# Patient Record
Sex: Female | Born: 1965 | Race: White | Hispanic: No | Smoking: Never smoker
Health system: Southern US, Community
[De-identification: ages and names within clinical notes are randomized; demographics above are authoritative.]

## PROBLEM LIST (undated history)

## (undated) DIAGNOSIS — R7303 Prediabetes: Secondary | ICD-10-CM

## (undated) DIAGNOSIS — L80 Vitiligo: Secondary | ICD-10-CM

## (undated) DIAGNOSIS — M7989 Other specified soft tissue disorders: Secondary | ICD-10-CM

## (undated) DIAGNOSIS — Z87442 Personal history of urinary calculi: Secondary | ICD-10-CM

## (undated) DIAGNOSIS — R51 Headache: Secondary | ICD-10-CM

## (undated) DIAGNOSIS — F329 Major depressive disorder, single episode, unspecified: Secondary | ICD-10-CM

## (undated) DIAGNOSIS — J302 Other seasonal allergic rhinitis: Secondary | ICD-10-CM

## (undated) DIAGNOSIS — K829 Disease of gallbladder, unspecified: Secondary | ICD-10-CM

## (undated) DIAGNOSIS — N200 Calculus of kidney: Secondary | ICD-10-CM

## (undated) DIAGNOSIS — M2550 Pain in unspecified joint: Secondary | ICD-10-CM

## (undated) DIAGNOSIS — E785 Hyperlipidemia, unspecified: Secondary | ICD-10-CM

## (undated) DIAGNOSIS — R519 Headache, unspecified: Secondary | ICD-10-CM

## (undated) DIAGNOSIS — I10 Essential (primary) hypertension: Secondary | ICD-10-CM

## (undated) DIAGNOSIS — G4730 Sleep apnea, unspecified: Secondary | ICD-10-CM

## (undated) DIAGNOSIS — F32A Depression, unspecified: Secondary | ICD-10-CM

## (undated) DIAGNOSIS — E559 Vitamin D deficiency, unspecified: Secondary | ICD-10-CM

## (undated) DIAGNOSIS — F419 Anxiety disorder, unspecified: Secondary | ICD-10-CM

## (undated) DIAGNOSIS — I209 Angina pectoris, unspecified: Secondary | ICD-10-CM

## (undated) DIAGNOSIS — J45909 Unspecified asthma, uncomplicated: Secondary | ICD-10-CM

## (undated) DIAGNOSIS — F99 Mental disorder, not otherwise specified: Secondary | ICD-10-CM

## (undated) DIAGNOSIS — R0602 Shortness of breath: Secondary | ICD-10-CM

## (undated) DIAGNOSIS — M549 Dorsalgia, unspecified: Secondary | ICD-10-CM

## (undated) DIAGNOSIS — K219 Gastro-esophageal reflux disease without esophagitis: Secondary | ICD-10-CM

## (undated) HISTORY — DX: Calculus of kidney: N20.0

## (undated) HISTORY — DX: Vitamin D deficiency, unspecified: E55.9

## (undated) HISTORY — PX: APPENDECTOMY: SHX54

## (undated) HISTORY — DX: Vitiligo: L80

## (undated) HISTORY — DX: Mental disorder, not otherwise specified: F99

## (undated) HISTORY — DX: Shortness of breath: R06.02

## (undated) HISTORY — DX: Dorsalgia, unspecified: M54.9

## (undated) HISTORY — DX: Pain in unspecified joint: M25.50

## (undated) HISTORY — DX: Disease of gallbladder, unspecified: K82.9

## (undated) HISTORY — DX: Other seasonal allergic rhinitis: J30.2

## (undated) HISTORY — DX: Essential (primary) hypertension: I10

## (undated) HISTORY — DX: Anxiety disorder, unspecified: F41.9

## (undated) HISTORY — PX: DILATION AND CURETTAGE OF UTERUS: SHX78

## (undated) HISTORY — PX: TUBAL LIGATION: SHX77

## (undated) HISTORY — DX: Sleep apnea, unspecified: G47.30

## (undated) HISTORY — DX: Other specified soft tissue disorders: M79.89

## (undated) HISTORY — DX: Unspecified asthma, uncomplicated: J45.909

---

## 2000-10-04 ENCOUNTER — Other Ambulatory Visit: Admission: RE | Admit: 2000-10-04 | Discharge: 2000-10-04 | Payer: Self-pay | Admitting: Obstetrics and Gynecology

## 2000-11-08 ENCOUNTER — Ambulatory Visit (HOSPITAL_COMMUNITY): Admission: RE | Admit: 2000-11-08 | Discharge: 2000-11-08 | Payer: Self-pay | Admitting: Obstetrics and Gynecology

## 2000-11-08 ENCOUNTER — Encounter: Payer: Self-pay | Admitting: Obstetrics and Gynecology

## 2001-06-26 ENCOUNTER — Ambulatory Visit (HOSPITAL_COMMUNITY): Admission: RE | Admit: 2001-06-26 | Discharge: 2001-06-26 | Payer: Self-pay | Admitting: Obstetrics and Gynecology

## 2002-07-07 ENCOUNTER — Inpatient Hospital Stay (HOSPITAL_COMMUNITY): Admission: EM | Admit: 2002-07-07 | Discharge: 2002-07-10 | Payer: Self-pay | Admitting: Psychiatry

## 2002-11-14 ENCOUNTER — Observation Stay (HOSPITAL_COMMUNITY): Admission: RE | Admit: 2002-11-14 | Discharge: 2002-11-14 | Payer: Self-pay | Admitting: Obstetrics and Gynecology

## 2002-11-14 ENCOUNTER — Encounter: Payer: Self-pay | Admitting: Obstetrics and Gynecology

## 2003-06-12 ENCOUNTER — Ambulatory Visit (HOSPITAL_COMMUNITY): Admission: AD | Admit: 2003-06-12 | Discharge: 2003-06-12 | Payer: Self-pay | Admitting: Obstetrics and Gynecology

## 2003-09-10 ENCOUNTER — Inpatient Hospital Stay (HOSPITAL_COMMUNITY): Admission: RE | Admit: 2003-09-10 | Discharge: 2003-09-12 | Payer: Self-pay | Admitting: Obstetrics and Gynecology

## 2005-10-18 ENCOUNTER — Inpatient Hospital Stay (HOSPITAL_COMMUNITY): Admission: RE | Admit: 2005-10-18 | Discharge: 2005-10-23 | Payer: Self-pay | Admitting: *Deleted

## 2005-10-19 ENCOUNTER — Ambulatory Visit: Payer: Self-pay | Admitting: *Deleted

## 2005-10-26 ENCOUNTER — Ambulatory Visit (HOSPITAL_COMMUNITY): Payer: Self-pay | Admitting: Psychiatry

## 2005-11-02 ENCOUNTER — Ambulatory Visit (HOSPITAL_COMMUNITY): Payer: Self-pay | Admitting: Psychiatry

## 2005-11-08 ENCOUNTER — Ambulatory Visit (HOSPITAL_COMMUNITY): Payer: Self-pay | Admitting: Psychiatry

## 2005-11-16 ENCOUNTER — Ambulatory Visit (HOSPITAL_COMMUNITY): Payer: Self-pay | Admitting: Psychiatry

## 2005-11-20 ENCOUNTER — Ambulatory Visit (HOSPITAL_COMMUNITY): Payer: Self-pay | Admitting: Psychiatry

## 2005-11-27 ENCOUNTER — Ambulatory Visit (HOSPITAL_COMMUNITY): Payer: Self-pay | Admitting: Psychiatry

## 2005-12-01 ENCOUNTER — Ambulatory Visit (HOSPITAL_COMMUNITY): Admission: RE | Admit: 2005-12-01 | Discharge: 2005-12-01 | Payer: Self-pay | Admitting: Obstetrics and Gynecology

## 2005-12-07 ENCOUNTER — Ambulatory Visit (HOSPITAL_COMMUNITY): Payer: Self-pay | Admitting: Psychiatry

## 2005-12-28 ENCOUNTER — Ambulatory Visit (HOSPITAL_COMMUNITY): Payer: Self-pay | Admitting: Psychiatry

## 2006-01-03 ENCOUNTER — Ambulatory Visit (HOSPITAL_COMMUNITY): Payer: Self-pay | Admitting: Psychiatry

## 2006-01-24 ENCOUNTER — Ambulatory Visit (HOSPITAL_COMMUNITY): Payer: Self-pay | Admitting: Psychiatry

## 2006-02-15 ENCOUNTER — Ambulatory Visit (HOSPITAL_COMMUNITY): Payer: Self-pay | Admitting: Psychiatry

## 2006-03-08 ENCOUNTER — Ambulatory Visit (HOSPITAL_COMMUNITY): Payer: Self-pay | Admitting: Psychiatry

## 2006-03-29 ENCOUNTER — Ambulatory Visit (HOSPITAL_COMMUNITY): Payer: Self-pay | Admitting: Psychiatry

## 2006-12-28 ENCOUNTER — Ambulatory Visit (HOSPITAL_COMMUNITY): Admission: RE | Admit: 2006-12-28 | Discharge: 2006-12-28 | Payer: Self-pay | Admitting: Obstetrics and Gynecology

## 2008-01-22 ENCOUNTER — Other Ambulatory Visit: Admission: RE | Admit: 2008-01-22 | Discharge: 2008-01-22 | Payer: Self-pay | Admitting: Obstetrics and Gynecology

## 2008-01-22 ENCOUNTER — Ambulatory Visit (HOSPITAL_COMMUNITY): Admission: RE | Admit: 2008-01-22 | Discharge: 2008-01-22 | Payer: Self-pay | Admitting: Obstetrics and Gynecology

## 2009-03-04 ENCOUNTER — Other Ambulatory Visit: Admission: RE | Admit: 2009-03-04 | Discharge: 2009-03-04 | Payer: Self-pay | Admitting: Obstetrics and Gynecology

## 2009-08-20 ENCOUNTER — Ambulatory Visit (HOSPITAL_COMMUNITY): Admission: RE | Admit: 2009-08-20 | Discharge: 2009-08-20 | Payer: Self-pay | Admitting: Obstetrics and Gynecology

## 2010-06-05 ENCOUNTER — Encounter: Payer: Self-pay | Admitting: Obstetrics and Gynecology

## 2010-09-30 NOTE — Discharge Summary (Signed)
Heidi Payne, Heidi Payne NO.:  [id number]   MEDICAL RECORD NO.:  [id number]          PATIENT TYPE:  IPS   LOCATION:  0502                          [facility]ACILITY[facility]  [facility]   PHYSICIAN:  Jasmine Pang, M.D. DATE OF BIRTH:  November 01, 1965   DATE OF ADMISSION:  10/18/2005  DATE OF DISCHARGE:  10/23/2005                                 DISCHARGE SUMMARY   IDENTIFICATION:  The patient is a 45 year old married Caucasian female who  was admitted on an involuntary basis on October 18, 2005.   HISTORY OF PRESENT ILLNESS:  The patient had a history of an intentional  overdose.  She then wanted to jump off a bridge.  She overdosed on 11 Xanax  (0.25 mg size).  The chart indicates that she climbed over the railing and  was hanging from the bridge.  She states she lost her nerve and came back  over.  She then went down below the bridge and laid down.  She had left a  suicide note from her family.  The police were called and found the patient  out walking on the side of the road.  The trigger was patient lost job prior  to admission due to their discovery of her embezzlement of some money.  Her  husband got very upset and was agitated with her.  He told her he would  leave her, according to patient.  At this point, she felt suicidal.   PAST PSYCHIATRIC HISTORY:  This is the second admission here.  She was here  approximately three years ago.  She has had no previous history of suicide  attempt.   ALCOHOL/DRUG HISTORY:  The patient is a nonsmoker.  She does not use alcohol  or drugs.   MEDICAL HISTORY:  The patient had a history of dyslipidemia.   MEDICATIONS:  She is on Xanax 0.25 mg p.o. p.r.n. and Celexa 20 mg daily.   ALLERGIES:  She has no known drug allergies.   ADMISSION DIAGNOSIS:  Depression not otherwise specified.   PHYSICAL EXAMINATION:  These were done at [facility].  Please see their report.  There were no acute abnormal findings and she was  physically  stable.   LABORATORY DATA:  These were done at [facility].  Please see  their report.  There were no acute abnormal findings and she was physically  stable.   HOSPITAL COURSE:  Upon admission, the patient was continued on her Celexa 20  mg q.d. and Vytorin 10/20 mg daily.  Upon admission, the patient was started  on Ambien 10 mg p.o. p.r.n.  On October 19, 2005, she was started on Librium 25  mg now, q.6h. p.r.n. and Celexa 20 mg daily as indicated above.  On October 20, 2005, she was started on Motrin 400 mg p.o. q.6h. p.r.n. pain.  On October 21, 2005, Celexa was increased to 40 mg daily to start the following day.   Upon my first meeting with patient, on October 19, 2005, she stated she was here  for suicidal ideation and depression.  She was suspended from her job after  having embezzled some money.  She had hoped she could go back to work but  was recently told she had lost her job.  She stated her husband was very  upset and they argued.  She became increasingly suicidal and, at 4 a.m.,  walked to the bridge and almost jumped off.  She had taken 10 Xanax pills  also.  On October 20, 2005, the patient continued to be depressed and ashamed  about her situation.  She states she was going to tell her supervisor about  the embezzlement but he found out first.  She feels she has let her  supervisor down because she was always treated so well by him.  She also  feels ashamed at her husband knowing what she did.  She was heartened by the  fact that her supervisor put her back on the payroll and did not let her go  so she could apply for the disability benefits.  This improved her mood  somewhat.  She stated her mother was in the hospital now for severe asthma  and other medical problems.  On October 21, 2005, the patient's mood continued  to improve.  She was previously on Lexapro but had to change for financial  reasons to Celexa.  She reported no more suicidal thoughts but continued to  have  anxiety and depressive symptoms.  On October 22, 2005, the patient stated  she was feeling much better.  There was no suicidal thoughts in the past 24  hours.  She and her husband were ready to cope with their financial  situation.  The patient feels positive about the future and a relationship  with him.   On October 22, 2005, the patient and patient's husband and the patient's  daughter met with our social worker for a family session.  The main concern  addressed was stress reduction and continuing care.  The patient requested  to attend an IOP and spoke of one in [place] close to her home.  She  spoke about the need for counseling, to continue to look at her childhood  issues and help her reduce stress and increase her self-esteem.  She  requested that, if placement in an IOP in [place] cannot be made, she be  referred to a therapist that she can see 2-3 times a week.  The patient does  not currently have a doctor for medication management and will need a  referral for one in her area.  She stated that she felt good on her current  medications and felt the medications were helping.  The patient's husband  and daughter discussed a plan for the patient to go to a neighbor's house  for most of the day the first few weeks so she would not be alone.  The  patient spoke highly of this neighbor and stated she could talk to her if  she was feeling overwhelmed.  The patient and patient's husband also spoke  about a plan to work on their communication skills which was one of the  triggers leading up to the patient's admittance.  They denied referral for  family and marriage counseling but stated a detailed plan to increase  communication that involved taking time-outs, paraphrasing each other and  not letting issues remain unsolved.  The family appeared to be very close  and supportive and patient spoke about feeling like she could go home the following day.   On October 23, 2005, the patient's  mental status had improved  markedly.  She  had no psychomotor retardation.  Eye contact was good.  She was friendly and  cooperative with speech normal rate and flow.  The patient was less  depressed and anxious.  There was no suicidal ideation.  No self-injurious  behavior.  No auditory or visual hallucinations.  No paranoia or delusions.  Thoughts were logical and goal directed.  Thought content with no  predominant theme.  Cognitive exam was grossly within normal limits.  She  felt heartened by her family session and felt her husband was very  supportive.  He was planning to pick her up today.  She will go home to live  with her family.   DISCHARGE DIAGNOSES:  AXIS I:  Major depression, recurrent, severe without  psychosis.  AXIS II:  None.  AXIS III:  Hyperlipidemia.  AXIS IV:  Severe (economic problem, occupational problem, problems related  to the social environment, problems related to legal system if they press  charges for the embezzlement).  AXIS V:  GAF upon discharge 40; GAF upon admission 28; GAF highest past year  65.   ACTIVITY/DIET:  There were no specific activity level or dietary  restrictions.   DISCHARGE MEDICATIONS:  1.  Celexa (citalopram) 40 mg daily.  2.  Vytorin 10/20 mg daily.  3.  Ambien 10 mg p.o. q.h.s.   POST-HOSPITAL CARE PLANS:  The patient will be followed up by Dr. Lolly Mustache at  the [facility] in [place].  She has  an appointment on Thursday, November 16, 2005 at 10:45 a.m.  She will also be  followed by Florencia Reasons on Thursday, October 26, 2005 at 9 a.m.      Jasmine Pang, M.D.  Electronically Signed     BHS/MEDQ  D:  11/07/2005  T:  11/07/2005  Job:  [id number]

## 2010-09-30 NOTE — Discharge Summary (Signed)
NAME:  Heidi Payne, Heidi Payne                     ACCOUNT NO.:  [id number]   MEDICAL RECORD NO.:  [id number]                   PATIENT TYPE:  INP   LOCATION:  A409                                 FACILITY:  [facility]   PHYSICIAN:  [name], M.D.                DATE OF BIRTH:  1965/08/15   DATE OF ADMISSION:  DATE OF DISCHARGE:  09/11/2001                                 DISCHARGE SUMMARY   DISCHARGE DIAGNOSES:  1. Status post primary cesarean section.  2. Desires sterilization.  3. Unremarkable postoperative course.   PROCEDURES:  Primary cesarean section with tubal ligation.   Please refer to the transcribed history and physical and the operative  report for details of admission to the hospital.   HOSPITAL COURSE:  The patient had an unremarkable postoperative course.  She  tolerated clear liquids and a regular diet.  She voided without symptoms and  was ambulatory.  Her incision was clean, dry and intact.  On postoperative  day #1, hemoglobin was 12, hematocrit was 35.5.  White count was 7200.  She  tolerated clear liquids, regular diet, voiding without symptoms, and was  ambulatory.  She is discharged to home on the morning of postoperative day  #2 in stable condition.   FOLLOWUP:  In the office on Wednesday, May 5, for staple removal.   DISCHARGE INSTRUCTIONS:  She was given Tylox and Motrin for pain and  instructions and precautions for return prior to that time.     ___________________________________________                                         [name], M.D.   Loraine Maple  D:  09/12/2003  T:  09/12/2003  Job:  [id number]

## 2010-09-30 NOTE — Op Note (Signed)
[facility]  Patient:    Heidi Payne, Heidi Payne Visit Number: [id number] MRN: [id number]          Service Type: DSU Location: DAY Attending Physician:  [name] Dictated by:   Christin Bach, M.D. Proc. Date: 06/26/01 Admit Date:  06/26/2001 Discharge Date: 06/26/2001                             Operative Report  ADDENDUM TO JOB #[id number]  Patients blood type is AB-.  Husband is Rh negative as well.  Review of the chart indicates that this was confirmed at the time of the surgical procedure. RhoGAM was therefore not indicated and not given. Dictated by:   Christin Bach, M.D. Attending Physician:  [name] DD:  07/14/01 TD:  07/15/01 Job: [id number]

## 2010-09-30 NOTE — Discharge Summary (Signed)
NAME:  Heidi Payne, Heidi Payne                     ACCOUNT NO.:  [id number]   MEDICAL RECORD NO.:  [id number]                   PATIENT TYPE:  IPS   LOCATION:  0505                                 [facility]ILITY[facility]   PHYSICIAN:  Jeanice Lim, M.D.              DATE OF BIRTH:  Dec 06, 1965   DATE OF ADMISSION:  07/07/2002  DATE OF DISCHARGE:  07/10/2002                                 DISCHARGE SUMMARY   IDENTIFYING DATA:  This is a 45 year old Caucasian female, married,  voluntarily admitted, receptionist.  Referred by her physician for suicidal  thoughts with plan to overdose.  The patient has a 48 year old daughter.  The patient wanted a second child but husband would not agree.  The patient  is grieving over the loss of a pregnancy and was quite upset that her  husband was not willing to have another child and could not get over this.   MEDICATIONS:  Lexapro, oral contraceptives.   ALLERGIES:  No known drug allergies.   PHYSICAL EXAMINATION:  Essentially within normal limits.  Neurologically  nonfocal.   LABORATORY DATA:  Routine admission labs essentially within normal limits.   MENTAL STATUS EXAM:  Healthy, fully alert female with blunted affect,  psychomotor slowing.  Speech within normal limits.  Mood depressed.  Affect  restricted.  Thought processes goal directed.  Thought content negative for  dangerous ideation or psychotic symptoms.   ADMISSION DIAGNOSES:   AXIS I:  Major depressive disorder, recurrent, severe.   AXIS II:  None.   AXIS III:  Urinary tract infection.   AXIS IV:  Moderate (problems with primary support group).   AXIS V:  30/70.   HOSPITAL COURSE:  The patient was admitted and ordered routine p.r.n.  medications and underwent further monitoring.  Was encouraged to participate  in individual, group and milieu therapy.  Possible UTI was treated with  Septra and patient was started on trazodone to restore sleep and Xanax  p.r.n. anxiety.  The  patient was quite relieved when husband agreed that  they could work on having another child.  The patient reported improvement  in mood and no longer was suicidal.  The patient's ability to cope with  stress and disappointment was discussed and she was motivated to work on  healthier coping skills for future stressors and disappointments.   CONDITION ON DISCHARGE:  Markedly improved.  Mood was more euthymic.  Affect  brighter.  Thought processes goal directed.  Thought content negative for  dangerous ideation or psychotic symptoms.   DISCHARGE MEDICATIONS:  1. Lexapro 20 mg q.a.m.  2. Bactrim DS b.i.d. for two days.  3. Trazodone 50 mg q.h.s.  4. Ambien 10 mg q.h.s. p.r.n. insomnia.  5. Xanax 0.25 mg q.6h. p.r.n. anxiety.   FOLLOW UP:  The patient was to follow up with Leeanne Mannan, therapist, on  July 14, 2002 at 11 a.m. and Dr. Lourdes Sledge on July 17, 2002 at  2:15 p.m.   DISCHARGE DIAGNOSES:   AXIS I:  Major depressive disorder, recurrent, severe.   AXIS II:  None.   AXIS III:  Urinary tract infection.   AXIS IV:  Moderate (problems with primary support group).   AXIS V:  Global Assessment of Functioning on discharge 55.                                                Jeanice Lim, M.D.    JEM/MEDQ  D:  07/21/2002  T:  07/22/2002  Job:  [id number]

## 2010-09-30 NOTE — Op Note (Signed)
[facility]  Patient:    Heidi Payne, Heidi Payne Visit Number: [id number] MRN: [id number]          Service Type: DSU Location: DAY Attending Physician:  [name] Dictated by:   Christin Bach, M.D. Proc. Date: 06/26/01 Admit Date:  06/26/2001 Discharge Date: 06/26/2001                             Operative Report  PREOPERATIVE DIAGNOSES:  Missed abortion.  POSTOPERATIVE DIAGNOSES:  Missed abortion.  PROCEDURE:  Suction dilatation and curettage.  SURGEON:  Dr. Emelda Fear  ASSISTANT:  None.  ANESTHESIA:  General LMA.  COMPLICATIONS:  None.  FINDINGS:  Uterus sounded to 10 cm.  DETAILS OF PROCEDURE:  Patient was taken to the operating room, anesthetized, prepped and draped for vaginal procedure.  The speculum was inserted.  Cervix prepped.  Single tooth tenaculum used to grasp the cervix which was sounded to a depth of 10 cm, dilated to 29 [place] allowing introduction of a 9 mm curved suction curette which resulted in circumferential suction curettage removing appropriate blood, tissue, and fluid consistent with missed AB.  Patients blood type was confirmed as AB- and RhoGAM authorized.  The patient went to recovery room in good condition. Dictated by:   Christin Bach, M.D. Attending Physician:  [name] DD:  07/14/01 TD:  07/15/01 Job: [id number]

## 2010-09-30 NOTE — H&P (Signed)
[name], [name] NO.:  [id number]   MEDICAL RECORD NO.:  [id number]          PATIENT TYPE:  IPS   LOCATION:  0502                          [facility]ILITY[facility]   PHYSICIAN:  Heidi Payne, M.D. DATE OF BIRTH:  May 18, 1965   DATE OF ADMISSION:  10/18/2005  DATE OF DISCHARGE:                         PSYCHIATRIC ADMISSION ASSESSMENT   IDENTIFYING INFORMATION:  The patient is a 45 year old married white female  involuntarily committed on October 18, 2005.   HISTORY OF PRESENT ILLNESS:  The patient presents here on petition with a  history of intentional overdose and, after overdosing, wanted to jump off a  bridge.  The patient states she overdosed on 11 Xanax 0.25 mg.  Petition  papers state that the patient crossed over the railing of the bridge, was  hanging on, lost her nerve and came back over.  The patient states that she  did overdose in the middle of the night.  She left a note for her family.  She states that, after she overdosed, she had laid by the bridge.  They were  unable to find her for several hours.  Police were called and found the  patient walking back alongside the road.  The patient states that her  trigger was that she lost her job due to embezzlement.  The patient was  working in a [employer], trade [employer].  She stated her husband got very upset at her  about what had happened.  She did not confide in him and stating he would  leave her, again, which was the trigger for her overdose.   PAST PSYCHIATRIC HISTORY:  Second visit to [facility].  Was  here about three years ago.  No history of any prior suicide attempts.   SOCIAL HISTORY:  The patient is a 45 year old married, white female.  Has  two children, age 54 and 2.  Finished 14 years of school.  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  No current legal charges due  to the embezzlement.  States that she believes all she will have to do is  pay back the money.   FAMILY  HISTORY:  Denies.   ALCOHOL/DRUG HISTORY:  Nonsmoker.  Denies any alcohol or drug use.   PRIMARY CARE PHYSICIAN:  Dr. Dimas Aguas.   MEDICAL PROBLEMS:  Hyperlipidemia.   MEDICATIONS:  Xanax 0.25 mg p.r.n., Celexa 20 mg and Vytorin 10/20 mg daily.   PHYSICAL EXAMINATION:  The patient was fully assessed at [facility].  She is a middle-aged female.  Appears in no acute distress.  Well-nourished.  Temperature 97.9, heart rate 90, respirations 14, blood pressure 119/77,  height 5 feet 5-1/2 inches tall, weight 155 pounds.   LABORATORY DATA:  Urine drug screen was positive for benzodiazepines.  Alcohol level less than 5.  CBC within normal limits.  Alcohol level less  than 5.  Salicylate level less than 4.  Acetaminophen level less than 10.  Pregnancy test is negative.  Potassium is 3.4.   MENTAL STATUS EXAM:  Middle-aged female, casually dressed, neat in  appearance.  Cooperative.  Fair eye contact.  Speech is  clear, normal rate  and tone.  The patient feels guilty.  The patient is sad, very pleasant.  Thought processes are coherent.  No evidence of psychosis.  Cognitive  function intact.  Memory is good.  Judgment is fair.  Insight is fair.  She  appears sincere.   DIAGNOSES:  AXIS I:  Depressive disorder not otherwise specified.  AXIS II:  Deferred.  AXIS III:  Hyperlipidemia, status post Xanax overdose.  AXIS IV:  Problems with primary support group, occupation and economic  issues.  AXIS V:  Current 30.   PLAN:  To stabilize mood and thinking.  Will have Librium available p.r.n.  for withdrawal symptoms.  Will have a family session with husband.  Will  continue with her Celexa.   TENTATIVE LENGTH OF STAY:  Four to six days.      Heidi Payne, N.P.      Heidi Payne, M.D.  Electronically Signed    JO/MEDQ  D:  10/20/2005  T:  10/20/2005  Job:  [id number]

## 2010-09-30 NOTE — H&P (Signed)
Heidi Payne, Heidi Payne                     ACCOUNT NO.:  [id number]   MEDICAL RECORD NO.:  [id number]                   PATIENT TYPE:  IPS   LOCATION:  0505                                 FACILITY:  [facility]   PHYSICIAN:  Jeanice Lim, M.D.              DATE OF BIRTH:  November 25, 1965   DATE OF ADMISSION:  07/07/2002  DATE OF DISCHARGE:  07/10/2002                         PSYCHIATRIC ADMISSION ASSESSMENT   DATE OF ASSESSMENT:  July 08, 2002   PATIENT IDENTIFICATION:  This is a 45 year old white female who is married,  voluntary admission.   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  She has a 24 year old daughter and wants a  second child but her husband will not agree.  She had a miscarriage  approximately one year ago and is grieving the loss of that pregnancy.  Her  husband had promised her that even though she had miscarried that previous  pregnancy, he would help her get pregnant again and then after she recovered  from the miscarriage, he denied her wishes.  The patient reports that she  has become gradually depressed with symptoms worse in the past two weeks.  She sleeps constantly when she is not working and tends to spend all day or  all weekend in the bed just lying around.  At the same time, she feels that  her concentration is poor.  She has episodes of anxiety with mild agitation  and anger when she is around her husband.  She denies any overt panic  attacks or auditory or visual hallucinations, denies any feelings of  paranoia.  Over the last one to two weeks, she has entertained thoughts of  possibly killing herself by overdosing on medications.  She denies any  homicidal ideation.   PAST PSYCHIATRIC HISTORY:  The patient was seen once with a counselor but  had not psychiatric treatment in the past.  This is her first  inpatient  admission.  She was treated once for depression in 1998 with Paxil, which,  she reports, gave her good results but caused considerable weight gain.  She  has no history of mania, mood fluctuation, or prior suicide attempts.   SUBSTANCE ABUSE HISTORY:  The patient denies any history of substance abuse,  has no evidence of substance abuse.   PAST MEDICAL HISTORY:  The patient is followed by [name], M.D.,  who is her OB/GYN in [place].  Medical problems: She denies.  She reports  that she is healthy.   MEDICATIONS:  1. Lexapro 20 mg daily.  2. The patient takes oral contraceptive tablets.   DRUG ALLERGIES:  None.  The patient is allergic to cats, which causes  anaphylaxis.   REVIEW OF SYSTEMS:  Review of systems is remarkable for a dull headache,  which she scores as a 3-4 on  scale of 10.  She has had some episodic left  substernal chest pain since she has been admitted here to the hospital,  although she does not have it currently.  She has had it in the past with a  negative cardiac workup.   PHYSICAL EXAMINATION:  GENERAL:  This is a healthy appearing female who  appears to be her stated age of 55.  VITAL SIGNS:  On admission to the unit, temperature 97.3, pulse 92,  respirations 20, blood pressure 136/89 sitting, 143/84 standing.  She is 5  feet 7 inches tall approximately and weighs about 164 pounds.  SKIN:  Her skin is pale in tone with no remarkable features, no tattoos or  remarkable scars.  HEENT:  Head is normocephalic and atraumatic.  Grooming is satisfactory.  EENT: PERRLA.  Sclerae are nonicteric.  Lenses appear clear.  No funduscopic  done.  Hearing intact to normal voice.  No rhinorrhea.  Oropharynx: In good  condition.  NECK:  Supple, no thyromegaly.  CARDIOVASCULAR:  Heart sounds are grossly normal.  No extra sounds  appreciated.  Apical pulse is synchronous with radial pulse.  Regular rate.  EXTREMITIES:  Extremities are pink and warm with  good capillary refill, no  evidence of edema.  LUNGS:  Clear to auscultation.  ABDOMEN:  Slightly rounded, soft, nontender, no masses appreciated.  GENITALIA:  Deferred.  BREAST:  Exam is deferred.  MUSCULOSKELETAL:  No swelling or erythema of any joints.  NEUROLOGIC:  Cranial nerves II-XII are intact.  Facial symmetry is present.  Grip strength is equal bilaterally.  EOM are intact without nystagmus.  Gait  is smooth.  Motor movements are smooth without tremor.  Sensory is grossly  intact.  Romberg: Without findings.  No focal findings.   LABORATORY DATA:  Diagnostic studies reveal the patient with cloudy urine  with 3-6 wbc's per high power field along with trace blood and ketones.  Her  CBC is within normal limits with a hemoglobin 14.1, hematocrit 41.3, WBC  4800, MCV 85.9, platelets 246,000.  Routine chemistry reveals normal  electrolytes, BUN 12, creatinine 0.9.  Liver enzymes are within normal  limits.   SOCIAL HISTORY:  The patient has been married for the past eighteen years  and has a 15 year old daughter.  She lives in [place] with her husband and her  daughter.  There is some financial stress but nothing that she feels is  significant.  She endorses chronic marital conflict around the issues of  having second child.  She denies any legal problems.   FAMILY HISTORY:  Family history is unremarkable.   MENTAL STATUS EXAM:  This is a healthy appearing female who is fully with a  blunted and tearful affect with some very mild psychomotor slowing but  otherwise a normal motor exam.  She is cooperative, pleasant, and polite.  Speech is normal but somewhat soft in tone but within normal limits.  She is  articulate, spontaneous.  Mood is depressed and somewhat hopeless about the  conflict with her husband.  Thought process is logical, goal directed, and  is positive for suicidal ideation with a possible plan to overdose on medications but no clear intent at this point; no evidence  of homicidal  ideation.  Although she is quite frustrated with her husband, she is clear  that she loves him and just wants to resolve their conflict.  Cognitive:  Intact and oriented x 3.  Intelligence is above average.  Judgment and  impulse control:  Within normal limits.  Insight: Satisfactory.   ADMISSION DIAGNOSES:   AXIS I:  Major depressive disorder, recurrent, severe.   AXIS II:  No diagnosis.   AXIS III:  Urinary tract infection.   AXIS IV:  Severe marital conflict.   AXIS V:  Current 34, past year 64.   INITIAL PLAN OF CARE:  Plan is to voluntarily admit the patient to alleviate  her depression with the goal of alleviating her suicidal ideation and  improving her sleep-wake cycle and her energy and alleviating her anhedonia.  We are going to get a thyroid panel and will add a pregnancy test to that.  Along with that, because she has had some episodes of chest pain, we will  get an EKG and do cardiac enzymes to rule out any problems since she does  have some persistent, intermittent chest pain.  We will place her on Septra  DS p.o. b.i.d. for five days for her UTI and start her on Xanax 0.25 mg  q.6.h. p.r.n. for her depression and anxiety.  We will continue her on  Lexapro 20 mg p.o. q.a.m. and will add trazodone 50 mg p.o. q.h.s.  We have  discussed the risks and benefits of the plan.  She has asked some pertinent  questions and is in agreement.  She has been participating satisfactorily in  intensive group and individual psychotherapy.  We will also plan on  scheduling a marital session with her husband as soon as that becomes  practical.     Young Berry. Stephannie Peters                   Jeanice Lim, M.D.    MAS/MEDQ  D:  07/22/2002  T:  07/22/2002  Job:  [id number]

## 2010-09-30 NOTE — Op Note (Signed)
NAME:  Heidi Payne, Heidi Payne                       ACCOUNT NO.:  [id number]   MEDICAL RECORD NO.:  [id number]                  PATIENT TYPE:   LOCATION:                                       FACILITY:   PHYSICIAN:  [name], M.D.              DATE OF BIRTH:   DATE OF PROCEDURE:  DATE OF DISCHARGE:                                 OPERATIVE REPORT   PREOPERATIVE DIAGNOSES:  1. Pregnancy [redacted] weeks gestation, early labor.  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   POSTOPERATIVE DIAGNOSES:  1. Pregnancy [redacted] weeks gestation, early labor.  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   PROCEDURE:  1. Primary low transverse cervical cesarean section.  2. Bilateral partial salpingectomy.   SURGEON:  [name], M.D.   ASSISTANT:  [name], M.D.   ANESTHESIA:  Spinal x2; [name], CRNA   COMPLICATIONS:  None.   FINDINGS:  A healthy, 7 pound 11 ounce female infant, [name], Apgars  9 and 9.   INDICATIONS:  A 45 year old female presenting with membrane rupture at 6  a.m. on the 28th; presenting to labor and delivery with active labor.  She  has a history of endolymphatic fistula developed after coughing paroxysms in  pregnancy which recovered, but due to ears, nose, and throat reconnections  she is scheduled for cesarean delivery instead of being allowed to push  through second stage labor.   DETAILS OF PROCEDURE:  The patient was taken to the operating room prepped  and draped for lower abdominal surgery.  It required 2 spinal attempts to  achieve adequate analgesia.  A Pfannenstiel-type incision was performed with  excision of approximately 2-cm wide strip of skin and underlying fatty  tissue. A Pfannenstiel-type of incision was repeated without difficulty.  Midline anterior to the peritoneal cavity performed and bladder flap  developed on the lower uterine segment which was well-developed.  A  transverse uterine incision was made, extended laterally using index finger  traction and fetal vertex rotated into the incision and delivered with  fundal pressure application by [name].  Bulb suctioning of the pharynx was  followed by cord clamping.  Cord blood gases were obtained.  The infant was  cared for by [name] whose notes are dictated elsewhere.  The  infant [name] went to nursery, upstairs.   We proceeded with delivery of the placenta after completion of cord blood  gas samplings.  We irrigated the uterus with antibiotic containing solution,  then did a single layer of running locking closure of the uterine incision  followed by 2-0 chromic closure of the bladder flap. The abdomen was  irrigated.  The anterior peritoneum closed with 2-0 chromic, the fascia  closed with 2-0 Vicryl, the subcu fatty tissue reapproximated with 2-0 plain  and staple closure of the skin completed the procedure with sponge and  needle  counts correct. Maternal blood type is Rh negative and RhoGAM need  will be assessed in the recovery room.      ___________________________________________                                            [name], M.D.   JVF/MEDQ  D:  09/10/2003  T:  09/10/2003  Job:  [id number]   cc:   [name], M.D.  [address] [place]  [place] [address]  Fax: [phone]   [name], M.D.  [address]  [place]  [place] [address]  Fax: [phone]

## 2010-12-19 ENCOUNTER — Other Ambulatory Visit (HOSPITAL_COMMUNITY): Payer: Self-pay | Admitting: Family Medicine

## 2010-12-19 DIAGNOSIS — Z139 Encounter for screening, unspecified: Secondary | ICD-10-CM

## 2011-01-05 ENCOUNTER — Ambulatory Visit (HOSPITAL_COMMUNITY)
Admission: RE | Admit: 2011-01-05 | Discharge: 2011-01-05 | Disposition: A | Discharge status: Home / Self Care | Payer: Managed Care, Other (non HMO) | Source: Ambulatory Visit | Attending: Family Medicine | Admitting: Family Medicine

## 2011-01-05 DIAGNOSIS — Z1231 Encounter for screening mammogram for malignant neoplasm of breast: Secondary | ICD-10-CM | POA: Insufficient documentation

## 2011-01-05 DIAGNOSIS — Z139 Encounter for screening, unspecified: Secondary | ICD-10-CM

## 2011-03-06 ENCOUNTER — Other Ambulatory Visit (HOSPITAL_COMMUNITY)
Admission: RE | Admit: 2011-03-06 | Discharge: 2011-03-06 | Disposition: A | Discharge status: Home / Self Care | Payer: Managed Care, Other (non HMO) | Source: Ambulatory Visit | Attending: Obstetrics and Gynecology | Admitting: Obstetrics and Gynecology

## 2011-03-06 ENCOUNTER — Other Ambulatory Visit: Payer: Self-pay | Admitting: Adult Health

## 2011-03-06 DIAGNOSIS — Z01419 Encounter for gynecological examination (general) (routine) without abnormal findings: Secondary | ICD-10-CM | POA: Insufficient documentation

## 2011-11-23 ENCOUNTER — Encounter (HOSPITAL_COMMUNITY): Payer: Self-pay

## 2011-11-23 ENCOUNTER — Encounter (HOSPITAL_COMMUNITY)
Admission: RE | Admit: 2011-11-23 | Discharge: 2011-11-23 | Disposition: A | Discharge status: Home / Self Care | Payer: Managed Care, Other (non HMO) | Source: Ambulatory Visit | Attending: General Surgery | Admitting: General Surgery

## 2011-11-23 HISTORY — DX: Hyperlipidemia, unspecified: E78.5

## 2011-11-23 HISTORY — DX: Gastro-esophageal reflux disease without esophagitis: K21.9

## 2011-11-23 LAB — BASIC METABOLIC PANEL
Chloride: 100 mEq/L (ref 96–112)
Creatinine, Ser: 0.82 mg/dL (ref 0.50–1.10)
GFR calc Af Amer: >90 mL/min (ref >90)
Potassium: 3.7 mEq/L (ref 3.5–5.1)
Sodium: 139 mEq/L (ref 135–145)

## 2011-11-23 LAB — HEPATIC FUNCTION PANEL
ALT: 26 U/L (ref 0–35)
Bilirubin, Direct: <0.1 mg/dL (ref 0.0–0.3)
Total Protein: 6.8 g/dL (ref 6.0–8.3)

## 2011-11-23 LAB — CBC
HCT: 40.9 % (ref 36.0–46.0)
RBC: 4.92 MIL/uL (ref 3.87–5.11)
RDW: 12.8 % (ref 11.5–15.5)
WBC: 7.3 10*3/uL (ref 4.0–10.5)

## 2011-11-23 LAB — DIFFERENTIAL
Basophils Absolute: 0.1 10*3/uL (ref 0.0–0.1)
Lymphocytes Relative: 23 % (ref 12–46)
Lymphs Abs: 1.7 10*3/uL (ref 0.7–4.0)
Monocytes Absolute: 0.4 10*3/uL (ref 0.1–1.0)
Neutro Abs: 4.9 10*3/uL (ref 1.7–7.7)

## 2011-11-23 LAB — SURGICAL PCR SCREEN: MRSA, PCR: NEGATIVE

## 2011-11-23 MED ORDER — CHLORHEXIDINE GLUCONATE 4 % EX LIQD
1.0000 "application " | Freq: Once | CUTANEOUS | Status: DC
  Filled 2011-11-23: qty 15

## 2011-11-23 NOTE — Patient Instructions (Addendum)
Your procedure is scheduled on: 11/27/2011                Report to [facility] at  8:30   Call this number if you have problems the morning of surgery: [phone]   Remember:   Do not drink or eat food:After Midnight.    Clear liquids include soda, tea, black coffee, apple or grape juice, broth.  Take these medicines the morning of surgery with A SIP OF WATER:    Do not wear jewelry, make-up or nail polish.  Do not wear lotions, powders, or perfumes. You may wear deodorant.  Do not shave 48 hours prior to surgery.  Do not bring valuables to the hospital.  Contacts, dentures or bridgework may not be worn into surgery.  Leave suitcase in the car. After surgery it may be brought to your room.  For patients admitted to the hospital, checkout time is 11:00 AM the day of discharge.   Patients discharged the day of surgery will not be allowed to drive home.  Name and phone number of your driver:   Special Instructions: CHG Shower use special wash: 1/2 bottle night before surgery and 1/2 bottle morning of surgery.   Please read over the following fact sheets that you were given: Pain Booklet, MRSA  Surgical Site Infection Prevention, Anesthesia Post-op Instructions and Care and Recovery After Surgery    Instructions Following General Anesthetic, Adult A nurse specialized in giving anesthesia (anesthetist) or a doctor specialized in giving anesthesia (anesthesiologist) gave you a medicine that made you sleep while a procedure was performed. For as long as 24 hours following this procedure, you may feel:  Dizzy.   Weak.   Drowsy.  AFTER THE PROCEDURE After surgery, you will be taken to the recovery area where a nurse will monitor your progress. You will be allowed to go home when you are awake, stable, taking fluids well, and without complications. For the first 24 hours following an anesthetic:  Have a responsible person with you.   Do not drive a car. If you are alone, do not take  public transportation.   Do not drink alcohol.   Do not take medicine that has not been prescribed by your caregiver.   Do not sign important papers or make important decisions.   You may resume normal diet and activities as directed.   Change bandages (dressings) as directed.   Only take over-the-counter or prescription medicines for pain, discomfort, or fever as directed by your caregiver.  If you have questions or problems that seem related to the anesthetic, call the hospital and ask for the anesthetist or anesthesiologist on call. SEEK IMMEDIATE MEDICAL CARE IF:   You develop a rash.   You have difficulty breathing.   You have chest pain.   You develop any allergic problems.  Document Released: 08/07/2000 Document Revised: 04/20/2011 Document Reviewed: 03/18/2007 [facility] Patient Information 2012 [place], [place].  Laparoscopic Cholecystectomy Care After Refer to this sheet in the next few weeks. These instructions provide you with information on caring for yourself after your procedure. Your caregiver may also give you more specific instructions. Your treatment has been planned according to current medical practices, but problems sometimes occur. Call your caregiver if you have any problems or questions after your procedure. HOME CARE INSTRUCTIONS   Change bandages (dressings) as directed by your caregiver.   Keep the wound dry and clean. The wound may be washed gently with soap and water. Gently blot or dab  the area dry.   Do not take baths or use swimming pools or hot tubs for 10 days, or as instructed by your caregiver.   Only take over-the-counter or prescription medicines for pain, discomfort, or fever as directed by your caregiver.   Continue your normal diet as directed by your caregiver.   Do not lift anything heavier than 25 pounds (11.5 kg), or as directed by your caregiver.   Do not play contact sports for 1 week, or as directed by your caregiver.  SEEK  MEDICAL CARE IF:   There is redness, swelling, or increasing pain in the wound.   You notice yellowish-white fluid (pus) coming from the wound.   There is drainage from the wound that lasts longer than 1 day.   There is a bad smell coming from the wound or dressing.   The surgical cut (incision) breaks open.  SEEK IMMEDIATE MEDICAL CARE IF:   You develop a rash.   You have difficulty breathing.   You develop chest pain.   You develop any reaction or side effects to medicines given.   You have a fever.   You have increasing pain in the shoulders (shoulder strap areas).   You have dizzy episodes or faint while standing.   You develop severe abdominal pain.   You feel sick to your stomach (nauseous) or throw up (vomit) and this lasts for more than 1 day.  MAKE SURE YOU:   Understand these instructions.   Will watch your condition.   Will get help right away if you are not doing well or get worse.  Document Released: 05/01/2005 Document Revised: 04/20/2011 Document Reviewed: 10/14/2010 [facility] Patient Information 2012 [place], [place].

## 2011-11-23 NOTE — Patient Instructions (Signed)
Your procedure is scheduled on:  11/27/2011  Report to [facility] at  8:30     AM.  Call this number if you have problems the morning of surgery: [phone]   Remember:   Do not drink or eat food:After Midnight.    Clear liquids include soda, tea, black coffee, apple or grape juice, broth.  Take these medicines the morning of surgery with A SIP OF WATER:    Do not wear jewelry, make-up or nail polish.  Do not wear lotions, powders, or perfumes. You may wear deodorant.  Do not shave 48 hours prior to surgery.  Do not bring valuables to the hospital.  Contacts, dentures or bridgework may not be worn into surgery.  Leave suitcase in the car. After surgery it may be brought to your room.  For patients admitted to the hospital, checkout time is 11:00 AM the day of discharge.   Patients discharged the day of surgery will not be allowed to drive home.  Name and phone number of your driver:   Special Instructions: CHG Shower use special wash: 1/2 bottle night before surgery and 1/2 bottle morning of surgery.   Please read over the following fact sheets that you were given: Pain Booklet, MRSA  Surgical Site Infection Prevention, Anesthesia Post-op Instructions and Care and Recovery After Surgery

## 2011-11-23 NOTE — H&P (Signed)
  NTS SOAP Note  Vital Signs:  Vitals as of: 11/23/2011: Systolic 130: Diastolic 84: Heart Rate 76: Temp 97.51F: Height 17ft 6in: Weight 195Lbs 0 Ounces: Pain Level 6: BMI 31  BMI : 31.47 kg/m2  Subjective: This 46 Years 10 Months old Female presents for of    ABDOMINAL ISSUES: ,Has been having right upper quadrant abdominal pain with radiation to the right flank, nausea, and indigestion for several months now.  Made worse with fatty foods initially, but now is occurring spontaneously.  U/S of gallbladder negative, HIDA scan shows low gallbladder ejection fraction with reproducible symptoms with CCK injection.  Denies fever, chills, jaundice.  Review of Symptoms:  Constitutional:unremarkable   Head:unremarkable    Eyes:unremarkable   Nose/Mouth/Throat:unremarkable Cardiovascular:  unremarkable   Respiratory:unremarkable   Gastrointestin    abdominal pain,nausea,vomiting Genitourinary:unremarkable     Musculoskeletal:unremarkable   Skin:unremarkable Hematolgic/Lymphatic:unremarkable     Allergic/Immunologic:unremarkable     Past Medical History:    Reviewed   Past Medical History  Surgical History: appy, BTL, c-section Medical Problems:  High cholesterol Allergies: nkda Medications: citalopram, simvastatin, omeprazole, loratdine, fish oil   Social History:Reviewed  Social History  Preferred Language: English ([place]) Race:  White Ethnicity: Not Hispanic / Latino Age: 46 Years 3 Months Marital Status:  M Alcohol: socially Recreational drug(s):  No   Smoking Status: Never smoker reviewed on 11/23/2011  Family History:  Reviewed   Family History              Father:  Company secretary, DM, HTN             Mother:  Cancer-cervical    Objective Information: General:  Well appearing, well nourished in no distress.   No scleral icterus Heart:  RRR, no murmur Lungs:    CTA bilaterally, no wheezes, rhonchi,  rales.  Breathing unlabored. Abdomen:Soft, tender in right upper quadrant to deep palpation, ND, normal bowel sounds, no HSM, no masses.  No peritoneal signs.  Assessment:Chronic cholecystitis  Diagnosis &amp; Procedure: DiagnosisCode: 575.11, ProcedureCode: 14782,    Plan:Scheduled for laparoscopic cholecystectomy on 11/27/11.   Patient Education:Alternative treatments to surgery were discussed with patient (and family).  Risks and benefits  of procedure were fully explained to the patient (and family) who gave informed consent. Patient/family questions were addressed.  Follow-up:Pending Surgery

## 2011-11-24 ENCOUNTER — Encounter (HOSPITAL_COMMUNITY): Payer: Self-pay | Admitting: Pharmacy Technician

## 2011-11-27 ENCOUNTER — Encounter (HOSPITAL_COMMUNITY): Payer: Self-pay | Admitting: *Deleted

## 2011-11-27 ENCOUNTER — Ambulatory Visit (HOSPITAL_COMMUNITY)
Admission: RE | Admit: 2011-11-27 | Discharge: 2011-11-27 | Disposition: A | Discharge status: Home / Self Care | Payer: Managed Care, Other (non HMO) | Source: Ambulatory Visit | Attending: General Surgery | Admitting: General Surgery

## 2011-11-27 ENCOUNTER — Encounter (HOSPITAL_COMMUNITY): Payer: Self-pay | Admitting: Anesthesiology

## 2011-11-27 ENCOUNTER — Encounter (HOSPITAL_COMMUNITY)
Admission: RE | Disposition: A | Discharge status: Home / Self Care | Payer: Self-pay | Source: Ambulatory Visit | Attending: General Surgery

## 2011-11-27 ENCOUNTER — Ambulatory Visit (HOSPITAL_COMMUNITY): Payer: Managed Care, Other (non HMO) | Admitting: Anesthesiology

## 2011-11-27 DIAGNOSIS — K811 Chronic cholecystitis: Secondary | ICD-10-CM | POA: Insufficient documentation

## 2011-11-27 DIAGNOSIS — E7800 Pure hypercholesterolemia, unspecified: Secondary | ICD-10-CM | POA: Insufficient documentation

## 2011-11-27 DIAGNOSIS — Z01812 Encounter for preprocedural laboratory examination: Secondary | ICD-10-CM | POA: Insufficient documentation

## 2011-11-27 HISTORY — PX: CHOLECYSTECTOMY: SHX55

## 2011-11-27 SURGERY — LAPAROSCOPIC CHOLECYSTECTOMY
Anesthesia: General | Site: Abdomen | Wound class: Contaminated

## 2011-11-27 MED ORDER — ROCURONIUM BROMIDE 100 MG/10ML IV SOLN
INTRAVENOUS | Status: DC | PRN
  Administered 2011-11-27: 30 mg via INTRAVENOUS

## 2011-11-27 MED ORDER — DEXAMETHASONE SODIUM PHOSPHATE 4 MG/ML IJ SOLN
INTRAMUSCULAR | Status: AC
  Administered 2011-11-27: 4 mg via INTRAVENOUS
  Filled 2011-11-27: qty 1

## 2011-11-27 MED ORDER — BUPIVACAINE HCL 0.5 % IJ SOLN
INTRAMUSCULAR | Status: DC | PRN
  Administered 2011-11-27: 9 mL

## 2011-11-27 MED ORDER — LIDOCAINE HCL (CARDIAC) 10 MG/ML IV SOLN
INTRAVENOUS | Status: DC | PRN
  Administered 2011-11-27: 50 mg via INTRAVENOUS

## 2011-11-27 MED ORDER — GLYCOPYRROLATE 0.2 MG/ML IJ SOLN
INTRAMUSCULAR | Status: AC
  Filled 2011-11-27: qty 3

## 2011-11-27 MED ORDER — GLYCOPYRROLATE 0.2 MG/ML IJ SOLN
INTRAMUSCULAR | Status: AC
  Administered 2011-11-27: 0.2 mg via INTRAVENOUS
  Filled 2011-11-27: qty 1

## 2011-11-27 MED ORDER — NEOSTIGMINE METHYLSULFATE 1 MG/ML IJ SOLN
INTRAMUSCULAR | Status: DC | PRN
  Administered 2011-11-27: 4 mg via INTRAVENOUS

## 2011-11-27 MED ORDER — CEFAZOLIN SODIUM 1-5 GM-% IV SOLN
INTRAVENOUS | Status: DC | PRN
  Administered 2011-11-27: 2 g via INTRAVENOUS

## 2011-11-27 MED ORDER — ONDANSETRON HCL 4 MG/2ML IJ SOLN: 4.0000 mg | Freq: Once | INTRAMUSCULAR | Status: DC | PRN

## 2011-11-27 MED ORDER — CEFAZOLIN SODIUM-DEXTROSE 2-3 GM-% IV SOLR: 2.0000 g | INTRAVENOUS | Status: DC

## 2011-11-27 MED ORDER — BUPIVACAINE HCL (PF) 0.5 % IJ SOLN
INTRAMUSCULAR | Status: AC
  Filled 2011-11-27: qty 30

## 2011-11-27 MED ORDER — ROCURONIUM BROMIDE 50 MG/5ML IV SOLN
INTRAVENOUS | Status: AC
  Filled 2011-11-27: qty 1

## 2011-11-27 MED ORDER — LIDOCAINE HCL (PF) 1 % IJ SOLN
INTRAMUSCULAR | Status: AC
  Filled 2011-11-27: qty 5

## 2011-11-27 MED ORDER — FENTANYL CITRATE 0.05 MG/ML IJ SOLN
INTRAMUSCULAR | Status: AC
  Administered 2011-11-27: 50 ug via INTRAVENOUS
  Filled 2011-11-27: qty 2

## 2011-11-27 MED ORDER — ENOXAPARIN SODIUM 40 MG/0.4ML ~~LOC~~ SOLN
40.0000 mg | Freq: Once | SUBCUTANEOUS | Status: AC
  Administered 2011-11-27: 40 mg via SUBCUTANEOUS

## 2011-11-27 MED ORDER — SODIUM CHLORIDE 0.9 % IR SOLN
Status: DC | PRN
  Administered 2011-11-27: 1000 mL

## 2011-11-27 MED ORDER — MIDAZOLAM HCL 2 MG/2ML IJ SOLN
INTRAMUSCULAR | Status: AC
  Administered 2011-11-27: 2 mg via INTRAVENOUS
  Filled 2011-11-27: qty 2

## 2011-11-27 MED ORDER — NEOSTIGMINE METHYLSULFATE 1 MG/ML IJ SOLN
INTRAMUSCULAR | Status: AC
  Filled 2011-11-27: qty 10

## 2011-11-27 MED ORDER — HEMOSTATIC AGENTS (NO CHARGE) OPTIME
TOPICAL | Status: DC | PRN
  Administered 2011-11-27: 1 via TOPICAL

## 2011-11-27 MED ORDER — ENOXAPARIN SODIUM 40 MG/0.4ML ~~LOC~~ SOLN
SUBCUTANEOUS | Status: AC
  Administered 2011-11-27: 40 mg via SUBCUTANEOUS
  Filled 2011-11-27: qty 0.4

## 2011-11-27 MED ORDER — PROPOFOL 10 MG/ML IV EMUL
INTRAVENOUS | Status: AC
  Filled 2011-11-27: qty 20

## 2011-11-27 MED ORDER — PROPOFOL 10 MG/ML IV EMUL
INTRAVENOUS | Status: DC | PRN
  Administered 2011-11-27: 180 mg via INTRAVENOUS

## 2011-11-27 MED ORDER — MIDAZOLAM HCL 2 MG/2ML IJ SOLN
1.0000 mg | INTRAMUSCULAR | Status: DC | PRN
  Administered 2011-11-27: 2 mg via INTRAVENOUS

## 2011-11-27 MED ORDER — ONDANSETRON HCL 4 MG/2ML IJ SOLN
4.0000 mg | Freq: Once | INTRAMUSCULAR | Status: AC
  Administered 2011-11-27: 4 mg via INTRAVENOUS

## 2011-11-27 MED ORDER — OXYCODONE-ACETAMINOPHEN 7.5-325 MG PO TABS: 1.0000 | ORAL_TABLET | ORAL | Status: DC | PRN

## 2011-11-27 MED ORDER — CEFAZOLIN SODIUM-DEXTROSE 2-3 GM-% IV SOLR
INTRAVENOUS | Status: AC
  Filled 2011-11-27: qty 50

## 2011-11-27 MED ORDER — GLYCOPYRROLATE 0.2 MG/ML IJ SOLN
INTRAMUSCULAR | Status: DC | PRN
  Administered 2011-11-27: 0.6 mg via INTRAVENOUS

## 2011-11-27 MED ORDER — ONDANSETRON HCL 4 MG/2ML IJ SOLN
INTRAMUSCULAR | Status: AC
  Administered 2011-11-27: 4 mg via INTRAVENOUS
  Filled 2011-11-27: qty 2

## 2011-11-27 MED ORDER — KETOROLAC TROMETHAMINE 30 MG/ML IJ SOLN: 30.0000 mg | Freq: Once | INTRAMUSCULAR | Status: DC

## 2011-11-27 MED ORDER — GLYCOPYRROLATE 0.2 MG/ML IJ SOLN
0.2000 mg | Freq: Once | INTRAMUSCULAR | Status: AC
  Administered 2011-11-27: 0.2 mg via INTRAVENOUS

## 2011-11-27 MED ORDER — FENTANYL CITRATE 0.05 MG/ML IJ SOLN
25.0000 ug | INTRAMUSCULAR | Status: DC | PRN
  Administered 2011-11-27 (×4): 50 ug via INTRAVENOUS

## 2011-11-27 MED ORDER — DEXAMETHASONE SODIUM PHOSPHATE 4 MG/ML IJ SOLN
4.0000 mg | Freq: Once | INTRAMUSCULAR | Status: AC
  Administered 2011-11-27: 4 mg via INTRAVENOUS

## 2011-11-27 MED ORDER — FENTANYL CITRATE 0.05 MG/ML IJ SOLN
INTRAMUSCULAR | Status: DC | PRN
  Administered 2011-11-27 (×2): 100 ug via INTRAVENOUS

## 2011-11-27 MED ORDER — LACTATED RINGERS IV SOLN
INTRAVENOUS | Status: DC
  Administered 2011-11-27: 1000 mL via INTRAVENOUS

## 2011-11-27 MED ORDER — LACTATED RINGERS IV SOLN
INTRAVENOUS | Status: DC | PRN
  Administered 2011-11-27: 09:00:00 via INTRAVENOUS

## 2011-11-27 SURGICAL SUPPLY — 34 items
APPLIER CLIP ROT 10 11.4 M/L (STAPLE) ×2
APR CLP MED LRG 11.4X10 (STAPLE) ×1
BAG HAMPER (MISCELLANEOUS) ×2 IMPLANT
BAG SPEC RTRVL LRG 6X4 10 (ENDOMECHANICALS) ×1
CLIP APPLIE ROT 10 11.4 M/L (STAPLE) ×1 IMPLANT
CLOTH BEACON ORANGE TIMEOUT ST (SAFETY) ×2 IMPLANT
COVER LIGHT HANDLE STERIS (MISCELLANEOUS) ×4 IMPLANT
DECANTER SPIKE VIAL GLASS SM (MISCELLANEOUS) ×2 IMPLANT
DURAPREP 26ML APPLICATOR (WOUND CARE) ×2 IMPLANT
ELECT REM PT RETURN 9FT ADLT (ELECTROSURGICAL) ×2
ELECTRODE REM PT RTRN 9FT ADLT (ELECTROSURGICAL) ×1 IMPLANT
FILTER SMOKE EVAC LAPAROSHD (FILTER) ×2 IMPLANT
FORMALIN 10 PREFIL 120ML (MISCELLANEOUS) ×2 IMPLANT
GLOVE BIO SURGEON STRL SZ7.5 (GLOVE) ×2 IMPLANT
GLOVE ECLIPSE 6.5 STRL STRAW (GLOVE) ×1 IMPLANT
GLOVE ECLIPSE 7.0 STRL STRAW (GLOVE) ×1 IMPLANT
GLOVE INDICATOR 7.0 STRL GRN (GLOVE) ×1 IMPLANT
GLOVE INDICATOR 7.5 STRL GRN (GLOVE) ×1 IMPLANT
GOWN STRL REIN XL XLG (GOWN DISPOSABLE) ×6 IMPLANT
INST SET LAPROSCOPIC AP (KITS) ×2 IMPLANT
KIT ROOM TURNOVER APOR (KITS) ×2 IMPLANT
KIT TROCAR LAP CHOLE (TROCAR) ×2 IMPLANT
MANIFOLD NEPTUNE II (INSTRUMENTS) ×2 IMPLANT
NS IRRIG 1000ML POUR BTL (IV SOLUTION) ×2 IMPLANT
PACK LAP CHOLE LZT030E (CUSTOM PROCEDURE TRAY) ×2 IMPLANT
PAD ARMBOARD 7.5X6 YLW CONV (MISCELLANEOUS) ×2 IMPLANT
POUCH SPECIMEN RETRIEVAL 10MM (ENDOMECHANICALS) ×2 IMPLANT
SET BASIN LINEN APH (SET/KITS/TRAYS/PACK) ×2 IMPLANT
SPONGE GAUZE 2X2 8PLY STRL LF (GAUZE/BANDAGES/DRESSINGS) ×8 IMPLANT
STAPLER VISISTAT (STAPLE) ×2 IMPLANT
SUT VICRYL 0 UR6 27IN ABS (SUTURE) ×2 IMPLANT
TAPE CLOTH SURG 4X10 WHT LF (GAUZE/BANDAGES/DRESSINGS) ×1 IMPLANT
WARMER LAPAROSCOPE (MISCELLANEOUS) ×2 IMPLANT
YANKAUER SUCT 12FT TUBE ARGYLE (SUCTIONS) ×2 IMPLANT

## 2011-11-27 NOTE — Anesthesia Postprocedure Evaluation (Signed)
  Anesthesia Post-op Note  Patient: Heidi Payne  Procedure(s) Performed: Procedure(s) (LRB): LAPAROSCOPIC CHOLECYSTECTOMY (N/A)  Patient Location: PACU  Anesthesia Type: General  Level of Consciousness: awake, alert , oriented and patient cooperative  Airway and Oxygen Therapy: Patient Spontanous Breathing and Patient connected to face mask oxygen  Post-op Pain: mild  Post-op Assessment: Post-op Vital signs reviewed, Patient's Cardiovascular Status Stable, Respiratory Function Stable, Patent Airway and No signs of Nausea or vomiting  Post-op Vital Signs: Reviewed and stable  Complications: No apparent anesthesia complications

## 2011-11-27 NOTE — Preoperative (Signed)
Beta Blockers   Reason not to administer Beta Blockers:Not Applicable 

## 2011-11-27 NOTE — Interval H&P Note (Signed)
History and Physical Interval Note:  11/27/2011 9:56 AM  Heidi Payne  has presented today for surgery, with the diagnosis of Cholelithiasis  The various methods of treatment have been discussed with the patient and family. After consideration of risks, benefits and other options for treatment, the patient has consented to  Procedure(s) (LRB): LAPAROSCOPIC CHOLECYSTECTOMY (N/A) as a surgical intervention .  The patient's history has been reviewed, patient examined, no change in status, stable for surgery.  I have reviewed the patients' chart and labs.  Questions were answered to the patient's satisfaction.     Franky Macho A

## 2011-11-27 NOTE — Anesthesia Preprocedure Evaluation (Signed)
Anesthesia Evaluation  Patient identified by MRN, date of birth, ID band Patient awake    Reviewed: Allergy & Precautions, H&P , NPO status , Patient's Chart, lab work & pertinent test results  History of Anesthesia Complications Negative for: history of anesthetic complications  Airway       Dental  (+) Teeth Intact   Pulmonary neg pulmonary ROS,  breath sounds clear to auscultation        Cardiovascular negative cardio ROS  Rhythm:Regular     Neuro/Psych    GI/Hepatic GERD-  Medicated and Controlled,  Endo/Other    Renal/GU      Musculoskeletal   Abdominal   Peds  Hematology   Anesthesia Other Findings   Reproductive/Obstetrics                           Anesthesia Physical Anesthesia Plan  ASA: II  Anesthesia Plan: General   Post-op Pain Management:    Induction: Intravenous, Rapid sequence and Cricoid pressure planned  Airway Management Planned: Oral ETT  Additional Equipment:   Intra-op Plan:   Post-operative Plan: Extubation in OR  Informed Consent: I have reviewed the patients History and Physical, chart, labs and discussed the procedure including the risks, benefits and alternatives for the proposed anesthesia with the patient or authorized representative who has indicated his/her understanding and acceptance.     Plan Discussed with:   Anesthesia Plan Comments:         Anesthesia Quick Evaluation

## 2011-11-27 NOTE — Op Note (Signed)
Patient:  Heidi Payne  DOB:  Nov 12, 1965  MRN:  [id number]   Preop Diagnosis:  Chronic cholecystitis  Postop Diagnosis:  Same  Procedure:  Laparoscopic cholecystectomy  Surgeon:  Franky Macho, M.D.  Anes:  General endotracheal  Indications:  Patient is a 46 year old white female presents with biliary colic secondary to chronic cholecystitis. The risks and benefits of the procedure including bleeding, infection, hepatobiliary injury, the possibly of an open procedure were fully explained to the patient, gave informed consent.  Procedure note:  Patient's placed the supine position. After induction of general endotracheal anesthesia, the abdomen was prepped and draped using usual sterile technique with DuraPrep. Surgical site confirmation was performed.  A supraumbilical incision was made down to the fascia. A Veress needle was introduced into the abdominal cavity and confirmation of placement was done using the saline drop test. The abdomen was then insufflated to 16 mm mercury pressure. An 11 mm trocar was introduced into the abdominal cavity under direct visualization without difficulty. The patient was placed in reverse Trendelenburg position and additional 11 mm trocar was placed the epigastric region and 5 mm trochars were placed were upper quadrant and right flank regions. The liver was inspected and noted to be within normal limits. The gallbladder was retracted superiorly and laterally. Dissection was begun around the infundibulum of the gallbladder. The cystic duct was first identified. Its juncture to the infundibulum was fully identified. Endoclips were placed proximally and distally on the cystic duct and the cystic duct was divided. This is likewise done to the cystic artery. The gallbladder was then freed away from the gallbladder fossa using Bovie electrocautery. The gallbladder was delivered through the epigastric trocar site using an Endo Catch bag. The gallbladder fossa was  inspected and no abnormal bleeding or bile leakage was noted. Surgicel was placed in the gallbladder fossa. All fluid and air were then evacuated from the abdominal cavity prior to removal of the trochars.  All wounds were irrigated with normal saline. All wounds were injected with 0.5% Sensorcaine. The supraumbilical fascia was reapproximated using an 0 Vicryl interrupted suture. All skin incisions were closed using staples. Betadine ointment and dry sterile dressings were applied.  All tape and needle counts were correct at the end of the procedure. Patient was extubated in the operating room and went back to recovery room awake in stable condition.  Complications:  None  EBL:  Minimal  Specimen:  Gallbladder

## 2011-11-27 NOTE — Transfer of Care (Signed)
Immediate Anesthesia Transfer of Care Note  Patient: Heidi Payne  Procedure(s) Performed: Procedure(s) (LRB): LAPAROSCOPIC CHOLECYSTECTOMY (N/A)  Patient Location: PACU  Anesthesia Type: General  Level of Consciousness: awake, alert , oriented and patient cooperative  Airway & Oxygen Therapy: Patient Spontanous Breathing and Patient connected to face mask oxygen  Post-op Assessment: Report given to PACU RN and Post -op Vital signs reviewed and stable  Post vital signs: Reviewed and stable  Complications: No apparent anesthesia complications

## 2011-11-30 ENCOUNTER — Encounter (HOSPITAL_COMMUNITY): Payer: Self-pay | Admitting: General Surgery

## 2012-02-01 ENCOUNTER — Other Ambulatory Visit: Payer: Self-pay | Admitting: Adult Health

## 2012-02-01 DIAGNOSIS — Z139 Encounter for screening, unspecified: Secondary | ICD-10-CM

## 2012-03-08 ENCOUNTER — Other Ambulatory Visit (HOSPITAL_COMMUNITY)
Admission: RE | Admit: 2012-03-08 | Discharge: 2012-03-08 | Disposition: A | Discharge status: Home / Self Care | Payer: Managed Care, Other (non HMO) | Source: Ambulatory Visit | Attending: Obstetrics and Gynecology | Admitting: Obstetrics and Gynecology

## 2012-03-08 ENCOUNTER — Other Ambulatory Visit: Payer: Self-pay | Admitting: Adult Health

## 2012-03-08 ENCOUNTER — Ambulatory Visit (HOSPITAL_COMMUNITY)
Admission: RE | Admit: 2012-03-08 | Discharge: 2012-03-08 | Disposition: A | Discharge status: Home / Self Care | Payer: Managed Care, Other (non HMO) | Source: Ambulatory Visit | Attending: Adult Health | Admitting: Adult Health

## 2012-03-08 DIAGNOSIS — Z1231 Encounter for screening mammogram for malignant neoplasm of breast: Secondary | ICD-10-CM | POA: Insufficient documentation

## 2012-03-08 DIAGNOSIS — Z1151 Encounter for screening for human papillomavirus (HPV): Secondary | ICD-10-CM | POA: Insufficient documentation

## 2012-03-08 DIAGNOSIS — Z01419 Encounter for gynecological examination (general) (routine) without abnormal findings: Secondary | ICD-10-CM | POA: Insufficient documentation

## 2012-03-08 DIAGNOSIS — Z139 Encounter for screening, unspecified: Secondary | ICD-10-CM

## 2012-03-14 ENCOUNTER — Encounter (HOSPITAL_COMMUNITY): Payer: Self-pay | Admitting: Pharmacy Technician

## 2012-03-14 NOTE — H&P (Signed)
  NTS SOAP Note  Vital Signs:  Vitals as of: 03/14/2012: Systolic 143: Diastolic 94: Heart Rate 73: Temp 98.73F: Height 67ft 6in: Weight 200Lbs 0 Ounces: BMI 32  BMI : 32.28 kg/m2  Subjective: This 46 Years 46 Months old Female presents for screening TCS.  Father diagnosed in past with colon cancer.  Denies any GI complaints.  Never has had a colonoscopy.   Review of Symptoms:  Constitutional:unremarkable   Head:unremarkable    Eyes:unremarkable   Nose/Mouth/Throat:unremarkable Cardiovascular:  unremarkable   Respiratory:unremarkable   Gastrointestinal:  unremarkable   Genitourinary:unremarkable     Musculoskeletal:unremarkable   Hematolgic/Lymphatic:unremarkable     Allergic/Immunologic:unremarkable     Past Medical History:    Reviewed   Past Medical History  Surgical History: appy, BTL, c-section Medical Problems:  High cholesterol Allergies: nkda Medications: citalopram, simvastatin, omeprazole, loratdine, fish oil   Social History:Reviewed  Social History  Preferred Language: English ([place]) Race:  White Ethnicity: Not Hispanic / Latino Age: 46 Years 3 Months Marital Status:  M Alcohol: socially Recreational drug(s):  No   Smoking Status: Never smoker reviewed on 11/23/2011    Family History:  Reviewed   Family History              Father:  Company secretary, DM, HTN             Mother:  Cancer-cervical    Objective Information: General:  Well appearing, well nourished in no distress. Neck:  Supple without lymphadenopathy.  Heart:  RRR, no murmur Lungs:    CTA bilaterally, no wheezes, rhonchi, rales.  Breathing unlabored. Abdomen:Soft, NT/ND, no HSM, no masses.   deferred to procedure  Assessment:Family h/o colon cancer  Diagnosis &amp; Procedure: DiagnosisCode: V16.0, ProcedureCode: 11914,    Plan:Scheduled for TCS on 03/19/12.   Patient Education:Alternative treatments  to surgery were discussed with patient (and family).  Risks and benefits  of procedure were fully explained to the patient (and family) who gave informed consent. Patient/family questions were addressed.  Follow-up:Pending Surgery

## 2012-03-19 ENCOUNTER — Encounter (HOSPITAL_COMMUNITY): Payer: Self-pay | Admitting: *Deleted

## 2012-03-19 ENCOUNTER — Encounter (HOSPITAL_COMMUNITY)
Admission: RE | Disposition: A | Discharge status: Home / Self Care | Payer: Self-pay | Source: Ambulatory Visit | Attending: General Surgery

## 2012-03-19 ENCOUNTER — Ambulatory Visit (HOSPITAL_COMMUNITY)
Admission: RE | Admit: 2012-03-19 | Discharge: 2012-03-19 | Disposition: A | Discharge status: Home / Self Care | Payer: Managed Care, Other (non HMO) | Source: Ambulatory Visit | Attending: General Surgery | Admitting: General Surgery

## 2012-03-19 DIAGNOSIS — Z1211 Encounter for screening for malignant neoplasm of colon: Secondary | ICD-10-CM | POA: Insufficient documentation

## 2012-03-19 DIAGNOSIS — Z800 Family history of malignant neoplasm of digestive organs: Secondary | ICD-10-CM | POA: Insufficient documentation

## 2012-03-19 HISTORY — PX: COLONOSCOPY: SHX5424

## 2012-03-19 SURGERY — COLONOSCOPY
Anesthesia: Moderate Sedation

## 2012-03-19 MED ORDER — MIDAZOLAM HCL 5 MG/5ML IJ SOLN
INTRAMUSCULAR | Status: AC
  Filled 2012-03-19: qty 5

## 2012-03-19 MED ORDER — MIDAZOLAM HCL 5 MG/5ML IJ SOLN
INTRAMUSCULAR | Status: DC | PRN
  Administered 2012-03-19: 1 mg via INTRAVENOUS
  Administered 2012-03-19: 4 mg via INTRAVENOUS

## 2012-03-19 MED ORDER — SODIUM CHLORIDE 0.45 % IV SOLN
INTRAVENOUS | Status: DC
  Administered 2012-03-19: 08:00:00 via INTRAVENOUS

## 2012-03-19 MED ORDER — STERILE WATER FOR IRRIGATION IR SOLN
Status: DC | PRN
  Administered 2012-03-19: 09:00:00

## 2012-03-19 MED ORDER — MEPERIDINE HCL 50 MG/ML IJ SOLN
INTRAMUSCULAR | Status: AC
  Filled 2012-03-19: qty 1

## 2012-03-19 MED ORDER — MEPERIDINE HCL 25 MG/ML IJ SOLN
INTRAMUSCULAR | Status: DC | PRN
  Administered 2012-03-19: 50 mg via INTRAVENOUS

## 2012-03-19 NOTE — Interval H&P Note (Signed)
History and Physical Interval Note:  03/19/2012 8:59 AM  Heidi Payne  has presented today for surgery, with the diagnosis of Family hx of colorectal cancer   The various methods of treatment have been discussed with the patient and family. After consideration of risks, benefits and other options for treatment, the patient has consented to  Procedure(s) (LRB) with comments: COLONOSCOPY (N/A) as a surgical intervention .  The patient's history has been reviewed, patient examined, no change in status, stable for surgery.  I have reviewed the patient's chart and labs.  Questions were answered to the patient's satisfaction.     Franky Macho A

## 2012-03-19 NOTE — Op Note (Signed)
[facility] [address] [place], [address]   COLONOSCOPY PROCEDURE REPORT  PATIENT: Heidi, Payne  MR#: [id number] BIRTHDATE: 08/12/1965 , 46  yrs. old GENDER: Female ENDOSCOPIST: Franky Macho, MD REFERRED XB:JYNWGN, Terry PROCEDURE DATE:  03/19/2012 PROCEDURE:   Colonoscopy, screening ASA CLASS:   Class I INDICATIONS:patient's immediate family history of colon cancer. MEDICATIONS: Versed 5 mg IV and Demerol 50 mg IV  DESCRIPTION OF PROCEDURE:   After the risks benefits and alternatives of the procedure were thoroughly explained, informed consent was obtained.  A digital rectal exam revealed no abnormalities of the rectum.   The Pentax Colonoscope [id number] endoscope was introduced through the anus and advanced to the cecum, which was identified by both the appendix and ileocecal valve. No adverse events experienced.   The quality of the prep was adequate, using MoviPrep  The instrument was then slowly withdrawn as the colon was fully examined.      COLON FINDINGS: A normal appearing cecum, ileocecal valve, and appendiceal orifice were identified.  The ascending, hepatic flexure, transverse, splenic flexure, descending, sigmoid colon and rectum appeared unremarkable.  No polyps or cancers were seen. Retroflexed views revealed no abnormalities. The time to cecum=4 minutes 0 seconds.  Withdrawal time=4 minutes 0 seconds.  The scope was withdrawn and the procedure completed. COMPLICATIONS: There were no complications.  ENDOSCOPIC IMPRESSION: Normal colon  RECOMMENDATIONS: Repeat Colonoscopy in 5 years.   eSigned:  Franky Macho, MD 03/19/2012 9:20 AM   cc:

## 2012-03-22 ENCOUNTER — Encounter (HOSPITAL_COMMUNITY): Payer: Self-pay | Admitting: General Surgery

## 2012-06-29 ENCOUNTER — Other Ambulatory Visit: Payer: Self-pay

## 2013-01-30 ENCOUNTER — Other Ambulatory Visit: Payer: Self-pay | Admitting: Adult Health

## 2013-01-30 DIAGNOSIS — Z139 Encounter for screening, unspecified: Secondary | ICD-10-CM

## 2013-03-20 ENCOUNTER — Other Ambulatory Visit: Payer: Self-pay

## 2013-03-24 ENCOUNTER — Other Ambulatory Visit: Payer: Self-pay | Admitting: Adult Health

## 2013-03-24 ENCOUNTER — Ambulatory Visit (HOSPITAL_COMMUNITY)
Admission: RE | Admit: 2013-03-24 | Discharge: 2013-03-24 | Disposition: A | Discharge status: Home / Self Care | Payer: Managed Care, Other (non HMO) | Source: Ambulatory Visit | Attending: Adult Health | Admitting: Adult Health

## 2013-03-24 DIAGNOSIS — Z1231 Encounter for screening mammogram for malignant neoplasm of breast: Secondary | ICD-10-CM | POA: Insufficient documentation

## 2013-03-24 DIAGNOSIS — Z139 Encounter for screening, unspecified: Secondary | ICD-10-CM

## 2013-05-05 ENCOUNTER — Encounter: Payer: Self-pay | Admitting: Adult Health

## 2013-05-05 ENCOUNTER — Ambulatory Visit (INDEPENDENT_AMBULATORY_CARE_PROVIDER_SITE_OTHER): Payer: Managed Care, Other (non HMO) | Admitting: Adult Health

## 2013-05-05 VITALS — BP 120/82 | HR 74 | Ht 65.5 in | Wt 187.0 lb

## 2013-05-05 DIAGNOSIS — Z01419 Encounter for gynecological examination (general) (routine) without abnormal findings: Secondary | ICD-10-CM

## 2013-05-05 DIAGNOSIS — Z1212 Encounter for screening for malignant neoplasm of rectum: Secondary | ICD-10-CM

## 2013-05-05 DIAGNOSIS — Z139 Encounter for screening, unspecified: Secondary | ICD-10-CM

## 2013-05-05 LAB — HEMOCCULT GUIAC POC 1CARD (OFFICE): Fecal Occult Blood, POC: NEGATIVE

## 2013-05-05 NOTE — Patient Instructions (Signed)
Physical in 1 year Mammogram yearly  Labs in near future Colonoscopy 2018

## 2013-05-05 NOTE — Progress Notes (Signed)
Patient ID: FENDI MEINHARDT, female   DOB: 14-Nov-1965, 47 y.o.   MRN: [id number] History of Present Illness: Alexia Freestone is a 47 year old white female,separated, in for a physical.She had a normal pap with negative HPV 03/08/12.She is in a relationship.   Current Medications, Allergies, Past Medical History, Past Surgical History, Family History and Social History were reviewed in [employer] record.   Past Medical History  Diagnosis Date  . GERD (gastroesophageal reflux disease)   . Hyperlipidemia   . Mental disorder     depression.  . Vitiligo    Past Surgical History  Procedure Laterality Date  . Appendectomy      1975  . Cesarean section w/btl  2005  . Dilation and curettage of uterus    . Cholecystectomy  11/27/2011    Procedure: LAPAROSCOPIC CHOLECYSTECTOMY;  Surgeon: Dalia Heading, MD;  Location: [facility] ORS;  Service: General;  Laterality: N/A;  . Colonoscopy  03/19/2012    Procedure: COLONOSCOPY;  Surgeon: Dalia Heading, MD;  Location: [facility] END[facility] SUITE;  Service: Gastroenterology;  Laterality: N/A;  Current outpatient prescriptions:ibuprofen (ADVIL,MOTRIN) 800 MG tablet, Take 800 mg by mouth every 8 (eight) hours as needed. For pain, Disp: , Rfl: ;  loratadine (CLARITIN) 10 MG tablet, Take 10 mg by mouth daily as needed. For allergies, Disp: , Rfl: ;  omeprazole (PRILOSEC) 20 MG capsule, Take 20 mg by mouth every morning. , Disp: , Rfl: ;  simvastatin (ZOCOR) 20 MG tablet, Take 20 mg by mouth every evening., Disp: , Rfl:  Cholecalciferol (VITAMIN D3) 5000 UNITS TABS, Take 1 tablet by mouth every morning., Disp: , Rfl: ;  citalopram (CELEXA) 40 MG tablet, Take 40 mg by mouth every morning. , Disp: , Rfl:   Review of Systems: Patient denies any headaches, blurred vision, shortness of breath, chest pain, abdominal pain, problems with bowel movements, urination, or intercourse. No joint pain or mood swings,she has stopped taking celexa and says she feels better than she has  in long time.    Physical Exam:BP 120/82  Pulse 74  Ht 5' 5.5" (1.664 m)  Wt 187 lb (84.823 kg)  BMI 30.63 kg/m2  LMP 04/24/2013 General:  Well developed, well nourished, no acute distress Skin:  Warm and dry,has vitiligo  Neck:  Midline trachea, normal thyroid Lungs; Clear to auscultation bilaterally Breast:  No dominant palpable mass, retraction, or nipple discharge Cardiovascular: Regular rate and rhythm Abdomen:  Soft, non tender, no hepatosplenomegaly Pelvic:  External genitalia is normal in appearance.  The vagina is normal in appearance.The cervix is bulbous.GC/CHL performed.  Uterus is felt to be normal size, shape, and contour.  No                adnexal masses or tenderness noted. Rectal: Good sphincter tone, no polyps, or hemorrhoids felt.  Hemoccult negative. Extremities:  No swelling or varicosities noted Psych:  No mood changes,alert and  cooperative,seems happy   Impression: Yearly gyn exam no pap  Plan: Physical in 1 year Mammogram yearly  Labs in near future fasting Colonoscopy 2018

## 2013-05-06 ENCOUNTER — Telehealth: Payer: Self-pay | Admitting: Adult Health

## 2013-05-06 LAB — GC/CHLAMYDIA PROBE AMP: GC Probe RNA: NEGATIVE

## 2013-05-06 NOTE — Telephone Encounter (Signed)
Pt aware of labs  

## 2013-05-17 ENCOUNTER — Other Ambulatory Visit: Payer: Self-pay | Admitting: Obstetrics and Gynecology

## 2013-06-13 ENCOUNTER — Other Ambulatory Visit: Payer: Managed Care, Other (non HMO)

## 2013-06-13 DIAGNOSIS — Z0000 Encounter for general adult medical examination without abnormal findings: Secondary | ICD-10-CM

## 2013-06-13 DIAGNOSIS — Z13220 Encounter for screening for lipoid disorders: Secondary | ICD-10-CM

## 2013-06-13 DIAGNOSIS — Z1329 Encounter for screening for other suspected endocrine disorder: Secondary | ICD-10-CM

## 2013-06-13 LAB — COMPREHENSIVE METABOLIC PANEL
ALK PHOS: 50 U/L (ref 39–117)
ALT: 22 U/L (ref 0–35)
AST: 17 U/L (ref 0–37)
Albumin: 4.4 g/dL (ref 3.5–5.2)
BILIRUBIN TOTAL: 0.4 mg/dL (ref 0.2–1.2)
BUN: 13 mg/dL (ref 6–23)
CALCIUM: 9.2 mg/dL (ref 8.4–10.5)
CO2: 28 mEq/L (ref 19–32)
CREATININE: 0.74 mg/dL (ref 0.50–1.10)
Chloride: 104 mEq/L (ref 96–112)
Glucose, Bld: 91 mg/dL (ref 70–99)
Potassium: 4.4 mEq/L (ref 3.5–5.3)
Sodium: 139 mEq/L (ref 135–145)
Total Protein: 6.6 g/dL (ref 6.0–8.3)

## 2013-06-13 LAB — LIPID PANEL
CHOL/HDL RATIO: 3.7 ratio
Cholesterol: 187 mg/dL (ref 0–200)
HDL: 50 mg/dL (ref >39)
LDL CALC: 111 mg/dL — AB (ref 0–99)
Triglycerides: 128 mg/dL (ref <150)
VLDL: 26 mg/dL (ref 0–40)

## 2013-06-13 LAB — CBC
HCT: 41.7 % (ref 36.0–46.0)
Hemoglobin: 14.4 g/dL (ref 12.0–15.0)
MCH: 28.6 pg (ref 26.0–34.0)
MCHC: 34.5 g/dL (ref 30.0–36.0)
MCV: 82.7 fL (ref 78.0–100.0)
PLATELETS: 289 10*3/uL (ref 150–400)
RBC: 5.04 MIL/uL (ref 3.87–5.11)
RDW: 13.7 % (ref 11.5–15.5)
WBC: 6.3 10*3/uL (ref 4.0–10.5)

## 2013-06-14 LAB — TSH: TSH: 1.604 u[IU]/mL (ref 0.350–4.500)

## 2013-06-16 ENCOUNTER — Telehealth: Payer: Self-pay | Admitting: Adult Health

## 2013-06-16 NOTE — Telephone Encounter (Signed)
Pt aware labs good 

## 2014-02-27 ENCOUNTER — Other Ambulatory Visit: Payer: Self-pay

## 2014-03-03 ENCOUNTER — Other Ambulatory Visit: Payer: Self-pay | Admitting: Adult Health

## 2014-03-03 DIAGNOSIS — Z1231 Encounter for screening mammogram for malignant neoplasm of breast: Secondary | ICD-10-CM

## 2014-03-16 ENCOUNTER — Encounter: Payer: Self-pay | Admitting: Adult Health

## 2014-04-13 ENCOUNTER — Ambulatory Visit (HOSPITAL_COMMUNITY): Payer: Managed Care, Other (non HMO)

## 2014-05-27 ENCOUNTER — Ambulatory Visit (HOSPITAL_COMMUNITY)
Admission: RE | Admit: 2014-05-27 | Discharge: 2014-05-27 | Disposition: A | Discharge status: Home / Self Care | Payer: Managed Care, Other (non HMO) | Source: Ambulatory Visit | Attending: Adult Health | Admitting: Adult Health

## 2014-05-27 DIAGNOSIS — Z1231 Encounter for screening mammogram for malignant neoplasm of breast: Secondary | ICD-10-CM | POA: Diagnosis present

## 2014-12-31 ENCOUNTER — Ambulatory Visit (INDEPENDENT_AMBULATORY_CARE_PROVIDER_SITE_OTHER): Payer: Managed Care, Other (non HMO) | Admitting: Adult Health

## 2014-12-31 ENCOUNTER — Encounter: Payer: Self-pay | Admitting: Adult Health

## 2014-12-31 ENCOUNTER — Other Ambulatory Visit (HOSPITAL_COMMUNITY)
Admission: RE | Admit: 2014-12-31 | Discharge: 2014-12-31 | Disposition: A | Discharge status: Home / Self Care | Payer: Managed Care, Other (non HMO) | Source: Ambulatory Visit | Attending: Adult Health | Admitting: Adult Health

## 2014-12-31 VITALS — BP 120/80 | HR 88 | Ht 66.0 in | Wt 197.0 lb

## 2014-12-31 DIAGNOSIS — Z1212 Encounter for screening for malignant neoplasm of rectum: Secondary | ICD-10-CM

## 2014-12-31 DIAGNOSIS — Z01411 Encounter for gynecological examination (general) (routine) with abnormal findings: Secondary | ICD-10-CM | POA: Insufficient documentation

## 2014-12-31 DIAGNOSIS — Z1151 Encounter for screening for human papillomavirus (HPV): Secondary | ICD-10-CM | POA: Insufficient documentation

## 2014-12-31 DIAGNOSIS — Z01419 Encounter for gynecological examination (general) (routine) without abnormal findings: Secondary | ICD-10-CM

## 2014-12-31 DIAGNOSIS — E7800 Pure hypercholesterolemia, unspecified: Secondary | ICD-10-CM

## 2014-12-31 LAB — HEMOCCULT GUIAC POC 1CARD (OFFICE): Fecal Occult Blood, POC: NEGATIVE

## 2014-12-31 NOTE — Progress Notes (Signed)
Patient ID: Heidi Payne, female   DOB: 1966-05-05, 49 y.o.   MRN: [id number] History of Present Illness: Heidi Payne) is a 49 year old white female, married in for a well woman gyn exam.Has pain in left shoulder and some shortness of breath, and cough and sees [facility] for both, actually has appt this afternoon.Periods regular and last about 3 days.   Current Medications, Allergies, Past Medical History, Past Surgical History, Family History and Social History were reviewed in [employer] record.     Review of Systems: Patient denies any headaches, hearing loss, fatigue, blurred vision,  chest pain, abdominal pain, problems with bowel movements, urination, or intercourse. No joint pain or mood swings.She HPI for positives.    Physical Exam:BP 138/90 mmHg  Pulse 88  Ht 5\' 6"  (1.676 m)  Wt 197 lb (89.359 kg)  BMI 31.81 kg/m2  LMP 12/21/2014 BP recheck 120/ 80 in right arm. General:  Well developed, well nourished, no acute distress Skin:  Warm and dry,has vitiligo  Neck:  Midline trachea, normal thyroid, good ROM, no lymphadenopathy Lungs; Clear to auscultation bilaterally Breast:  No dominant palpable mass, retraction, or nipple discharge Cardiovascular: Regular rate and rhythm Abdomen:  Soft, non tender, no hepatosplenomegaly Pelvic:  External genitalia is normal in appearance, no lesions.  The vagina is normal in appearance. Urethra has no lesions or masses. The cervix is bulbous.Pap with HPV performed.  Uterus is felt to be normal size, shape, and contour.  No adnexal masses or tenderness noted.Bladder is non tender, no masses felt. Rectal: Good sphincter tone, no polyps, or hemorrhoids felt.  Hemoccult negative. Extremities/musculoskeletal:  No swelling or varicosities noted, no clubbing or cyanosis Psych:  No mood changes, alert and cooperative,seems happy   Impression: Well woman gyn exam with pap History of elevated cholesterol    Plan: Check  CBC,CMP,TSH and lipids Physical in 1 year Mammogram yearly, had normal one in January 2016 Colonoscopy in 2018, had last one 2013 by Dr Arnoldo Morale

## 2014-12-31 NOTE — Patient Instructions (Signed)
Physical in  1 year mammogram yearly Colonoscopy 2018 Will talk when labs back

## 2015-01-01 ENCOUNTER — Telehealth: Payer: Self-pay | Admitting: Adult Health

## 2015-01-01 LAB — CBC
HEMATOCRIT: 41.4 % (ref 34.0–46.6)
HEMOGLOBIN: 14 g/dL (ref 11.1–15.9)
MCH: 28.1 pg (ref 26.6–33.0)
MCHC: 33.8 g/dL (ref 31.5–35.7)
MCV: 83 fL (ref 79–97)
Platelets: 305 10*3/uL (ref 150–379)
RBC: 4.98 x10E6/uL (ref 3.77–5.28)
RDW: 14.3 % (ref 12.3–15.4)
WBC: 5.9 10*3/uL (ref 3.4–10.8)

## 2015-01-01 LAB — CYTOLOGY - PAP

## 2015-01-01 LAB — COMPREHENSIVE METABOLIC PANEL
ALT: 45 IU/L — ABNORMAL HIGH (ref 0–32)
AST: 28 IU/L (ref 0–40)
Albumin/Globulin Ratio: 2 (ref 1.1–2.5)
Albumin: 4.5 g/dL (ref 3.5–5.5)
Alkaline Phosphatase: 64 IU/L (ref 39–117)
BUN/Creatinine Ratio: 9 (ref 9–23)
BUN: 7 mg/dL (ref 6–24)
Bilirubin Total: 0.4 mg/dL (ref 0.0–1.2)
CALCIUM: 9.6 mg/dL (ref 8.7–10.2)
CO2: 24 mmol/L (ref 18–29)
Chloride: 100 mmol/L (ref 97–108)
Creatinine, Ser: 0.76 mg/dL (ref 0.57–1.00)
GFR calc Af Amer: 107 mL/min/{1.73_m2} (ref >59)
GFR, EST NON AFRICAN AMERICAN: 92 mL/min/{1.73_m2} (ref >59)
GLOBULIN, TOTAL: 2.2 g/dL (ref 1.5–4.5)
Glucose: 90 mg/dL (ref 65–99)
Potassium: 3.8 mmol/L (ref 3.5–5.2)
SODIUM: 142 mmol/L (ref 134–144)
Total Protein: 6.7 g/dL (ref 6.0–8.5)

## 2015-01-01 LAB — LIPID PANEL
CHOLESTEROL TOTAL: 236 mg/dL — AB (ref 100–199)
Chol/HDL Ratio: 5.5 ratio units — ABNORMAL HIGH (ref 0.0–4.4)
HDL: 43 mg/dL (ref >39)
LDL Calculated: 160 mg/dL — ABNORMAL HIGH (ref 0–99)
TRIGLYCERIDES: 166 mg/dL — AB (ref 0–149)
VLDL CHOLESTEROL CAL: 33 mg/dL (ref 5–40)

## 2015-01-01 LAB — TSH: TSH: 1.23 u[IU]/mL (ref 0.450–4.500)

## 2015-01-01 MED ORDER — SIMVASTATIN 20 MG PO TABS: 20.0000 mg | ORAL_TABLET | Freq: Every day | ORAL | Status: DC

## 2015-01-01 NOTE — Telephone Encounter (Signed)
Pt aware of labs will rx zocor and she needs to recheck labs in 3 months put in recall, she knows to modify diet and exercise

## 2015-01-04 ENCOUNTER — Telehealth: Payer: Self-pay | Admitting: Adult Health

## 2015-01-04 NOTE — Telephone Encounter (Signed)
Pt aware pap abnormal needs colpo, will make appt

## 2015-01-11 ENCOUNTER — Encounter: Payer: Self-pay | Admitting: Obstetrics & Gynecology

## 2015-01-11 ENCOUNTER — Ambulatory Visit (INDEPENDENT_AMBULATORY_CARE_PROVIDER_SITE_OTHER): Payer: Managed Care, Other (non HMO) | Admitting: Obstetrics & Gynecology

## 2015-01-11 VITALS — BP 100/60 | HR 80 | Wt 197.0 lb

## 2015-01-11 DIAGNOSIS — N870 Mild cervical dysplasia: Secondary | ICD-10-CM

## 2015-01-11 DIAGNOSIS — IMO0002 Reserved for concepts with insufficient information to code with codable children: Secondary | ICD-10-CM

## 2015-01-11 HISTORY — PX: COLPOSCOPY: SHX161

## 2015-01-11 NOTE — Progress Notes (Signed)
Patient ID: Heidi Payne, female   DOB: July 04, 1965, 49 y.o.   MRN: [id number] Colposcopy Procedure Note:  Colposcopy Procedure Note  Indications: Pap smear 1 months ago showed: low-grade squamous intraepithelial neoplasia (LGSIL - encompassing HPV,mild dysplasia,CIN I). The prior pap showed .  Prior cervical/vaginal disease: . Prior cervical treatment: none/not applicable.  Smoker:  No. New sexual partner:  No.  : time frame:    History of abnormal Pap: no  Procedure Details  The risks and benefits of the procedure and Written informed consent obtained.  Speculum placed in vagina and excellent visualization of cervix achieved, cervix swabbed x 3 with acetic acid solution.  Findings: Cervix: visible lesion(s) at 12 12  o'clock, acetowhite lesion(s) noted at 12 o'clock and punctation noted at 12 o'clock; cervical biopsies taken at 12 o'clock. Vaginal inspection: normal without visible lesions. Vulvar colposcopy: vulvar colposcopy not performed.  Specimens: cx bx  Complications: none.  Plan: Follow up path report

## 2015-01-11 NOTE — Addendum Note (Signed)
Addended by: Diona Fanti A on: 01/11/2015 04:52 PM   Modules accepted: Orders

## 2015-01-12 ENCOUNTER — Other Ambulatory Visit: Payer: Self-pay | Admitting: Obstetrics & Gynecology

## 2015-01-19 ENCOUNTER — Telehealth: Payer: Self-pay | Admitting: Obstetrics & Gynecology

## 2015-01-19 NOTE — Telephone Encounter (Signed)
Pt calling requesting results from cervix biopsy from 01/12/2015. Pt requesting Dr. Elonda Husky to call with the results does not want to speak to a nurse.

## 2015-12-02 ENCOUNTER — Other Ambulatory Visit: Payer: Self-pay | Admitting: Adult Health

## 2015-12-02 DIAGNOSIS — Z1231 Encounter for screening mammogram for malignant neoplasm of breast: Secondary | ICD-10-CM

## 2015-12-08 ENCOUNTER — Ambulatory Visit (HOSPITAL_COMMUNITY)
Admission: RE | Admit: 2015-12-08 | Discharge: 2015-12-08 | Disposition: A | Discharge status: Home / Self Care | Payer: Managed Care, Other (non HMO) | Source: Ambulatory Visit | Attending: Adult Health | Admitting: Adult Health

## 2015-12-08 DIAGNOSIS — Z1231 Encounter for screening mammogram for malignant neoplasm of breast: Secondary | ICD-10-CM | POA: Diagnosis present

## 2015-12-30 ENCOUNTER — Observation Stay (HOSPITAL_COMMUNITY): Payer: Managed Care, Other (non HMO)

## 2015-12-30 ENCOUNTER — Inpatient Hospital Stay (HOSPITAL_COMMUNITY)
Admission: AD | Admit: 2015-12-30 | Discharge: 2016-01-01 | DRG: 287 | Disposition: A | Discharge status: Home / Self Care | Payer: Managed Care, Other (non HMO) | Source: Other Acute Inpatient Hospital | Attending: Cardiovascular Disease | Admitting: Cardiovascular Disease

## 2015-12-30 ENCOUNTER — Encounter (HOSPITAL_COMMUNITY): Payer: Self-pay | Admitting: Student

## 2015-12-30 DIAGNOSIS — Z0389 Encounter for observation for other suspected diseases and conditions ruled out: Secondary | ICD-10-CM | POA: Diagnosis not present

## 2015-12-30 DIAGNOSIS — E785 Hyperlipidemia, unspecified: Secondary | ICD-10-CM | POA: Diagnosis present

## 2015-12-30 DIAGNOSIS — Z9114 Patient's other noncompliance with medication regimen: Secondary | ICD-10-CM

## 2015-12-30 DIAGNOSIS — R072 Precordial pain: Secondary | ICD-10-CM | POA: Diagnosis present

## 2015-12-30 DIAGNOSIS — Z79899 Other long term (current) drug therapy: Secondary | ICD-10-CM | POA: Diagnosis not present

## 2015-12-30 DIAGNOSIS — Z6832 Body mass index (BMI) 32.0-32.9, adult: Secondary | ICD-10-CM | POA: Diagnosis not present

## 2015-12-30 DIAGNOSIS — F329 Major depressive disorder, single episode, unspecified: Secondary | ICD-10-CM | POA: Diagnosis present

## 2015-12-30 DIAGNOSIS — K219 Gastro-esophageal reflux disease without esophagitis: Secondary | ICD-10-CM | POA: Diagnosis present

## 2015-12-30 DIAGNOSIS — R079 Chest pain, unspecified: Secondary | ICD-10-CM | POA: Diagnosis not present

## 2015-12-30 DIAGNOSIS — IMO0001 Reserved for inherently not codable concepts without codable children: Secondary | ICD-10-CM

## 2015-12-30 DIAGNOSIS — E7800 Pure hypercholesterolemia, unspecified: Secondary | ICD-10-CM | POA: Diagnosis present

## 2015-12-30 DIAGNOSIS — E669 Obesity, unspecified: Secondary | ICD-10-CM | POA: Diagnosis present

## 2015-12-30 DIAGNOSIS — I200 Unstable angina: Principal | ICD-10-CM | POA: Diagnosis present

## 2015-12-30 HISTORY — DX: Headache, unspecified: R51.9

## 2015-12-30 HISTORY — DX: Depression, unspecified: F32.A

## 2015-12-30 HISTORY — DX: Angina pectoris, unspecified: I20.9

## 2015-12-30 HISTORY — DX: Major depressive disorder, single episode, unspecified: F32.9

## 2015-12-30 HISTORY — DX: Headache: R51

## 2015-12-30 LAB — CBC
HCT: 39.2 % (ref 36.0–46.0)
Hemoglobin: 13 g/dL (ref 12.0–15.0)
MCH: 27.8 pg (ref 26.0–34.0)
MCHC: 33.2 g/dL (ref 30.0–36.0)
MCV: 83.8 fL (ref 78.0–100.0)
PLATELETS: 280 10*3/uL (ref 150–400)
RBC: 4.68 MIL/uL (ref 3.87–5.11)
RDW: 13.2 % (ref 11.5–15.5)
WBC: 11.1 10*3/uL — AB (ref 4.0–10.5)

## 2015-12-30 LAB — BASIC METABOLIC PANEL
Anion gap: 6 (ref 5–15)
BUN: 8 mg/dL (ref 6–20)
CALCIUM: 8.9 mg/dL (ref 8.9–10.3)
CO2: 22 mmol/L (ref 22–32)
Chloride: 109 mmol/L (ref 101–111)
Creatinine, Ser: 0.96 mg/dL (ref 0.44–1.00)
GFR calc Af Amer: >60 mL/min (ref >60)
GLUCOSE: 117 mg/dL — AB (ref 65–99)
Potassium: 3.5 mmol/L (ref 3.5–5.1)
SODIUM: 137 mmol/L (ref 135–145)

## 2015-12-30 LAB — LIPID PANEL
CHOL/HDL RATIO: 5.6 ratio
CHOLESTEROL: 213 mg/dL — AB (ref 0–200)
HDL: 38 mg/dL — ABNORMAL LOW (ref >40)
LDL Cholesterol: 142 mg/dL — ABNORMAL HIGH (ref 0–99)
Triglycerides: 164 mg/dL — ABNORMAL HIGH (ref <150)
VLDL: 33 mg/dL (ref 0–40)

## 2015-12-30 LAB — MRSA PCR SCREENING: MRSA by PCR: NEGATIVE

## 2015-12-30 LAB — TROPONIN I: Troponin I: <0.03 ng/mL (ref <0.03)

## 2015-12-30 LAB — PREGNANCY, URINE: Preg Test, Ur: NEGATIVE

## 2015-12-30 LAB — PROTIME-INR
INR: 1.02
PROTHROMBIN TIME: 13.5 s (ref 11.4–15.2)

## 2015-12-30 MED ORDER — SODIUM CHLORIDE 0.9 % IV SOLN: 250.0000 mL | INTRAVENOUS | Status: DC | PRN

## 2015-12-30 MED ORDER — SODIUM CHLORIDE 0.9% FLUSH
3.0000 mL | Freq: Two times a day (BID) | INTRAVENOUS | Status: DC
  Administered 2015-12-30 – 2015-12-31 (×2): 3 mL via INTRAVENOUS

## 2015-12-30 MED ORDER — ASPIRIN EC 81 MG PO TBEC
81.0000 mg | DELAYED_RELEASE_TABLET | Freq: Every day | ORAL | Status: DC
  Administered 2016-01-01: 81 mg via ORAL
  Filled 2015-12-30: qty 1

## 2015-12-30 MED ORDER — ENOXAPARIN SODIUM 40 MG/0.4ML ~~LOC~~ SOLN: 40.0000 mg | SUBCUTANEOUS | Status: DC

## 2015-12-30 MED ORDER — ENOXAPARIN SODIUM 40 MG/0.4ML ~~LOC~~ SOLN
40.0000 mg | SUBCUTANEOUS | Status: DC
  Administered 2015-12-30: 40 mg via SUBCUTANEOUS
  Filled 2015-12-30: qty 0.4

## 2015-12-30 MED ORDER — NITROGLYCERIN 0.4 MG SL SUBL: 0.4000 mg | SUBLINGUAL_TABLET | SUBLINGUAL | Status: DC | PRN

## 2015-12-30 MED ORDER — PANTOPRAZOLE SODIUM 40 MG PO TBEC
40.0000 mg | DELAYED_RELEASE_TABLET | Freq: Every day | ORAL | Status: DC
Start: 2015-12-31 — End: 2016-01-01
  Administered 2015-12-31 – 2016-01-01 (×2): 40 mg via ORAL
  Filled 2015-12-30 (×2): qty 1

## 2015-12-30 MED ORDER — ALBUTEROL SULFATE (2.5 MG/3ML) 0.083% IN NEBU: 2.5000 mg | INHALATION_SOLUTION | Freq: Four times a day (QID) | RESPIRATORY_TRACT | Status: DC | PRN

## 2015-12-30 MED ORDER — ASPIRIN 81 MG PO CHEW
81.0000 mg | CHEWABLE_TABLET | ORAL | Status: AC
  Administered 2015-12-31: 81 mg via ORAL
  Filled 2015-12-30: qty 1

## 2015-12-30 MED ORDER — MORPHINE SULFATE (PF) 4 MG/ML IV SOLN
4.0000 mg | Freq: Once | INTRAVENOUS | Status: AC
  Administered 2015-12-30: 4 mg via INTRAVENOUS
  Filled 2015-12-30: qty 1

## 2015-12-30 MED ORDER — ONDANSETRON HCL 4 MG/2ML IJ SOLN
4.0000 mg | Freq: Four times a day (QID) | INTRAMUSCULAR | Status: DC | PRN
  Administered 2015-12-30: 4 mg via INTRAVENOUS
  Filled 2015-12-30: qty 2

## 2015-12-30 MED ORDER — SODIUM CHLORIDE 0.9% FLUSH: 3.0000 mL | INTRAVENOUS | Status: DC | PRN

## 2015-12-30 MED ORDER — ALBUTEROL SULFATE HFA 108 (90 BASE) MCG/ACT IN AERS: 2.0000 | INHALATION_SPRAY | Freq: Four times a day (QID) | RESPIRATORY_TRACT | Status: DC | PRN

## 2015-12-30 MED ORDER — SIMVASTATIN 20 MG PO TABS
20.0000 mg | ORAL_TABLET | Freq: Every day | ORAL | Status: DC
  Administered 2015-12-30 – 2015-12-31 (×2): 20 mg via ORAL
  Filled 2015-12-30 (×2): qty 1

## 2015-12-30 MED ORDER — SODIUM CHLORIDE 0.9 % WEIGHT BASED INFUSION
1.0000 mL/kg/h | INTRAVENOUS | Status: DC
  Administered 2015-12-31: 1 mL/kg/h via INTRAVENOUS

## 2015-12-30 MED ORDER — ACETAMINOPHEN 325 MG PO TABS
650.0000 mg | ORAL_TABLET | ORAL | Status: DC | PRN
  Administered 2015-12-30 – 2016-01-01 (×6): 650 mg via ORAL
  Filled 2015-12-30 (×5): qty 2

## 2015-12-30 MED ORDER — IOPAMIDOL (ISOVUE-370) INJECTION 76%
INTRAVENOUS | Status: AC
  Administered 2015-12-30: 100 mL
  Filled 2015-12-30: qty 100

## 2015-12-30 MED ORDER — MORPHINE SULFATE (PF) 2 MG/ML IV SOLN
1.0000 mg | INTRAVENOUS | Status: DC | PRN
  Administered 2015-12-30 – 2015-12-31 (×3): 1 mg via INTRAVENOUS
  Filled 2015-12-30 (×3): qty 1

## 2015-12-30 MED ORDER — MORPHINE SULFATE (PF) 4 MG/ML IV SOLN
INTRAVENOUS | Status: AC
  Administered 2015-12-30: 4 mg via INTRAVENOUS
  Filled 2015-12-30: qty 1

## 2015-12-30 MED ORDER — METOPROLOL TARTRATE 25 MG PO TABS
25.0000 mg | ORAL_TABLET | Freq: Two times a day (BID) | ORAL | Status: DC
  Administered 2015-12-30 – 2016-01-01 (×4): 25 mg via ORAL
  Filled 2015-12-30 (×4): qty 1

## 2015-12-30 MED ORDER — SODIUM CHLORIDE 0.9 % WEIGHT BASED INFUSION
3.0000 mL/kg/h | INTRAVENOUS | Status: DC
  Administered 2015-12-31: 3 mL/kg/h via INTRAVENOUS

## 2015-12-30 MED ORDER — NITROGLYCERIN IN D5W 200-5 MCG/ML-% IV SOLN
0.0000 ug/min | INTRAVENOUS | Status: DC
Start: 2015-12-30 — End: 2015-12-31
  Administered 2015-12-31: 40 ug/min via INTRAVENOUS
  Filled 2015-12-30: qty 250

## 2015-12-30 NOTE — H&P (Signed)
History & Physical    Patient ID: CHESNEY GOLDE MRN: [id number], DOB/AGE: [birth year]   Admit date: 12/30/2015  Primary Physician: Gar Ponto, MD Primary Cardiologist: New - Dr. Claiborne Billings  History of Present Illness    Heidi Payne is a 50 y.o. female with past medical history of HLD, GERD, and depression who presented to [facility] on 12/30/2015 for chest pain and was transferred to [facility] for further evaluation.  Patient reports she woke up at 0500 and noticed a pressure in her chest. Says it felt like something was sitting on her. No associated nausea, vomiting, dyspnea or diaphoresis. The pain persisted so she called her father who took her to the ED.  Pain has been constant since 0500. She was given IV Morphine at [facility] with improvement from a 7/10 to 5/10. Says it only helped for a few minutes. She now says the pain is a 6/10. Currently on a NTG drip at 65mcg/min. Pain is not worse with walking or positional changes. Not reproducible on palpation.  She denies any prior episodes of chest discomfort with activity. She was push-mowing the yard yesterday and says she had palpitations and was very short of breath. Notes it was very warm outside but says she had not experienced dyspnea like this before. Denies any chest pain at that time.  No personal history of CAD. Does have HLD and takes Simvastatin. No HTN or Type 2 DM. No family history of CAD. Father has HTN and DM. No prior tobacco use or excessive alcohol use. No recreational drug use.   Labs from OSH showed a WBC of 5.9, Hgb 14.8, platelets 297. Na+ 143, K+ 3.5, creatinine 0.70.Troponin < 0.01. EKG shows NSR, HR 96, with no acute ST or T-wave changes. Repeat EKG's obtained with PAC's noted but no acute ischemic changes.   Past Medical History    Past Medical History:  Diagnosis Date  . GERD (gastroesophageal reflux disease)   . Hyperlipidemia   . Mental disorder    depression.  . Vitiligo     Past  Surgical History:  Procedure Laterality Date  . APPENDECTOMY     1975  . CESAREAN SECTION W/BTL  2005  . CHOLECYSTECTOMY  11/27/2011   Procedure: LAPAROSCOPIC CHOLECYSTECTOMY;  Surgeon: Jamesetta So, MD;  Location: [facility] ORS;  Service: General;  Laterality: N/A;  . COLONOSCOPY  03/19/2012   Procedure: COLONOSCOPY;  Surgeon: Jamesetta So, MD;  Location: [facility] ENDO SUITE;  Service: Gastroenterology;  Laterality: N/A;  . DILATION AND CURETTAGE OF UTERUS    . TUBAL LIGATION       Allergies  No Known Allergies   Home Medications    Prior to Admission medications   Medication Sig Start Date End Date Taking? Authorizing Provider  albuterol (PROVENTIL HFA;VENTOLIN HFA) 108 (90 Base) MCG/ACT inhaler Inhale 2 puffs into the lungs every 6 (six) hours as needed for wheezing or shortness of breath.   Yes Historical Provider, MD  betamethasone dipropionate (DIPROLENE) 0.05 % cream Apply 1 application topically 2 (two) times daily as needed. For finger   Yes Historical Provider, MD  ibuprofen (ADVIL,MOTRIN) 200 MG tablet Take 200 mg by mouth as needed for moderate pain.    Yes Historical Provider, MD  loratadine (CLARITIN) 10 MG tablet Take 10 mg by mouth daily as needed. For allergies   Yes Historical Provider, MD  pantoprazole (PROTONIX) 40 MG tablet Take 40 mg by mouth daily. 12/07/14  Yes Historical Provider, MD  simvastatin (ZOCOR) 20 MG tablet Take 1 tablet (20 mg total) by mouth daily. 01/01/15   Estill Dooms, NP    Family History    Family History  Problem Relation Age of Onset  . Cancer Mother     bone brain cervical  . Cancer Father     colon  . Diabetes Father   . Hypertension Father   . Cancer Maternal Aunt     breast  . Cancer Paternal Aunt     esophageal, lung  . Cancer Maternal Aunt     breast  . Cancer Maternal Aunt     breast    Social History    Social History   Social History  . Marital status: Married    Spouse name: N/A  . Number of children: N/A  .  Years of education: N/A   Occupational History  . Not on file.   Social History Main Topics  . Smoking status: Never Smoker  . Smokeless tobacco: Never Used  . Alcohol use Yes     Comment: occassionally  . Drug use: No  . Sexual activity: Yes    Birth control/ protection: Surgical     Comment: tubal   Other Topics Concern  . Not on file   Social History Narrative  . No narrative on file     Review of Systems    General:  No chills, fever, night sweats or weight changes.  Cardiovascular:  No edema, orthopnea, palpitations, paroxysmal nocturnal dyspnea. Positive for chest pain and dyspnea.  Dermatological: No rash, lesions/masses Respiratory: No cough, dyspnea Urologic: No hematuria, dysuria Abdominal:   No nausea, vomiting, diarrhea, bright red blood per rectum, melena, or hematemesis Neurologic:  No visual changes, wkns, changes in mental status. All other systems reviewed and are otherwise negative except as noted above.  Physical Exam    Blood pressure 126/89, pulse 83, temperature 97.7 F (36.5 C), temperature source Oral, resp. rate 13, height 5\' 6"  (1.676 m), weight 198 lb 10.2 oz (90.1 kg), last menstrual period 12/08/2015, SpO2 98 %.  General: Well developed, well nourished,female appearing in no acute distress. Head: Normocephalic, atraumatic, sclera non-icteric, no xanthomas, nares are without discharge. Dentition:  Neck: No carotid bruits. JVD not elevated.  Lungs: Respirations regular and unlabored, without wheezes or rales.  Heart: Regular rate and rhythm. No S3 or S4.  No murmur, no rubs, or gallops appreciated. Abdomen: Soft, non-tender, non-distended with normoactive bowel sounds. No hepatomegaly. No rebound/guarding. No obvious abdominal masses. Msk:  Strength and tone appear normal for age. No joint deformities or effusions. Extremities: No clubbing or cyanosis. No edema.  Distal pedal pulses are 2+ bilaterally. Neuro: Alert and oriented X 3. Moves all  extremities spontaneously. No focal deficits noted. Psych:  Responds to questions appropriately with a normal affect. Skin: No rashes or lesions noted  Labs    Labs from OSH showed a WBC of 5.9, Hgb 14.8, platelets 297. Na+ 143, K+ 3.5, creatinine 0.70. Troponin < 0.01.  Radiology Studies    Mm Digital Screening Bilateral  Result Date: 12/14/2015 CLINICAL DATA:  Screening. EXAM: DIGITAL SCREENING BILATERAL MAMMOGRAM WITH CAD COMPARISON:  Previous exam(s). ACR Breast Density Category c: The breast tissue is heterogeneously dense, which may obscure small masses. FINDINGS: There are no findings suspicious for malignancy. Images were processed with CAD. IMPRESSION: No mammographic evidence of malignancy. A result letter of this screening mammogram will be mailed directly to the patient. RECOMMENDATION: Screening mammogram in one year. (Code:SM-B-01Y)  BI-RADS CATEGORY  1: Negative. Electronically Signed   By: Everlean Alstrom M.D.   On: 12/14/2015 08:24    EKG & Cardiac Imaging    EKG: NSR, HR 96, with no acute ST or T-wave changes. Repeat EKG's obtained with PAC's noted but no acute ischemic changes.   ECHOCARDIOGRAM: Pending  Assessment & Plan    1. Chest pain with moderate risk for cardiac etiology - developed chest pressure at 0500 this AM, saying she feels like something is sitting on her chest. Completely different from previous GERD. Has been constant since onset with no relief despite IV Morphine and IV NTG. No associated symptoms and not worse with activity. Of note, she did develop palpitations and dyspnea when push-mowing the yard yesterday evening which has never occurred before. - cardiac risk factors include HLD. No history of HTN, Type 2 DM, family history of CAD, or tobacco use. - initial troponin negative and initial and repeat EKG's are without acute ischemic changes.  - still having 6/10 pain. Keep on IV NTG drip, may titrate down if having no relief in pain. PRN Morphine.  -  will cycle troponin values. Start Heparin if values become elevated. Repeat EKG in AM.  - symptoms have mixed typical and atypical features, but with her continued chest discomfort, would likely benefit from a  definitive evaluation with a cardiac catheterization. Procedure explained in detail to the patient and her husband. The patient understands that risks include but are not limited to stroke (1 in 1000), death (1 in 67), kidney failure [usually temporary] (1 in 500), bleeding (1 in 200), allergic reaction [possibly serious] (1 in 200). Dr. Claiborne Billings to see for final assessment.  2. HLD (hyperlipidemia) - on Simvastatin 20mg  daily prior to admission - will check Lipid Panel in AM.   Signed, Erma Heritage, PA-C 12/30/2015, 3:32 PM Pager: [phone]  Patient seen and examined. Agree with assessment and plan.  Heidi Payne is a 50 year old Caucasian female who denies any known awareness of prior cardiac disease.  She has a history of lipidemia and has been on simvastatin 20 mg daily.  Last evening she was cutting her grass and noticed the development of increasing heart rate and palpitations associated with increasing exertionally precipitated shortness of breath.  Her discomfort ultimately abated. This morning upon awakening at approximately 5 AM she experienced chest tightness and pressure.  This persisted and was associated with increasing shortness of breath.  She presented to [facility].  Her ECG was not definitive and showed mild nondiagnostic T change in lead 3.  She continues to experience chest tightness and pressure and for this reason was transferred to [facility] hospital for further evaluation.  She denies any history of hypertension or diabetes mellitus.  There is no prior tobacco use.  On arrival to [facility].  She was on nitroglycerin paste which was removed.  She is now on IV nitroglycerin at 40 g.  A repeat ECG shows normal sinus rhythm at 96 bpm with nondiagnostic T change in  lead 3.  The patient has a history of mild obesity.  She is normocephalic and atraumatic.  Her resting pulse is in the 90s.  Her blood pressure is 126/89.  H&T is unremarkable.  There are no carotid bruits.  There is no JVD.  Her lungs were clear.  She did not have chest wall tenderness to palpation.  Rhythm was regular with a 1/6 systolic murmur.  Her breasts were fairly large.  She had central adiposity.  She did not have a panel jugular reflux.  There was no hepatosplenomegaly.  Abdomen was nontender.  There is no clubbing, cyanosis or edema.  Neurologic exam was nonfocal.  The patient states she also has a history of GERD, but the chest tightness that she experienced is completely different than her prior reflux.  Presently, we will cycle enzymes.  With her continued chest pain, I have discussed with her further options concerning evaluation for ischemic heart disease including a noninvasive strategy versus definitive cardiac catheterization..  With her body habitus and potential for artifact on nuclear imaging, and with her ongoing symptoms I have recommended definitive cardiac catheterization.  She will be NPO post midnight.  I will start her on beta blocker therapy with metoprolol tartrate 25 mg twice a day.  The risks and benefits of a cardiac catheterization including, but not limited to, death, stroke, MI, kidney damage and bleeding were discussed with the patient who indicates understanding and agrees to proceed.    Troy Sine, MD, [facility] 12/30/2015 4:05 PM

## 2015-12-30 NOTE — Progress Notes (Signed)
Mrs. Heidi Payne was having 5/10 chest pain at 1900.  1 mg morphine given.  Pain increased to 8/10 after about 45 min.  Cardiology called.  Dr. Tommi Rumps ordered 4 mg of morphine and came to see her.  We will be taking her to CT to rule out any other issues such as PE or dissection. She is going to the cath lab tomorrow and has a negative ECG and negative troponin's. Will continue to monitor closely.

## 2015-12-31 ENCOUNTER — Observation Stay (HOSPITAL_COMMUNITY): Payer: Managed Care, Other (non HMO)

## 2015-12-31 ENCOUNTER — Encounter (HOSPITAL_COMMUNITY)
Admission: AD | Disposition: A | Discharge status: Home / Self Care | Payer: Self-pay | Source: Other Acute Inpatient Hospital | Attending: Cardiovascular Disease

## 2015-12-31 DIAGNOSIS — K219 Gastro-esophageal reflux disease without esophagitis: Secondary | ICD-10-CM

## 2015-12-31 DIAGNOSIS — I200 Unstable angina: Secondary | ICD-10-CM

## 2015-12-31 DIAGNOSIS — R079 Chest pain, unspecified: Secondary | ICD-10-CM

## 2015-12-31 HISTORY — PX: CARDIAC CATHETERIZATION: SHX172

## 2015-12-31 LAB — ECHOCARDIOGRAM COMPLETE
Height: 66 in
Weight: 3180.8 oz

## 2015-12-31 LAB — TROPONIN I

## 2015-12-31 SURGERY — LEFT HEART CATH AND CORONARY ANGIOGRAPHY
Anesthesia: LOCAL

## 2015-12-31 MED ORDER — HEPARIN (PORCINE) IN NACL 100-0.45 UNIT/ML-% IJ SOLN
1200.0000 [IU]/h | INTRAMUSCULAR | Status: DC
  Administered 2015-12-31: 1200 [IU]/h via INTRAVENOUS
  Filled 2015-12-31: qty 250

## 2015-12-31 MED ORDER — HEPARIN (PORCINE) IN NACL 2-0.9 UNIT/ML-% IJ SOLN
INTRAMUSCULAR | Status: AC
  Filled 2015-12-31: qty 1000

## 2015-12-31 MED ORDER — ACETAMINOPHEN 325 MG PO TABS
650.0000 mg | ORAL_TABLET | ORAL | Status: DC | PRN
  Administered 2016-01-01: 09:00:00 650 mg via ORAL
  Filled 2015-12-31 (×2): qty 2

## 2015-12-31 MED ORDER — HEPARIN (PORCINE) IN NACL 2-0.9 UNIT/ML-% IJ SOLN
INTRAMUSCULAR | Status: DC | PRN
  Administered 2015-12-31: 1500 mL via INTRA_ARTERIAL

## 2015-12-31 MED ORDER — LIDOCAINE HCL (PF) 1 % IJ SOLN
INTRAMUSCULAR | Status: DC | PRN
  Administered 2015-12-31: 1 mL

## 2015-12-31 MED ORDER — ZOLPIDEM TARTRATE 5 MG PO TABS
5.0000 mg | ORAL_TABLET | Freq: Every evening | ORAL | Status: DC | PRN
  Administered 2015-12-31: 5 mg via ORAL
  Filled 2015-12-31: qty 1

## 2015-12-31 MED ORDER — ANGIOPLASTY BOOK
Freq: Once | Status: AC
  Administered 2016-01-01
  Filled 2015-12-31: qty 1

## 2015-12-31 MED ORDER — VERAPAMIL HCL 2.5 MG/ML IV SOLN
INTRAVENOUS | Status: AC
  Filled 2015-12-31: qty 2

## 2015-12-31 MED ORDER — SODIUM CHLORIDE 0.9 % WEIGHT BASED INFUSION: 1.0000 mL/kg/h | INTRAVENOUS | Status: AC

## 2015-12-31 MED ORDER — ONDANSETRON HCL 4 MG/2ML IJ SOLN: 4.0000 mg | Freq: Four times a day (QID) | INTRAMUSCULAR | Status: DC | PRN

## 2015-12-31 MED ORDER — MIDAZOLAM HCL 2 MG/2ML IJ SOLN
INTRAMUSCULAR | Status: DC | PRN
  Administered 2015-12-31: 2 mg via INTRAVENOUS

## 2015-12-31 MED ORDER — FENTANYL CITRATE (PF) 100 MCG/2ML IJ SOLN
INTRAMUSCULAR | Status: AC
  Filled 2015-12-31: qty 2

## 2015-12-31 MED ORDER — HEPARIN SODIUM (PORCINE) 1000 UNIT/ML IJ SOLN
INTRAMUSCULAR | Status: AC
  Filled 2015-12-31: qty 1

## 2015-12-31 MED ORDER — HEPARIN SODIUM (PORCINE) 1000 UNIT/ML IJ SOLN
INTRAMUSCULAR | Status: DC | PRN
  Administered 2015-12-31: 4000 [IU] via INTRAVENOUS

## 2015-12-31 MED ORDER — SODIUM CHLORIDE 0.9% FLUSH
3.0000 mL | Freq: Two times a day (BID) | INTRAVENOUS | Status: DC
  Administered 2016-01-01: 3 mL via INTRAVENOUS

## 2015-12-31 MED ORDER — SODIUM CHLORIDE 0.9% FLUSH: 3.0000 mL | INTRAVENOUS | Status: DC | PRN

## 2015-12-31 MED ORDER — HEPARIN (PORCINE) IN NACL 2-0.9 UNIT/ML-% IJ SOLN
INTRAMUSCULAR | Status: AC
  Filled 2015-12-31: qty 500

## 2015-12-31 MED ORDER — HEPARIN BOLUS VIA INFUSION
4000.0000 [IU] | Freq: Once | INTRAVENOUS | Status: AC
  Administered 2015-12-31: 4000 [IU] via INTRAVENOUS
  Filled 2015-12-31: qty 4000

## 2015-12-31 MED ORDER — IOPAMIDOL (ISOVUE-370) INJECTION 76%
INTRAVENOUS | Status: DC | PRN
  Administered 2015-12-31: 80 mL via INTRA_ARTERIAL

## 2015-12-31 MED ORDER — MIDAZOLAM HCL 2 MG/2ML IJ SOLN
INTRAMUSCULAR | Status: AC
  Filled 2015-12-31: qty 2

## 2015-12-31 MED ORDER — FENTANYL CITRATE (PF) 100 MCG/2ML IJ SOLN
INTRAMUSCULAR | Status: DC | PRN
  Administered 2015-12-31: 25 ug via INTRAVENOUS

## 2015-12-31 MED ORDER — LIDOCAINE HCL (PF) 1 % IJ SOLN
INTRAMUSCULAR | Status: AC
  Filled 2015-12-31: qty 30

## 2015-12-31 MED ORDER — SODIUM CHLORIDE 0.9 % IV SOLN: 250.0000 mL | INTRAVENOUS | Status: DC | PRN

## 2015-12-31 MED ORDER — VERAPAMIL HCL 2.5 MG/ML IV SOLN
INTRAVENOUS | Status: DC | PRN
  Administered 2015-12-31: 10 mL via INTRA_ARTERIAL

## 2015-12-31 MED ORDER — IOPAMIDOL (ISOVUE-370) INJECTION 76%
INTRAVENOUS | Status: AC
  Filled 2015-12-31: qty 100

## 2015-12-31 MED ORDER — ALUM & MAG HYDROXIDE-SIMETH 200-200-20 MG/5ML PO SUSP
30.0000 mL | ORAL | Status: DC | PRN
  Administered 2015-12-31: 30 mL via ORAL
  Filled 2015-12-31 (×2): qty 30

## 2015-12-31 MED FILL — Nitroglycerin IV Soln 200 MCG/ML in D5W: INTRAVENOUS | Qty: 250 | Status: AC

## 2015-12-31 MED FILL — Fentanyl Citrate Preservative Free (PF) Inj 100 MCG/2ML: INTRAMUSCULAR | Qty: 2 | Status: AC

## 2015-12-31 SURGICAL SUPPLY — 11 items
CATH IMPULSE 5F ANG/FL3.5 (CATHETERS) ×1 IMPLANT
CATH LAUNCHER 5F EBU3.0 (CATHETERS) IMPLANT
CATHETER LAUNCHER 5F EBU3.0 (CATHETERS) ×2
DEVICE RAD COMP TR BAND LRG (VASCULAR PRODUCTS) ×1 IMPLANT
GLIDESHEATH SLEND SS 6F .021 (SHEATH) ×1 IMPLANT
KIT HEART LEFT (KITS) ×2 IMPLANT
PACK CARDIAC CATHETERIZATION (CUSTOM PROCEDURE TRAY) ×2 IMPLANT
SYR MEDRAD MARK V 150ML (SYRINGE) ×2 IMPLANT
TRANSDUCER W/STOPCOCK (MISCELLANEOUS) ×2 IMPLANT
TUBING CIL FLEX 10 FLL-RA (TUBING) ×2 IMPLANT
WIRE SAFE-T 1.5MM-J .035X260CM (WIRE) ×1 IMPLANT

## 2015-12-31 NOTE — Progress Notes (Signed)
Subjective:  Still with 5/10 chest tightness  Objective:   Vital Signs : Vitals:   12/31/15 0400 12/31/15 0500 12/31/15 0600 12/31/15 0700  BP: 98/61 (!) 88/62 95/64 (!) 93/59  Pulse: 91 76 86 77  Resp: _0 Temp: 99 F (37.2 C)     TempSrc: Oral     SpO2: 98% 98% 98% 99%  Weight:      Height:        Intake/Output from previous day:  Intake/Output Summary (Last 24 hours) at 12/31/15 0805 Last data filed at 12/31/15 0700  Gross per 24 hour  Intake          1269.73 ml  Output              600 ml  Net           669.73 ml      I/O since admission: +669  Wt Readings from Last 3 Encounters:  12/31/15 198 lb 12.8 oz (90.2 kg)  01/11/15 197 lb (89.4 kg)  12/31/14 197 lb (89.4 kg)    Medications: . aspirin EC  81 mg Oral Daily  . enoxaparin (LOVENOX) injection  40 mg Subcutaneous Q24H  . metoprolol tartrate  25 mg Oral BID  . pantoprazole  40 mg Oral Daily  . simvastatin  20 mg Oral q1800  . sodium chloride flush  3 mL Intravenous Q12H    . sodium chloride 1 mL/kg/hr (12/31/15 0722)  . nitroGLYCERIN 40 mcg/min (12/31/15 0749)    Physical Exam:   General appearance: alert, cooperative and no distress Neck: no adenopathy, no carotid bruit, no JVD, supple, symmetrical, trachea midline and thyroid not enlarged, symmetric, no tenderness/mass/nodules Lungs: clear to auscultation bilaterally Heart: regular rate and rhythm Abdomen: soft, non-tender; bowel sounds normal; no masses,  no organomegaly Extremities: extremities normal, atraumatic, no cyanosis or edema Pulses: 2+ and symmetric Neurologic: Grossly normal   Rate: 83  Rhythm: normal sinus rhythm  12/31/15 ECG (independently read by me): NSR at 83; Mild T wave inversion in V3 new this am   ECG (independently read by me): NSR with normal T waves   Lab Results:   Recent Labs  12/30/15 2123  NA 137  K 3.5  CL 109  CO2 22  GLUCOSE 117*  BUN 8  CREATININE 0.96  CALCIUM 8.9    Hepatic  Function Latest Ref Rng & Units 12/31/2014 06/13/2013 11/23/2011  Total Protein 6.0 - 8.5 g/dL 6.7 6.6 6.8  Albumin 3.5 - 5.5 g/dL 4.5 4.4 4.1  AST 0 - 40 IU/L _1 ALT 0 - 32 IU/L 45(H) 22 26  Alk Phosphatase 39 - 117 IU/L 64 50 63  Total Bilirubin 0.0 - 1.2 mg/dL 0.4 0.4 0.3  Bilirubin, Direct 0.0 - 0.3 mg/dL - - <0.1     Recent Labs  12/30/15 2123  WBC 11.1*  HGB 13.0  HCT 39.2  MCV 83.8  PLT 280     Recent Labs  12/30/15 1721 12/30/15 2123 12/31/15 0301  TROPONINI <0.03 <0.03 <0.03    Lab Results  Component Value Date   TSH 1.230 12/31/2014   No results for input(s): HGBA1C in the last 72 hours.  No results for input(s): PROT, ALBUMIN, AST, ALT, ALKPHOS, BILITOT, BILIDIR, IBILI in the last 72 hours.  Recent Labs  12/30/15 2123  INR 1.02   BNP (last 3 results) No results for input(s): BNP in the last 8760 hours.  ProBNP (last 3  results) No results for input(s): PROBNP in the last 8760 hours.   Lipid Panel     Component Value Date/Time   CHOL 213 (H) 12/30/2015 2123   CHOL 236 (H) 12/31/2014 1009   TRIG 164 (H) 12/30/2015 2123   HDL 38 (L) 12/30/2015 2123   HDL 43 12/31/2014 1009   CHOLHDL 5.6 12/30/2015 2123   VLDL 33 12/30/2015 2123   LDLCALC 142 (H) 12/30/2015 2123   LDLCALC 160 (H) 12/31/2014 1009      Imaging:  Ct Angio Chest Aorta W/cm &/or Wo/cm  Result Date: 12/31/2015 CLINICAL DATA:  Persistent severe chest pain with normal ECG and troponins. EXAM: CT ANGIOGRAPHY CHEST WITH CONTRAST TECHNIQUE: Multidetector CT imaging of the chest was performed using the standard protocol during bolus administration of intravenous contrast. Multiplanar CT image reconstructions and MIPs were obtained to evaluate the vascular anatomy. CONTRAST:  100 mL Isovue 370 COMPARISON:  None. FINDINGS: Noncontrast images demonstrate normal caliber thoracic aorta. No significant aortic calcification. No evidence of intramural hematoma. Images obtained during  arterial phase after intravenous contrast administration demonstrates patent normal caliber thoracic aorta. No evidence of aortic dissection. Great vessel origins are patent. Normal heart size. Central pulmonary arteries are well opacified without evidence of significant pulmonary embolus. Esophagus is decompressed. No significant lymphadenopathy in the chest. Scattered calcified lymph nodes are present. Evaluation of lungs is limited due to respiratory motion artifact. Mild patchy reticular nodular changes and slight mosaic attenuation pattern may indicate mild interstitial edema or interstitial pneumonitis. No focal consolidation. No pleural effusions. No pneumothorax. Peribronchial thickening suggesting small airways disease. Included portions of the upper abdominal organs demonstrate multiple low-attenuation lesions throughout the liver consistent with multiple up attic cysts. Degenerative changes in the spine. No destructive bone lesions. Review of the MIP images confirms the above findings. IMPRESSION: No evidence of aortic aneurysm or dissection. No evidence of significant pulmonary embolus. Interstitial changes in the lungs may represent edema or interstitial pneumonitis. No focal consolidation. Airways thickening. Benign-appearing hepatic cysts. Electronically Signed   By: Lucienne Capers M.D.   On: 12/31/2015 00:49      Assessment/Plan:   Active Problems:   Chest pain with moderate risk for cardiac etiology   HLD (hyperlipidemia)   Precordial pain   1. Chest pain: ? USAP; trop neg; new T abnormality in V3 this am; on iv NTG and metoprolol; with T abnl will heparinize. Chest CT neg for aortic dissection or PE.  Plan for definitive cath today.  I have reviewed the risks, indications, and alternatives to cardiac catheterization, possible angioplasty, and stenting with the patient. Risks include but are not limited to bleeding, infection, vascular injury, stroke, myocardial infection, arrhythmia,  kidney injury, radiation-related injury in the case of prolonged fluoroscopy use, emergency cardiac surgery, and death. The patient understands the risks of serious complication is 1-2 in 8250 with diagnostic cardiac cath and 1-2% or less with angioplasty/stenting.   2. Hyperlipidemia:  Pt has been on simvastatin;  LDL elevaterd at 142; will dc simvastatin and start atorvastatin 80 mg;  Check LFTs since min ALT elevation 1 year ago.   3. GERD: on pantoprazole  4. Mild obesity with BMI 32.1; will need weight loss.   Troy Sine, MD, [facility] 12/31/2015, 8:05 AM

## 2015-12-31 NOTE — Progress Notes (Signed)
ANTICOAGULATION CONSULT NOTE - Initial Consult  Pharmacy Consult for heparin Indication: chest pain/ACS  No Known Allergies  Patient Measurements: Height: 5\' 6"  (167.6 cm) Weight: 198 lb 12.8 oz (90.2 kg) IBW/kg (Calculated) : 59.3 Heparin Dosing Weight: 90  Vital Signs: Temp: 98.8 F (37.1 C) (08/18 0700) Temp Source: Oral (08/18 0700) BP: 93/59 (08/18 0700) Pulse Rate: 77 (08/18 0700)  Labs:  Recent Labs  12/30/15 1721 12/30/15 2123 12/31/15 0301  HGB  --  13.0  --   HCT  --  39.2  --   PLT  --  280  --   LABPROT  --  13.5  --   INR  --  1.02  --   CREATININE  --  0.96  --   TROPONINI <0.03 <0.03 <0.03    Estimated Creatinine Clearance: 79.4 mL/min (by C-G formula based on SCr of 0.96 mg/dL).   Medical History: Past Medical History:  Diagnosis Date  . GERD (gastroesophageal reflux disease)   . Hyperlipidemia   . Mental disorder    depression.  . Vitiligo     Assessment: 50 yo female admitted with chest pain with plans to go to [facility] today, pharmacy consulted to dose heparin until that time. CBC wnl.  Goal of Therapy:  Heparin level 0.3-0.7 units/ml Monitor platelets by anticoagulation protocol: Yes   Plan:  Give 4000 units bolus x 1 Start heparin infusion at 1200 units/hr Continue to monitor H&H and platelets  Follow-up post cath for heparin plans and if any levels are indicated   Melburn Popper, PharmD Clinical Pharmacy Resident Pager: [phone] 12/31/15 10:04 AM

## 2015-12-31 NOTE — Interval H&P Note (Signed)
History and Physical Interval Note:  12/31/2015 6:43 PM  Heidi Payne  has presented today for surgery, with the diagnosis of Chest Pain  The various methods of treatment have been discussed with the patient and family. After consideration of risks, benefits and other options for treatment, the patient has consented to  Procedure(s): Left Heart Cath and Coronary Angiography (N/A) as a surgical intervention .  The patient's history has been reviewed, patient examined, no change in status, stable for surgery.  I have reviewed the patient's chart and labs.  Questions were answered to the patient's satisfaction.    Cath Lab Visit (complete for each Cath Lab visit)  Clinical Evaluation Leading to the Procedure:   ACS: Yes.    Non-ACS:    Anginal Classification: CCS IV  Anti-ischemic medical therapy: No Therapy  Non-Invasive Test Results: No non-invasive testing performed  Prior CABG: No previous CABG        Heidi Payne

## 2015-12-31 NOTE — H&P (View-Only) (Signed)
 Subjective:  Still with 5/10 chest tightness  Objective:   Vital Signs : Vitals:   12/31/15 0400 12/31/15 0500 12/31/15 0600 12/31/15 0700  BP: 98/61 (!) 88/62 95/64 (!) 93/59  Pulse: 91 76 86 77  Resp: 19 15 13 14  Temp: 99 F (37.2 C)     TempSrc: Oral     SpO2: 98% 98% 98% 99%  Weight:      Height:        Intake/Output from previous day:  Intake/Output Summary (Last 24 hours) at 12/31/15 0805 Last data filed at 12/31/15 0700  Gross per 24 hour  Intake          1269.73 ml  Output              600 ml  Net           669.73 ml      I/O since admission: +669  Wt Readings from Last 3 Encounters:  12/31/15 198 lb 12.8 oz (90.2 kg)  01/11/15 197 lb (89.4 kg)  12/31/14 197 lb (89.4 kg)    Medications: . aspirin EC  81 mg Oral Daily  . enoxaparin (LOVENOX) injection  40 mg Subcutaneous Q24H  . metoprolol tartrate  25 mg Oral BID  . pantoprazole  40 mg Oral Daily  . simvastatin  20 mg Oral q1800  . sodium chloride flush  3 mL Intravenous Q12H    . sodium chloride 1 mL/kg/hr (12/31/15 0722)  . nitroGLYCERIN 40 mcg/min (12/31/15 0749)    Physical Exam:   General appearance: alert, cooperative and no distress Neck: no adenopathy, no carotid bruit, no JVD, supple, symmetrical, trachea midline and thyroid not enlarged, symmetric, no tenderness/mass/nodules Lungs: clear to auscultation bilaterally Heart: regular rate and rhythm Abdomen: soft, non-tender; bowel sounds normal; no masses,  no organomegaly Extremities: extremities normal, atraumatic, no cyanosis or edema Pulses: 2+ and symmetric Neurologic: Grossly normal   Rate: 83  Rhythm: normal sinus rhythm  12/31/15 ECG (independently read by me): NSR at 83; Mild T wave inversion in V3 new this am   ECG (independently read by me): NSR with normal T waves   Lab Results:   Recent Labs  12/30/15 2123  NA 137  K 3.5  CL 109  CO2 22  GLUCOSE 117*  BUN 8  CREATININE 0.96  CALCIUM 8.9    Hepatic  Function Latest Ref Rng & Units 12/31/2014 06/13/2013 11/23/2011  Total Protein 6.0 - 8.5 g/dL 6.7 6.6 6.8  Albumin 3.5 - 5.5 g/dL 4.5 4.4 4.1  AST 0 - 40 IU/L 28 17 18  ALT 0 - 32 IU/L 45(H) 22 26  Alk Phosphatase 39 - 117 IU/L 64 50 63  Total Bilirubin 0.0 - 1.2 mg/dL 0.4 0.4 0.3  Bilirubin, Direct 0.0 - 0.3 mg/dL - - <0.1     Recent Labs  12/30/15 2123  WBC 11.1*  HGB 13.0  HCT 39.2  MCV 83.8  PLT 280     Recent Labs  12/30/15 1721 12/30/15 2123 12/31/15 0301  TROPONINI <0.03 <0.03 <0.03    Lab Results  Component Value Date   TSH 1.230 12/31/2014   No results for input(s): HGBA1C in the last 72 hours.  No results for input(s): PROT, ALBUMIN, AST, ALT, ALKPHOS, BILITOT, BILIDIR, IBILI in the last 72 hours.  Recent Labs  12/30/15 2123  INR 1.02   BNP (last 3 results) No results for input(s): BNP in the last 8760 hours.  ProBNP (last 3   results) No results for input(s): PROBNP in the last 8760 hours.   Lipid Panel     Component Value Date/Time   CHOL 213 (H) 12/30/2015 2123   CHOL 236 (H) 12/31/2014 1009   TRIG 164 (H) 12/30/2015 2123   HDL 38 (L) 12/30/2015 2123   HDL 43 12/31/2014 1009   CHOLHDL 5.6 12/30/2015 2123   VLDL 33 12/30/2015 2123   LDLCALC 142 (H) 12/30/2015 2123   LDLCALC 160 (H) 12/31/2014 1009      Imaging:  Ct Angio Chest Aorta W/cm &/or Wo/cm  Result Date: 12/31/2015 CLINICAL DATA:  Persistent severe chest pain with normal ECG and troponins. EXAM: CT ANGIOGRAPHY CHEST WITH CONTRAST TECHNIQUE: Multidetector CT imaging of the chest was performed using the standard protocol during bolus administration of intravenous contrast. Multiplanar CT image reconstructions and MIPs were obtained to evaluate the vascular anatomy. CONTRAST:  100 mL Isovue 370 COMPARISON:  None. FINDINGS: Noncontrast images demonstrate normal caliber thoracic aorta. No significant aortic calcification. No evidence of intramural hematoma. Images obtained during  arterial phase after intravenous contrast administration demonstrates patent normal caliber thoracic aorta. No evidence of aortic dissection. Great vessel origins are patent. Normal heart size. Central pulmonary arteries are well opacified without evidence of significant pulmonary embolus. Esophagus is decompressed. No significant lymphadenopathy in the chest. Scattered calcified lymph nodes are present. Evaluation of lungs is limited due to respiratory motion artifact. Mild patchy reticular nodular changes and slight mosaic attenuation pattern may indicate mild interstitial edema or interstitial pneumonitis. No focal consolidation. No pleural effusions. No pneumothorax. Peribronchial thickening suggesting small airways disease. Included portions of the upper abdominal organs demonstrate multiple low-attenuation lesions throughout the liver consistent with multiple up attic cysts. Degenerative changes in the spine. No destructive bone lesions. Review of the MIP images confirms the above findings. IMPRESSION: No evidence of aortic aneurysm or dissection. No evidence of significant pulmonary embolus. Interstitial changes in the lungs may represent edema or interstitial pneumonitis. No focal consolidation. Airways thickening. Benign-appearing hepatic cysts. Electronically Signed   By: William  Stevens M.D.   On: 12/31/2015 00:49      Assessment/Plan:   Active Problems:   Chest pain with moderate risk for cardiac etiology   HLD (hyperlipidemia)   Precordial pain   1. Chest pain: ? USAP; trop neg; new T abnormality in V3 this am; on iv NTG and metoprolol; with T abnl will heparinize. Chest CT neg for aortic dissection or PE.  Plan for definitive cath today.  I have reviewed the risks, indications, and alternatives to cardiac catheterization, possible angioplasty, and stenting with the patient. Risks include but are not limited to bleeding, infection, vascular injury, stroke, myocardial infection, arrhythmia,  kidney injury, radiation-related injury in the case of prolonged fluoroscopy use, emergency cardiac surgery, and death. The patient understands the risks of serious complication is 1-2 in 1000 with diagnostic cardiac cath and 1-2% or less with angioplasty/stenting.   2. Hyperlipidemia:  Pt has been on simvastatin;  LDL elevaterd at 142; will dc simvastatin and start atorvastatin 80 mg;  Check LFTs since min ALT elevation 1 year ago.   3. GERD: on pantoprazole  4. Mild obesity with BMI 32.1; will need weight loss.   Thomas A. Kelly, MD, FACC 12/31/2015, 8:05 AM 

## 2015-12-31 NOTE — Progress Notes (Signed)
  Echocardiogram 2D Echocardiogram has been performed.  Darlina Sicilian M 12/31/2015, 2:02 PM

## 2015-12-31 NOTE — Significant Event (Signed)
Paged re: ongoing CP for Ms. Heidi Payne. Her ECGs were unchanged and troponins were normal. Given severity of symptoms w/o objective evidence for ACS, will plan to r/o PE and dissection with CT angiogram.   Addendum: CT scan w/o dissection or PE

## 2016-01-01 ENCOUNTER — Encounter (HOSPITAL_COMMUNITY): Payer: Self-pay

## 2016-01-01 DIAGNOSIS — Z0389 Encounter for observation for other suspected diseases and conditions ruled out: Secondary | ICD-10-CM

## 2016-01-01 DIAGNOSIS — IMO0001 Reserved for inherently not codable concepts without codable children: Secondary | ICD-10-CM

## 2016-01-01 LAB — CBC
HCT: 38.6 % (ref 36.0–46.0)
HEMOGLOBIN: 12.7 g/dL (ref 12.0–15.0)
MCH: 27.9 pg (ref 26.0–34.0)
MCHC: 32.9 g/dL (ref 30.0–36.0)
MCV: 84.8 fL (ref 78.0–100.0)
PLATELETS: 224 10*3/uL (ref 150–400)
RBC: 4.55 MIL/uL (ref 3.87–5.11)
RDW: 13 % (ref 11.5–15.5)
WBC: 7.4 10*3/uL (ref 4.0–10.5)

## 2016-01-01 MED ORDER — ALUM & MAG HYDROXIDE-SIMETH 200-200-20 MG/5ML PO SUSP: 30.0000 mL | Freq: Four times a day (QID) | ORAL | 0 refills | Status: DC | PRN

## 2016-01-01 MED ORDER — ACETAMINOPHEN 325 MG PO TABS: 650.0000 mg | ORAL_TABLET | ORAL | Status: DC | PRN

## 2016-01-01 MED ORDER — PANTOPRAZOLE SODIUM 40 MG PO TBEC: 40.0000 mg | DELAYED_RELEASE_TABLET | Freq: Every day | ORAL | 3 refills | Status: DC

## 2016-01-01 NOTE — Discharge Instructions (Signed)
Radial Site Care °Refer to this sheet in the next few weeks. These instructions provide you with information about caring for yourself after your procedure. Your health care provider may also give you more specific instructions. Your treatment has been planned according to current medical practices, but problems sometimes occur. Call your health care provider if you have any problems or questions after your procedure. °WHAT TO EXPECT AFTER THE PROCEDURE °After your procedure, it is typical to have the following: °· Bruising at the radial site that usually fades within 1-2 weeks. °· Blood collecting in the tissue (hematoma) that may be painful to the touch. It should usually decrease in size and tenderness within 1-2 weeks. °HOME CARE INSTRUCTIONS °· Take medicines only as directed by your health care provider. °· You may shower 24-48 hours after the procedure or as directed by your health care provider. Remove the bandage (dressing) and gently wash the site with plain soap and water. Pat the area dry with a clean towel. Do not rub the site, because this may cause bleeding. °· Do not take baths, swim, or use a hot tub until your health care provider approves. °· Check your insertion site every day for redness, swelling, or drainage. °· Do not apply powder or lotion to the site. °· Do not flex or bend the affected arm for 24 hours or as directed by your health care provider. °· Do not push or pull heavy objects with the affected arm for 24 hours or as directed by your health care provider. °· Do not lift over 10 lb (4.5 kg) for 5 days after your procedure or as directed by your health care provider. °· Ask your health care provider when it is okay to: °¨ Return to work or school. °¨ Resume usual physical activities or sports. °¨ Resume sexual activity. °· Do not drive home if you are discharged the same day as the procedure. Have someone else drive you. °· You may drive 24 hours after the procedure unless otherwise  instructed by your health care provider. °· Do not operate machinery or power tools for 24 hours after the procedure. °· If your procedure was done as an outpatient procedure, which means that you went home the same day as your procedure, a responsible adult should be with you for the first 24 hours after you arrive home. °· Keep all follow-up visits as directed by your health care provider. This is important. °SEEK MEDICAL CARE IF: °· You have a fever. °· You have chills. °· You have increased bleeding from the radial site. Hold pressure on the site. °SEEK IMMEDIATE MEDICAL CARE IF: °· You have unusual pain at the radial site. °· You have redness, warmth, or swelling at the radial site. °· You have drainage (other than a small amount of blood on the dressing) from the radial site. °· The radial site is bleeding, and the bleeding does not stop after 30 minutes of holding steady pressure on the site. °· Your arm or hand becomes pale, cool, tingly, or numb. °  °This information is not intended to replace advice given to you by your health care provider. Make sure you discuss any questions you have with your health care provider. °  °Document Released: 06/03/2010 Document Revised: 05/22/2014 Document Reviewed: 11/17/2013 °Elsevier Interactive Patient Education ©2016 [employer] ° °

## 2016-01-01 NOTE — Discharge Summary (Signed)
Patient ID: Heidi Payne,  MRN: [id number], DOB/AGE: Dec 24, 1965 50 y.o.  Admit date: 12/30/2015 Discharge date: 01/01/2016  Primary Care Provider: Gar Ponto, MD Primary Cardiologist: Dr Claiborne Billings  Discharge Diagnoses Principal Problem:   Chest pain with moderate risk for cardiac etiology Active Problems:   HLD (hyperlipidemia)   Normal coronary arteries    Procedures: Echo 12/31/15                        CTA chest 12/31/15                        Cath 12/31/15   Hospital Course 50 y.o.Caucasian femalewith past medical history of HLD (admits to non compliance with Zocor), GERD, and depression who presented to [facility] on 12/30/2015 for chest pain and was transferred to [facility] for further evaluation. Troponin were negative. She did have TWI in V3 on one EKG. Echo and chest CTA unremarkable.  She had recurrent chest pain and it was decided to proceed with coronary angiogram which was done 12/31/15. This revealed normal coronaries. We feel she is stable for discharge 01/01/16. She will f/u with her PCP.    Discharge Vitals:  Blood pressure 137/82, pulse 99, temperature 98.3 F (36.8 C), temperature source Oral, resp. rate 13, height 5\' 6"  (1.676 m), weight 198 lb 13.7 oz (90.2 kg), last menstrual period 12/08/2015, SpO2 95 %.    Labs: Results for orders placed or performed during the hospital encounter of 12/30/15 (from the past 24 hour(s))  CBC     Status: None   Collection Time: 01/01/16  3:55 AM  Result Value Ref Range   WBC 7.4 4.0 - 10.5 K/uL   RBC 4.55 3.87 - 5.11 MIL/uL   Hemoglobin 12.7 12.0 - 15.0 g/dL   HCT 38.6 36.0 - 46.0 %   MCV 84.8 78.0 - 100.0 fL   MCH 27.9 26.0 - 34.0 pg   MCHC 32.9 30.0 - 36.0 g/dL   RDW 13.0 11.5 - 15.5 %   Platelets 224 150 - 400 K/uL    Disposition:  Follow-up Information    DANIEL, TERRY, MD. Schedule an appointment as soon as possible for a visit today.   Specialty:  Family Medicine Why:  Call for follow  up Contact information: [address] [place] [address] [phone]           Discharge Medications:    Medication List    TAKE these medications   acetaminophen 325 MG tablet Commonly known as:  TYLENOL Take 2 tablets (650 mg total) by mouth every 4 (four) hours as needed for headache or mild pain.   albuterol 108 (90 Base) MCG/ACT inhaler Commonly known as:  PROVENTIL HFA;VENTOLIN HFA Inhale 2 puffs into the lungs every 6 (six) hours as needed for wheezing or shortness of breath.   alum & mag hydroxide-simeth 200-200-20 MG/5ML suspension Commonly known as:  MAALOX/MYLANTA Take 30 mLs by mouth every 6 (six) hours as needed for indigestion.   betamethasone dipropionate 0.05 % cream Commonly known as:  DIPROLENE Apply 1 application topically 2 (two) times daily as needed. For finger   ibuprofen 200 MG tablet Commonly known as:  ADVIL,MOTRIN Take 200 mg by mouth as needed for moderate pain.   loratadine 10 MG tablet Commonly known as:  CLARITIN Take 10 mg by mouth daily as needed. For allergies   pantoprazole 40 MG tablet Commonly known as:  PROTONIX Take 1 tablet (40 mg total) by mouth daily.   simvastatin 20 MG tablet Commonly known as:  ZOCOR Take 1 tablet (20 mg total) by mouth daily.        Duration of Discharge Encounter: Greater than 30 minutes including physician time.  Angelena Form PA-C 01/01/2016 8:56 AM

## 2016-01-01 NOTE — Progress Notes (Signed)
    Subjective:  No chest pain  Objective:  Vital Signs in the last 24 hours: Temp:  [98.2 F (36.8 C)-98.8 F (37.1 C)] 98.3 F (36.8 C) (08/19 0721) Pulse Rate:  [0-108] 99 (08/19 0721) Resp:  [0-64] 13 (08/19 0721) BP: (92-152)/(62-101) 137/82 (08/19 0721) SpO2:  [0 %-100 %] 95 % (08/19 0721) Weight:  [198 lb 13.7 oz (90.2 kg)] 198 lb 13.7 oz (90.2 kg) (08/19 0401)  Intake/Output from previous day:  Intake/Output Summary (Last 24 hours) at 01/01/16 0841 Last data filed at 01/01/16 0835  Gross per 24 hour  Intake             1769 ml  Output              500 ml  Net             1269 ml    Physical Exam: General appearance: alert, cooperative, no distress and mildly obese Neck: no carotid bruit and no JVD Lungs: clear to auscultation bilaterally Heart: regular rate and rhythm Extremities: Rt radial site without hematoma Neurologic: Grossly normal   Rate: 100  Rhythm: normal sinus rhythm and sinus tachycardia  Lab Results:  Recent Labs  12/30/15 2123 01/01/16 0355  WBC 11.1* 7.4  HGB 13.0 12.7  PLT 280 224    Recent Labs  12/30/15 2123  NA 137  K 3.5  CL 109  CO2 22  GLUCOSE 117*  BUN 8  CREATININE 0.96    Recent Labs  12/30/15 2123 12/31/15 0301  TROPONINI <0.03 <0.03    Recent Labs  12/30/15 2123  INR 1.02    Scheduled Meds: . aspirin EC  81 mg Oral Daily  . metoprolol tartrate  25 mg Oral BID  . pantoprazole  40 mg Oral Daily  . simvastatin  20 mg Oral q1800  . sodium chloride flush  3 mL Intravenous Q12H   Continuous Infusions:  PRN Meds:.sodium chloride, acetaminophen, albuterol, alum & mag hydroxide-simeth, morphine injection, ondansetron (ZOFRAN) IV, ondansetron (ZOFRAN) IV, sodium chloride flush, zolpidem   Imaging: Imaging results have been reviewed  Cardiac Studies: echo 12/31/15 Left ventricle:  The cavity size was normal. Systolic function was normal. The estimated ejection fraction was in the range of 55% to 60%.  Wall motion was normal; there were no regional wall motion abnormalities. The transmitral flow pattern was normal. The pulmonary vein flow pattern was normal. Early diastolic septal annular tissue Doppler velocities Ea were mildly abnormal. Indeterminate ventricular filling pressure by Doppler parameters.     Assessment/Plan:  50 y.o. Caucasian female with past medical history of HLD (admits to non compliance with Zocor), GERD, and depression who presented to [facility] on 12/30/2015 for chest pain and was transferred to [facility] for further evaluation.   Principal Problem:   Chest pain with moderate risk for cardiac etiology Active Problems:   HLD (hyperlipidemia)   Normal coronary arteries   PLAN: OK for discharge- f/u with PCP  Kerin Ransom PA-C 01/01/2016, 8:41 AM [phone]  Personally seen and examined. Agree with above.  ECHO normal, CTA chest normal, Cath normal. Reassuring cardiac workup. Has sinus tachycardia at baseline. No underlying pathologic cause. Reassuring. Keep on simvastatin. LDL 142.   Candee Furbish, MD

## 2016-01-03 ENCOUNTER — Encounter (HOSPITAL_COMMUNITY): Payer: Self-pay | Admitting: Interventional Cardiology

## 2016-01-06 NOTE — Research (Signed)
CADLAD Informed Consent   Subject Name: Heidi Payne  Subject met inclusion and exclusion criteria.  The informed consent form, study requirements and expectations were reviewed with the subject and questions and concerns were addressed prior to the signing of the consent form.  The subject verbalized understanding of the trial requirements.  The subject agreed to participate in the CADLAD trial and signed the informed consent.  The informed consent was obtained prior to performance of any protocol-specific procedures for the subject.  A copy of the signed informed consent was given to the subject and a copy was placed in the subject's medical record.  Desmond Dike H 12/21/2015 08:00

## 2016-02-07 ENCOUNTER — Other Ambulatory Visit: Payer: Self-pay | Admitting: Adult Health

## 2016-09-14 ENCOUNTER — Other Ambulatory Visit (HOSPITAL_COMMUNITY)
Admission: RE | Admit: 2016-09-14 | Discharge: 2016-09-14 | Disposition: A | Discharge status: Home / Self Care | Payer: 59 | Source: Ambulatory Visit | Attending: Adult Health | Admitting: Adult Health

## 2016-09-14 ENCOUNTER — Ambulatory Visit (INDEPENDENT_AMBULATORY_CARE_PROVIDER_SITE_OTHER): Payer: 59 | Admitting: Adult Health

## 2016-09-14 ENCOUNTER — Encounter: Payer: Self-pay | Admitting: Adult Health

## 2016-09-14 VITALS — BP 130/82 | HR 82 | Ht 66.5 in | Wt 194.0 lb

## 2016-09-14 DIAGNOSIS — Z79890 Hormone replacement therapy (postmenopausal): Secondary | ICD-10-CM | POA: Insufficient documentation

## 2016-09-14 DIAGNOSIS — Z1212 Encounter for screening for malignant neoplasm of rectum: Secondary | ICD-10-CM

## 2016-09-14 DIAGNOSIS — N926 Irregular menstruation, unspecified: Secondary | ICD-10-CM

## 2016-09-14 DIAGNOSIS — Z8742 Personal history of other diseases of the female genital tract: Secondary | ICD-10-CM

## 2016-09-14 DIAGNOSIS — E7800 Pure hypercholesterolemia, unspecified: Secondary | ICD-10-CM

## 2016-09-14 DIAGNOSIS — Z01419 Encounter for gynecological examination (general) (routine) without abnormal findings: Secondary | ICD-10-CM | POA: Diagnosis not present

## 2016-09-14 DIAGNOSIS — Z1211 Encounter for screening for malignant neoplasm of colon: Secondary | ICD-10-CM | POA: Diagnosis not present

## 2016-09-14 DIAGNOSIS — R61 Generalized hyperhidrosis: Secondary | ICD-10-CM

## 2016-09-14 DIAGNOSIS — R232 Flushing: Secondary | ICD-10-CM | POA: Insufficient documentation

## 2016-09-14 LAB — HEMOCCULT GUIAC POC 1CARD (OFFICE): Fecal Occult Blood, POC: NEGATIVE

## 2016-09-14 MED ORDER — CONJ ESTROGENS-BAZEDOXIFENE 0.45-20 MG PO TABS: ORAL_TABLET | ORAL | 0 refills | Status: DC

## 2016-09-14 NOTE — Progress Notes (Signed)
Patient ID: Heidi Payne, female   DOB: 04-02-1966, 51 y.o.   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 She is complaining of hot flashes, night sweats and irregular periods(has not missed any) and being irritable.  PCP is Dr Quillian Quince in [place].    Current Medications, Allergies, Past Medical History, Past Surgical History, Family History and Social History were reviewed in [employer] record.     Review of Systems: Patient denies any headaches, hearing loss, fatigue, blurred vision, shortness of breath, chest pain, abdominal pain, problems with bowel movements(can have to go after eating, and may not be able to control it), urination, or intercourse. No joint pain or mood swings. See HPI for positives.   Physical Exam:BP 130/82 (BP Location: Right Arm, Patient Position: Sitting, Cuff Size: Normal)   Pulse 82   Ht 5' 6.5" (1.689 m)   Wt 194 lb (88 kg)   LMP 09/04/2016 (Exact Date)   BMI 30.84 kg/m  General:  Well developed, well nourished, no acute distress Skin:  Warm and dry Neck:  Midline trachea, normal thyroid, good ROM, no lymphadenopathy Lungs; Clear to auscultation bilaterally Breast:  No dominant palpable mass, retraction, or nipple discharge Cardiovascular: Regular rate and rhythm Abdomen:  Soft, non tender, no hepatosplenomegaly Pelvic:  External genitalia is normal in appearance, no lesions.+vitiligo.   The vagina is normal in appearance. Urethra has no lesions or masses. The cervix is bulbous. Pap with HVP performed. Uterus is felt to be normal size, shape, and contour.  No adnexal masses or tenderness noted.Bladder is non tender, no masses felt. Rectal: Good sphincter tone, no polyps, or hemorrhoids felt.  Hemoccult negative. Extremities/musculoskeletal:  No swelling or varicosities noted, no clubbing or  cyanosis Psych:  No mood changes, alert and cooperative,seems happy PHQ 2 score 0. Discussed trying HRT with risks and benefits and she wants to try them.   Impression: 1. Encounter for gynecological examination with Papanicolaou smear of cervix   2. History of abnormal cervical Pap smear   3. Elevated cholesterol   4. Screening for colorectal cancer   5. Hot flashes   6. Irregular periods   7. Night sweats   8. Hormone replacement therapy (HRT)       Plan: Given samples of Duavee #42 take 1 daily lot [phone] exp 9/19,can call me if wants Rx Physical in 1 year Pap in 3 if normal Labs with PCP Mammogram yearly Colonoscopy 03/2017

## 2016-09-18 LAB — CYTOLOGY - PAP
DIAGNOSIS: NEGATIVE
HPV (WINDOPATH): NOT DETECTED

## 2016-10-17 ENCOUNTER — Telehealth: Payer: Self-pay | Admitting: Adult Health

## 2016-10-17 MED ORDER — CONJ ESTROGENS-BAZEDOXIFENE 0.45-20 MG PO TABS: ORAL_TABLET | ORAL | 12 refills | Status: DC

## 2016-10-17 NOTE — Telephone Encounter (Signed)
She is doing well on [facility] and wants Rx sent to [place], will do.

## 2017-03-01 ENCOUNTER — Other Ambulatory Visit: Payer: Self-pay | Admitting: Adult Health

## 2017-07-25 ENCOUNTER — Other Ambulatory Visit: Payer: Self-pay | Admitting: Adult Health

## 2017-07-25 DIAGNOSIS — Z1231 Encounter for screening mammogram for malignant neoplasm of breast: Secondary | ICD-10-CM

## 2017-08-02 ENCOUNTER — Encounter (HOSPITAL_COMMUNITY): Payer: Self-pay

## 2017-08-02 ENCOUNTER — Ambulatory Visit (HOSPITAL_COMMUNITY)
Admission: RE | Admit: 2017-08-02 | Discharge: 2017-08-02 | Disposition: A | Discharge status: Home / Self Care | Payer: 59 | Source: Ambulatory Visit | Attending: Adult Health | Admitting: Adult Health

## 2017-08-02 ENCOUNTER — Other Ambulatory Visit: Payer: Self-pay | Admitting: Adult Health

## 2017-08-02 DIAGNOSIS — R928 Other abnormal and inconclusive findings on diagnostic imaging of breast: Secondary | ICD-10-CM

## 2017-08-02 DIAGNOSIS — Z1231 Encounter for screening mammogram for malignant neoplasm of breast: Secondary | ICD-10-CM | POA: Insufficient documentation

## 2017-08-07 ENCOUNTER — Ambulatory Visit (HOSPITAL_COMMUNITY)
Admission: RE | Admit: 2017-08-07 | Discharge: 2017-08-07 | Disposition: A | Discharge status: Home / Self Care | Payer: 59 | Source: Ambulatory Visit | Attending: Adult Health | Admitting: Adult Health

## 2017-08-07 DIAGNOSIS — R928 Other abnormal and inconclusive findings on diagnostic imaging of breast: Secondary | ICD-10-CM

## 2017-09-17 ENCOUNTER — Ambulatory Visit (INDEPENDENT_AMBULATORY_CARE_PROVIDER_SITE_OTHER): Payer: 59 | Admitting: Adult Health

## 2017-09-17 ENCOUNTER — Encounter: Payer: Self-pay | Admitting: Adult Health

## 2017-09-17 ENCOUNTER — Other Ambulatory Visit: Payer: Self-pay

## 2017-09-17 VITALS — BP 118/76 | HR 90 | Resp 18 | Ht 66.0 in | Wt 199.0 lb

## 2017-09-17 DIAGNOSIS — Z1212 Encounter for screening for malignant neoplasm of rectum: Secondary | ICD-10-CM | POA: Diagnosis not present

## 2017-09-17 DIAGNOSIS — Z01419 Encounter for gynecological examination (general) (routine) without abnormal findings: Secondary | ICD-10-CM

## 2017-09-17 DIAGNOSIS — Z01411 Encounter for gynecological examination (general) (routine) with abnormal findings: Secondary | ICD-10-CM

## 2017-09-17 DIAGNOSIS — Z79890 Hormone replacement therapy (postmenopausal): Secondary | ICD-10-CM | POA: Diagnosis not present

## 2017-09-17 DIAGNOSIS — Z1211 Encounter for screening for malignant neoplasm of colon: Secondary | ICD-10-CM

## 2017-09-17 LAB — HEMOCCULT GUIAC POC 1CARD (OFFICE): FECAL OCCULT BLD: NEGATIVE

## 2017-09-17 NOTE — Progress Notes (Signed)
Patient ID: Heidi Payne, female   DOB: 02/10/1966, 52 y.o.   MRN: [id number] History of Present Illness: Heidi Payne is a 52 year old white female,married in for a well woman gyn exam,she had a normal pap with negative HPV 09/14/16. PCP is Dr Heidi Payne.    Current Medications, Allergies, Past Medical History, Past Surgical History, Family History and Social History were reviewed in [employer] record.     Review of Systems: Patient denies any headaches, hearing loss, fatigue, blurred vision, shortness of breath, chest pain, abdominal pain, problems with bowel movements, urination, or intercourse. No joint pain or mood swings.Has irregular periods still.    Physical Exam:BP 118/76 (BP Location: Right Arm, Patient Position: Sitting, Cuff Size: Normal)   Pulse 90   Resp 18   Ht 5\' 6"  (1.676 m)   Wt 199 lb (90.3 kg)   LMP 09/07/2017   BMI 32.12 kg/m  General:  Well developed, well nourished, no acute distress Skin:  Warm and dry Neck:  Midline trachea, normal thyroid, good ROM, no lymphadenopathy Lungs; Clear to auscultation bilaterally Breast:  No dominant palpable mass, retraction, or nipple discharge Cardiovascular: Regular rate and rhythm Abdomen:  Soft, non tender, no hepatosplenomegaly Pelvic:  External genitalia is normal in appearance, no lesions. Has areas of vitiligo.  The vagina is normal in appearance. Urethra has no lesions or masses. The cervix is bulbous.  Uterus is felt to be normal size, shape, and contour.  No adnexal masses or tenderness noted.Bladder is non tender, no masses felt. Rectal: Good sphincter tone, no polyps, or hemorrhoids felt.  Hemoccult negative. Extremities/musculoskeletal:  No swelling or varicosities noted, no clubbing or cyanosis Psych:  No mood changes, alert and cooperative,seems happy PHQ 2 score 0.  Impression: 1. Encounter for well woman exam with routine gynecological exam   2. Screening for colorectal cancer   3. Hormone  replacement therapy (HRT)       Plan: Physical in 1 year Pap in 2021 Mammogram yearly Labs with PCP Colonoscopy per GI Continue Duavee 1 daily, samples given

## 2018-10-03 ENCOUNTER — Other Ambulatory Visit (HOSPITAL_COMMUNITY): Payer: Self-pay | Admitting: Adult Health

## 2018-10-03 DIAGNOSIS — Z1231 Encounter for screening mammogram for malignant neoplasm of breast: Secondary | ICD-10-CM

## 2018-10-10 ENCOUNTER — Ambulatory Visit (HOSPITAL_COMMUNITY)
Admission: RE | Admit: 2018-10-10 | Discharge: 2018-10-10 | Disposition: A | Discharge status: Home / Self Care | Payer: 59 | Source: Ambulatory Visit | Attending: Adult Health | Admitting: Adult Health

## 2018-10-10 ENCOUNTER — Other Ambulatory Visit: Payer: Self-pay

## 2018-10-10 DIAGNOSIS — Z1231 Encounter for screening mammogram for malignant neoplasm of breast: Secondary | ICD-10-CM | POA: Insufficient documentation

## 2019-01-09 ENCOUNTER — Ambulatory Visit (INDEPENDENT_AMBULATORY_CARE_PROVIDER_SITE_OTHER): Payer: 59 | Admitting: Obstetrics & Gynecology

## 2019-01-09 ENCOUNTER — Other Ambulatory Visit (HOSPITAL_COMMUNITY)
Admission: RE | Admit: 2019-01-09 | Discharge: 2019-01-09 | Disposition: A | Discharge status: Home / Self Care | Payer: 59 | Source: Ambulatory Visit | Attending: Obstetrics & Gynecology | Admitting: Obstetrics & Gynecology

## 2019-01-09 ENCOUNTER — Other Ambulatory Visit: Payer: Self-pay

## 2019-01-09 ENCOUNTER — Encounter: Payer: Self-pay | Admitting: Obstetrics & Gynecology

## 2019-01-09 VITALS — BP 146/90 | HR 86 | Ht 66.0 in | Wt 205.0 lb

## 2019-01-09 DIAGNOSIS — Z124 Encounter for screening for malignant neoplasm of cervix: Secondary | ICD-10-CM | POA: Diagnosis not present

## 2019-01-09 DIAGNOSIS — N838 Other noninflammatory disorders of ovary, fallopian tube and broad ligament: Secondary | ICD-10-CM

## 2019-01-09 NOTE — Progress Notes (Signed)
Chief Complaint  Patient presents with  . Ovarian Cyst      53 y.o. [id number] No LMP recorded. (Menstrual status: Irregular Periods). The current method of family planning is tubal ligation.  Outpatient Encounter Medications as of 01/09/2019  Medication Sig  . acetaminophen (TYLENOL) 325 MG tablet Take 2 tablets (650 mg total) by mouth every 4 (four) hours as needed for headache or mild pain.  Marland Kitchen albuterol (PROVENTIL HFA;VENTOLIN HFA) 108 (90 Base) MCG/ACT inhaler Inhale 2 puffs into the lungs every 6 (six) hours as needed for wheezing or shortness of breath.  Marland Kitchen atorvastatin (LIPITOR) 20 MG tablet Take 20 mg by mouth daily.  Marland Kitchen buPROPion (WELLBUTRIN SR) 150 MG 12 hr tablet Take 150 mg by mouth daily.  . DULoxetine (CYMBALTA) 60 MG capsule Take 60 mg by mouth daily.   Marland Kitchen omeprazole (PRILOSEC) 40 MG capsule Take 40 mg by mouth daily.  . tamsulosin (FLOMAX) 0.4 MG CAPS capsule Take 0.4 mg by mouth.  . topiramate (TOPAMAX) 50 MG tablet TAKE ONE TABLET BY MOUTH TWICE DAILY TO PREVENT MIGRAINE HEADACHES.  Marland Kitchen alum & mag hydroxide-simeth (MAALOX/MYLANTA) 200-200-20 MG/5ML suspension Take 30 mLs by mouth every 6 (six) hours as needed for indigestion. (Patient not taking: Reported on 01/09/2019)  . Conj Estrogens-Bazedoxifene (DUAVEE) 0.45-20 MG TABS Take 1 po daily (Patient not taking: Reported on 01/09/2019)  . [DISCONTINUED] ibuprofen (ADVIL,MOTRIN) 200 MG tablet Take 200 mg by mouth as needed for moderate pain.   . [DISCONTINUED] loratadine (CLARITIN) 10 MG tablet Take 10 mg by mouth daily as needed. For allergies  . [DISCONTINUED] simvastatin (ZOCOR) 20 MG tablet TAKE ONE TABLET BY MOUTH DAILY   No facility-administered encounter medications on file as of 01/09/2019.     Subjective Pt is referred from [facility] due to an incidental finding on CT scan of a complex left ovarian mass, cystic and solid components,  originally got a CT scan for evaluation ureteral and renal calculi  Past Medical History:  Diagnosis Date  . Anginal pain ([place])   . Depression    hitory of  . GERD (gastroesophageal reflux disease)   . Headache   . Hyperlipidemia   . Kidney stones   . Mental disorder    depression.  . Vitiligo     Past Surgical History:  Procedure Laterality Date  . APPENDECTOMY     1975  . CARDIAC CATHETERIZATION N/A 12/31/2015   Procedure: Left Heart Cath and Coronary Angiography;  Surgeon: Jettie Booze, MD;  Location: [facility];  Service: Cardiovascular;  Laterality: N/A;  . CESAREAN SECTION W/BTL  2005  . CHOLECYSTECTOMY  11/27/2011   Procedure: LAPAROSCOPIC CHOLECYSTECTOMY;  Surgeon: Jamesetta So, MD;  Location: [facility];  Service: General;  Laterality: N/A;  . COLONOSCOPY  03/19/2012   Procedure: COLONOSCOPY;  Surgeon: Jamesetta So, MD;  Location: [facility];  Service: Gastroenterology;  Laterality: N/A;  . DILATION AND CURETTAGE OF UTERUS    . TUBAL LIGATION      OB History    Gravida  5   Para  2   Term      Preterm      AB  3   Living  2     SAB  3   TAB      Ectopic      Multiple      Live Births  2           No Known  Allergies  Social History   Socioeconomic History  . Marital status: Married    Spouse name: Not on file  . Number of children: Not on file  . Years of education: Not on file  . Highest education level: Not on file  Occupational History  . Not on file  Social Needs  . Financial resource strain: Not on file  . Food insecurity    Worry: Not on file    Inability: Not on file  . Transportation needs    Medical: Not on file    Non-medical: Not on file  Tobacco Use  . Smoking status: Never Smoker  . Smokeless tobacco: Never Used  Substance and Sexual Activity  . Alcohol use: Yes    Comment: occassionally  . Drug use: No  . Sexual activity: Yes    Birth control/protection: Surgical    Comment: tubal  Lifestyle  . Physical activity    Days per week: Not on file    Minutes per  session: Not on file  . Stress: Not on file  Relationships  . Social Herbalist on phone: Not on file    Gets together: Not on file    Attends religious service: Not on file    Active member of club or organization: Not on file    Attends meetings of clubs or organizations: Not on file    Relationship status: Not on file  Other Topics Concern  . Not on file  Social History Narrative  . Not on file    Family History  Problem Relation Age of Onset  . Cancer Mother        bone brain cervical  . Cancer Father        colon  . Diabetes Father   . Hypertension Father   . Cancer Maternal Aunt        breast  . Cancer Paternal Aunt        esophageal, lung  . Cancer Maternal Aunt        breast  . Cancer Maternal Aunt        breast    Medications:       Current Outpatient Medications:  .  acetaminophen (TYLENOL) 325 MG tablet, Take 2 tablets (650 mg total) by mouth every 4 (four) hours as needed for headache or mild pain., Disp: , Rfl:  .  albuterol (PROVENTIL HFA;VENTOLIN HFA) 108 (90 Base) MCG/ACT inhaler, Inhale 2 puffs into the lungs every 6 (six) hours as needed for wheezing or shortness of breath., Disp: , Rfl:  .  atorvastatin (LIPITOR) 20 MG tablet, Take 20 mg by mouth daily., Disp: , Rfl:  .  buPROPion (WELLBUTRIN SR) 150 MG 12 hr tablet, Take 150 mg by mouth daily., Disp: , Rfl:  .  DULoxetine (CYMBALTA) 60 MG capsule, Take 60 mg by mouth daily. , Disp: , Rfl:  .  omeprazole (PRILOSEC) 40 MG capsule, Take 40 mg by mouth daily., Disp: , Rfl:  .  tamsulosin (FLOMAX) 0.4 MG CAPS capsule, Take 0.4 mg by mouth., Disp: , Rfl:  .  topiramate (TOPAMAX) 50 MG tablet, TAKE ONE TABLET BY MOUTH TWICE DAILY TO PREVENT MIGRAINE HEADACHES., Disp: , Rfl: 1 .  alum & mag hydroxide-simeth (MAALOX/MYLANTA) [id number] MG/5ML suspension, Take 30 mLs by mouth every 6 (six) hours as needed for indigestion. (Patient not taking: Reported on 01/09/2019), Disp: 355 mL, Rfl: 0 .  Conj  Estrogens-Bazedoxifene (DUAVEE) 0.45-20 MG TABS, Take 1 po daily (Patient  not taking: Reported on 01/09/2019), Disp: 30 tablet, Rfl: 12  Objective Blood pressure (!) 146/90, pulse 86, height 5\' 6"  (1.676 m), weight 205 lb (93 kg).  General WDWN female NAD Vulva:  normal appearing vulva with no masses, tenderness or lesions Vagina:  normal mucosa, no discharge Cervix:  Normal no lesions bleeds easily Pap done Uterus:  normal size, contour, position, consistency, mobility, non-tender Adnexa: ovaries:present,  normal adnexa in size, nontender and left side feels full   Pertinent ROS No burning with urination, frequency or urgency No nausea, vomiting or diarrhea Nor fever chills or other constitutional symptoms   Labs or studies No new    Impression Diagnoses this Encounter::   ICD-10-CM   1. Ovarian mass, left, complex  N83.8 CA 125    Established relevant diagnosis(es):   Plan/Recommendations: No orders of the defined types were placed in this encounter.   Labs or Scans Ordered: Orders Placed This Encounter  Procedures  . CA 125    Management:: >Ca 125 for pre operative evaluation >if CA 125 is normal then will proceed with laparoscopic BSO, menses are sporadic over the past year or so  Follow up No follow-ups on file.      All questions were answered.

## 2019-01-10 LAB — CA 125: Cancer Antigen (CA) 125: 25.7 U/mL (ref 0.0–38.1)

## 2019-01-14 ENCOUNTER — Ambulatory Visit (INDEPENDENT_AMBULATORY_CARE_PROVIDER_SITE_OTHER): Payer: 59 | Admitting: Obstetrics & Gynecology

## 2019-01-14 ENCOUNTER — Other Ambulatory Visit: Payer: Self-pay | Admitting: Obstetrics & Gynecology

## 2019-01-14 ENCOUNTER — Other Ambulatory Visit: Payer: Self-pay

## 2019-01-14 VITALS — BP 170/105 | HR 69 | Ht 66.0 in | Wt 205.5 lb

## 2019-01-14 DIAGNOSIS — R87619 Unspecified abnormal cytological findings in specimens from cervix uteri: Secondary | ICD-10-CM | POA: Diagnosis not present

## 2019-01-14 DIAGNOSIS — Z8742 Personal history of other diseases of the female genital tract: Secondary | ICD-10-CM

## 2019-01-14 DIAGNOSIS — Z3202 Encounter for pregnancy test, result negative: Secondary | ICD-10-CM | POA: Diagnosis not present

## 2019-01-14 HISTORY — PX: ENDOMETRIAL BIOPSY: SHX622

## 2019-01-14 LAB — CYTOLOGY - PAP: HPV: NOT DETECTED

## 2019-01-14 LAB — POCT URINE PREGNANCY: Preg Test, Ur: NEGATIVE

## 2019-01-14 NOTE — Patient Instructions (Addendum)
Your procedure is scheduled on: 01/22/2019  Report to [facility] at    7:30 AM.  Call this number if you have problems the morning of surgery: [phone]   Remember:   Do not Eat or Drink after midnight   :  Take these medicines the morning of surgery with A SIP OF WATER: Cymbalta, omeprazole, and Bupropion.  Use albuterol if needed   Do not wear jewelry, make-up or nail polish.  Do not wear lotions, powders, or perfumes. You may wear deodorant.  Do not shave 48 hours prior to surgery. Men may shave face and neck.  Do not bring valuables to the hospital.  Contacts, dentures or bridgework may not be worn into surgery.  Leave suitcase in the car. After surgery it may be brought to your room.  For patients admitted to the hospital, checkout time is 11:00 AM the day of discharge.   Patients discharged the day of surgery will not be allowed to drive home.    Special Instructions: Shower using CHG night before surgery and shower the day of surgery use CHG.  Use special wash - you have one bottle of CHG for all showers.  You should use approximately 1/2 of the bottle for each shower.  Bilateral Salpingo-Oophorectomy  Bilateral salpingo-oophorectomy is the surgical removal of both fallopian tubes and both ovaries. The ovaries are reproductive organs that produce eggs in women. The fallopian tubes allow eggs to move from the ovaries to the uterus. You may need this procedure if you:  Have had your uterus removed. This procedure is usually done after the uterus is removed.  Have cancer of the fallopian tubes or ovaries.  Have a high risk of cancer of the fallopian tubes or ovaries. There are three different techniques that can be used for this procedure:  Open. One large incision will be made in your abdomen.  Laparoscopic. A thin, lighted tube with a small camera on the end (laparoscope) will be used to help perform the procedure. The laparoscope will allow your surgeon to make several  small incisions in the abdomen instead of one large incision.  Robot-assisted. A computer will be used to control surgical instruments that are attached to robotic arms. A laparoscope may also be used with this technique. As a result of this procedure, you will become sterile (unable to become pregnant), and you will go into menopause (no longer able to have menstrual periods). You may develop symptoms of menopause such as hot flashes, night sweats, and mood changes. Your sex drive may also be affected. Tell a health care provider about:  Any allergies you have.  All medicines you are taking, including vitamins, herbs, eye drops, creams, and over-the-counter medicines.  Any problems you or family members have had with anesthetic medicines.  Any blood disorders you have.  Any surgeries you have had.  Any medical conditions you have.  Whether you are pregnant or may be pregnant. What are the risks? Generally, this is a safe procedure. However, problems may occur, including:  Infection.  Bleeding.  Allergic reactions to medicines.  Damage to other structures or organs.  Blood clots in the legs or lungs. What happens before the procedure? Staying hydrated Follow instructions from your health care provider about hydration, which may include:  Up to 2 hours before the procedure - you may continue to drink clear liquids, such as water, clear fruit juice, black coffee, and plain tea.  Eating and drinking restrictions Follow instructions from your health  care provider about eating and drinking, which may include:  8 hours before the procedure - stop eating heavy meals or foods such as meat, fried foods, or fatty foods.  6 hours before the procedure - stop eating light meals or foods, such as toast or cereal.  6 hours before the procedure - stop drinking milk or drinks that contain milk.  2 hours before the procedure - stop drinking clear liquids. Medicines  Ask your health  care provider about: ? Changing or stopping your regular medicines. This is especially important if you are taking diabetes medicines or blood thinners. ? Taking medicines such as aspirin and ibuprofen. These medicines can thin your blood. Do not take these medicines before your procedure if your health care provider instructs you not to.  You may be given antibiotic medicine to help prevent infection. General instructions  Do not smoke for at least 2 weeks before your procedure or as told by your health care provider.  You may have an exam or testing.  You may have a blood or urine sample taken.  Ask your health care provider how your surgical site will be marked or identified.  Plan to have someone take you home from the hospital.  If you will be going home right after the procedure, plan to have someone with you for 24 hours. What happens during the procedure?  To reduce your risk of infection: ? Your health care team will wash or sanitize their hands. ? Your skin will be washed with soap. ? Hair may be removed from the surgical area.  An IV tube will be inserted into one of your veins.  You will be given one or more of the following: ? A medicine to help you relax (sedative). ? A medicine to make you fall asleep (general anesthetic).  A thin tube (catheter) will be inserted through your urethra and into your bladder. The catheter drains urine during your procedure.  Depending on the type of surgery you are having, your surgeon will do one of the following: ? Make one incision in your abdomen (open surgery). ? Make two small incisions in your abdomen (laparoscopic surgery). The laparoscope will be passed through one incision, and surgical instruments will be passed through the other. ? Make several small incisions in your abdomen (robot-assisted surgery). A laparoscope and other surgical instruments may be passed through the incisions.  Your fallopian tubes and ovaries will  be cut away from the uterus and removed.  Your blood vessels will be clamped and tied to prevent too much bleeding.  The incision(s) in your abdomen will be closed with stitches (sutures) or staples.  A bandage (dressing) may be placed over your incision(s). The procedure may vary among health care providers and hospitals. What happens after the procedure?  Your blood pressure, heart rate, breathing rate, and blood oxygen level will be monitored until the medicines you were given have worn off.  You may continue to receive fluids and medicines through an IV tube.  You may continue to have a catheter draining your urine.  You may have to wear compression stockings. These stockings help to prevent blood clots and reduce swelling in your legs.  You will be given pain medicine as needed.  Do not drive for 24 hours if you received a sedative. Summary  Bilateral salpingo-oophorectomy is a procedure to remove both fallopian tubes and both ovaries.  There are three different techniques that can be used for this procedure, including open, laparoscopic,  and robotic. Talk with your health care provider about how your procedure will be done.  As a result of this procedure, you will become sterile and you will go into menopause.  Plan to have someone take you home from the hospital. This information is not intended to replace advice given to you by your health care provider. Make sure you discuss any questions you have with your health care provider. Document Released: 05/01/2005 Document Revised: 07/05/2018 Document Reviewed: 06/05/2016 Elsevier Patient Education  2020 [place].  Bilateral Salpingo-Oophorectomy, Care After This sheet gives you information about how to care for yourself after your procedure. Your health care provider may also give you more specific instructions. If you have problems or questions, contact your health care provider. What can I expect after the procedure?  After the procedure, it is common to have:  Abdominal pain.  Some occasional vaginal bleeding (spotting).  Tiredness.  Symptoms of menopause, such as hot flashes, night sweats, or mood swings. Follow these instructions at home: Incision care   Keep your incision area and your bandage (dressing) clean and dry.  Follow instructions from your health care provider about how to take care of your incision. Make sure you: ? Wash your hands with soap and water before you change your dressing. If soap and water are not available, use hand sanitizer. ? Change your dressing as told by your health care provider. ? Leave stitches (sutures), staples, skin glue, or adhesive strips in place. These skin closures may need to stay in place for 2 weeks or longer. If adhesive strip edges start to loosen and curl up, you may trim the loose edges. Do not remove adhesive strips completely unless your health care provider tells you to do that.  Check your incision area every day for signs of infection. Check for: ? Redness, swelling, or pain. ? Fluid or blood. ? Warmth. ? Pus or a bad smell. Activity   Do not drive or use heavy machinery while taking prescription pain medicine.  Do not drive for 24 hours if you received a medicine to help you relax (sedative) during your procedure.  Take frequent, short walks throughout the day. Rest when you get tired. Ask your health care provider what activities are safe for you.  Avoid activity that requires great effort. Also, avoid heavy lifting. Do not lift anything that is heavier than 10 lbs. (4.5 kg), or the limit that your health care provider tells you, until he or she says that it is safe to do so.  Do not douche, use tampons, or have sex until your health care provider approves. General instructions   To prevent or treat constipation while you are taking prescription pain medicine, your health care provider may recommend that you: ? Drink enough fluid  to keep your urine clear or pale yellow. ? Take over-the-counter or prescription medicines. ? Eat foods that are high in fiber, such as fresh fruits and vegetables, whole grains, and beans. ? Limit foods that are high in fat and processed sugars, such as fried and sweet foods.  Take over-the-counter and prescription medicines only as told by your health care provider.  Do not take baths, swim, or use a hot tub until your health care provider approves. Ask your health care provider if you can take showers. You may only be allowed to take sponge baths for bathing.  Wear compression stockings as told by your health care provider. These stockings help to prevent blood clots and reduce swelling  in your legs.  Keep all follow-up visits as told by your health care provider. This is important. Contact a health care provider if:  You have pain when you urinate.  You have pus or a bad smelling discharge coming from your vagina.  You have redness, swelling, or pain around your incision.  You have fluid or blood coming from your incision.  Your incision feels warm to the touch.  You have pus or a bad smell coming from your incision.  You have a fever.  Your incision starts to break open.  You have pain in the abdomen, and it gets worse or does not get better when you take medicine.  You develop a rash.  You develop nausea and vomiting.  You feel lightheaded. Get help right away if:  You develop pain in your chest or leg.  You become short of breath.  You faint.  You have increased bleeding from your vagina. Summary  After the procedure, it is common to have pain, bleeding in the vagina, and symptoms of menopause.  Follow instructions from your health care provider about how to take care of your incision.  Follow instructions from your health care provider about activities and restrictions.  Check your incision every day for signs of infection and report any symptoms to your  health care provider. This information is not intended to replace advice given to you by your health care provider. Make sure you discuss any questions you have with your health care provider. Document Released: 05/01/2005 Document Revised: 07/05/2018 Document Reviewed: 06/05/2016 Elsevier Patient Education  2020 [place] Anesthesia, Adult, Care After This sheet gives you information about how to care for yourself after your procedure. Your health care provider may also give you more specific instructions. If you have problems or questions, contact your health care provider. What can I expect after the procedure? After the procedure, the following side effects are common:  Pain or discomfort at the IV site.  Nausea.  Vomiting.  Sore throat.  Trouble concentrating.  Feeling cold or chills.  Weak or tired.  Sleepiness and fatigue.  Soreness and body aches. These side effects can affect parts of the body that were not involved in surgery. Follow these instructions at home:  For at least 24 hours after the procedure:  Have a responsible adult stay with you. It is important to have someone help care for you until you are awake and alert.  Rest as needed.  Do not: ? Participate in activities in which you could fall or become injured. ? Drive. ? Use heavy machinery. ? Drink alcohol. ? Take sleeping pills or medicines that cause drowsiness. ? Make important decisions or sign legal documents. ? Take care of children on your own. Eating and drinking  Follow any instructions from your health care provider about eating or drinking restrictions.  When you feel hungry, start by eating small amounts of foods that are soft and easy to digest (bland), such as toast. Gradually return to your regular diet.  Drink enough fluid to keep your urine pale yellow.  If you vomit, rehydrate by drinking water, juice, or clear broth. General instructions  If you have sleep  apnea, surgery and certain medicines can increase your risk for breathing problems. Follow instructions from your health care provider about wearing your sleep device: ? Anytime you are sleeping, including during daytime naps. ? While taking prescription pain medicines, sleeping medicines, or medicines that make you drowsy.  Return to  your normal activities as told by your health care provider. Ask your health care provider what activities are safe for you.  Take over-the-counter and prescription medicines only as told by your health care provider.  If you smoke, do not smoke without supervision.  Keep all follow-up visits as told by your health care provider. This is important. Contact a health care provider if:  You have nausea or vomiting that does not get better with medicine.  You cannot eat or drink without vomiting.  You have pain that does not get better with medicine.  You are unable to pass urine.  You develop a skin rash.  You have a fever.  You have redness around your IV site that gets worse. Get help right away if:  You have difficulty breathing.  You have chest pain.  You have blood in your urine or stool, or you vomit blood. Summary  After the procedure, it is common to have a sore throat or nausea. It is also common to feel tired.  Have a responsible adult stay with you for the first 24 hours after general anesthesia. It is important to have someone help care for you until you are awake and alert.  When you feel hungry, start by eating small amounts of foods that are soft and easy to digest (bland), such as toast. Gradually return to your regular diet.  Drink enough fluid to keep your urine pale yellow.  Return to your normal activities as told by your health care provider. Ask your health care provider what activities are safe for you. This information is not intended to replace advice given to you by your health care provider. Make sure you discuss any  questions you have with your health care provider. Document Released: 08/07/2000 Document Revised: 05/04/2017 Document Reviewed: 12/15/2016 Elsevier Patient Education  2020 [employer].

## 2019-01-17 ENCOUNTER — Other Ambulatory Visit: Payer: Self-pay | Admitting: Obstetrics & Gynecology

## 2019-01-21 ENCOUNTER — Other Ambulatory Visit: Payer: Self-pay

## 2019-01-21 ENCOUNTER — Other Ambulatory Visit (HOSPITAL_COMMUNITY)
Admission: RE | Admit: 2019-01-21 | Discharge: 2019-01-21 | Disposition: A | Discharge status: Home / Self Care | Payer: No Typology Code available for payment source | Source: Ambulatory Visit | Attending: Obstetrics & Gynecology | Admitting: Obstetrics & Gynecology

## 2019-01-21 ENCOUNTER — Encounter (HOSPITAL_COMMUNITY)
Admission: RE | Admit: 2019-01-21 | Discharge: 2019-01-21 | Disposition: A | Discharge status: Home / Self Care | Payer: No Typology Code available for payment source | Source: Ambulatory Visit | Attending: Obstetrics & Gynecology | Admitting: Obstetrics & Gynecology

## 2019-01-21 DIAGNOSIS — D271 Benign neoplasm of left ovary: Secondary | ICD-10-CM | POA: Diagnosis not present

## 2019-01-21 DIAGNOSIS — K219 Gastro-esophageal reflux disease without esophagitis: Secondary | ICD-10-CM | POA: Diagnosis not present

## 2019-01-21 DIAGNOSIS — N839 Noninflammatory disorder of ovary, fallopian tube and broad ligament, unspecified: Secondary | ICD-10-CM | POA: Diagnosis present

## 2019-01-21 LAB — COMPREHENSIVE METABOLIC PANEL
ALT: 24 U/L (ref 0–44)
AST: 20 U/L (ref 15–41)
Albumin: 4.2 g/dL (ref 3.5–5.0)
Alkaline Phosphatase: 84 U/L (ref 38–126)
Anion gap: 10 (ref 5–15)
BUN: 13 mg/dL (ref 6–20)
CO2: 22 mmol/L (ref 22–32)
Calcium: 8.5 mg/dL — ABNORMAL LOW (ref 8.9–10.3)
Chloride: 107 mmol/L (ref 98–111)
Creatinine, Ser: 0.73 mg/dL (ref 0.44–1.00)
GFR calc Af Amer: >60 mL/min (ref >60)
GFR calc non Af Amer: >60 mL/min (ref >60)
Glucose, Bld: 106 mg/dL — ABNORMAL HIGH (ref 70–99)
Potassium: 3.4 mmol/L — ABNORMAL LOW (ref 3.5–5.1)
Sodium: 139 mmol/L (ref 135–145)
Total Bilirubin: 0.7 mg/dL (ref 0.3–1.2)
Total Protein: 7.3 g/dL (ref 6.5–8.1)

## 2019-01-21 LAB — URINALYSIS, ROUTINE W REFLEX MICROSCOPIC
Bilirubin Urine: NEGATIVE
Glucose, UA: NEGATIVE mg/dL
Hgb urine dipstick: NEGATIVE
Ketones, ur: 20 mg/dL — AB
Leukocytes,Ua: NEGATIVE
Nitrite: NEGATIVE
Protein, ur: NEGATIVE mg/dL
Specific Gravity, Urine: 1.018 (ref 1.005–1.030)
pH: 7 (ref 5.0–8.0)

## 2019-01-21 LAB — CBC
HCT: 41 % (ref 36.0–46.0)
Hemoglobin: 12.4 g/dL (ref 12.0–15.0)
MCH: 24.6 pg — ABNORMAL LOW (ref 26.0–34.0)
MCHC: 30.2 g/dL (ref 30.0–36.0)
MCV: 81.2 fL (ref 80.0–100.0)
Platelets: 312 10*3/uL (ref 150–400)
RBC: 5.05 MIL/uL (ref 3.87–5.11)
RDW: 15.6 % — ABNORMAL HIGH (ref 11.5–15.5)
WBC: 5.5 10*3/uL (ref 4.0–10.5)
nRBC: 0 % (ref 0.0–0.2)

## 2019-01-21 LAB — TYPE AND SCREEN
ABO/RH(D): AB NEG
Antibody Screen: NEGATIVE

## 2019-01-21 LAB — RAPID HIV SCREEN (HIV 1/2 AB+AG)
HIV 1/2 Antibodies: NONREACTIVE
HIV-1 P24 Antigen - HIV24: NONREACTIVE

## 2019-01-21 LAB — HCG, QUANTITATIVE, PREGNANCY: hCG, Beta Chain, Quant, S: <1 m[IU]/mL (ref <5)

## 2019-01-22 ENCOUNTER — Ambulatory Visit (HOSPITAL_COMMUNITY)
Admission: RE | Admit: 2019-01-22 | Discharge: 2019-01-22 | Disposition: A | Discharge status: Home / Self Care | Payer: No Typology Code available for payment source | Source: Ambulatory Visit | Attending: Obstetrics & Gynecology | Admitting: Obstetrics & Gynecology

## 2019-01-22 ENCOUNTER — Encounter (HOSPITAL_COMMUNITY)
Admission: RE | Disposition: A | Discharge status: Home / Self Care | Payer: Self-pay | Source: Ambulatory Visit | Attending: Obstetrics & Gynecology

## 2019-01-22 ENCOUNTER — Ambulatory Visit (HOSPITAL_COMMUNITY): Payer: No Typology Code available for payment source | Admitting: Anesthesiology

## 2019-01-22 DIAGNOSIS — D271 Benign neoplasm of left ovary: Secondary | ICD-10-CM | POA: Diagnosis not present

## 2019-01-22 DIAGNOSIS — K219 Gastro-esophageal reflux disease without esophagitis: Secondary | ICD-10-CM | POA: Insufficient documentation

## 2019-01-22 HISTORY — PX: LAPAROSCOPIC SALPINGO OOPHERECTOMY: SHX5927

## 2019-01-22 LAB — SARS CORONAVIRUS 2 (TAT 6-24 HRS): SARS Coronavirus 2: NEGATIVE

## 2019-01-22 SURGERY — SALPINGO-OOPHORECTOMY, LAPAROSCOPIC
Anesthesia: General | Site: Abdomen | Laterality: Bilateral

## 2019-01-22 MED ORDER — OXYCODONE-ACETAMINOPHEN 5-325 MG PO TABS: 1.0000 | ORAL_TABLET | ORAL | 0 refills | Status: DC | PRN

## 2019-01-22 MED ORDER — ONDANSETRON HCL 4 MG/2ML IJ SOLN
INTRAMUSCULAR | Status: AC
  Filled 2019-01-22: qty 2

## 2019-01-22 MED ORDER — KETOROLAC TROMETHAMINE 10 MG PO TABS: 10.0000 mg | ORAL_TABLET | Freq: Three times a day (TID) | ORAL | 0 refills | Status: DC | PRN

## 2019-01-22 MED ORDER — SUCCINYLCHOLINE 20MG/ML (10ML) SYRINGE FOR MEDFUSION PUMP - OPTIME
INTRAMUSCULAR | Status: DC | PRN
  Administered 2019-01-22: 120 mg via INTRAVENOUS

## 2019-01-22 MED ORDER — LACTATED RINGERS IV SOLN
INTRAVENOUS | Status: DC
  Administered 2019-01-22: 08:00:00 via INTRAVENOUS

## 2019-01-22 MED ORDER — BUPIVACAINE LIPOSOME 1.3 % IJ SUSP
20.0000 mL | Freq: Once | INTRAMUSCULAR | Status: DC
  Filled 2019-01-22: qty 20

## 2019-01-22 MED ORDER — LIDOCAINE HCL (CARDIAC) PF 50 MG/5ML IV SOSY
PREFILLED_SYRINGE | INTRAVENOUS | Status: DC | PRN
  Administered 2019-01-22: 60 mg via INTRAVENOUS

## 2019-01-22 MED ORDER — PROPOFOL 10 MG/ML IV BOLUS
INTRAVENOUS | Status: DC | PRN
  Administered 2019-01-22: 170 mg via INTRAVENOUS

## 2019-01-22 MED ORDER — ONDANSETRON HCL 4 MG/2ML IJ SOLN
INTRAMUSCULAR | Status: DC | PRN
  Administered 2019-01-22: 4 mg via INTRAVENOUS

## 2019-01-22 MED ORDER — ONDANSETRON 8 MG PO TBDP: 8.0000 mg | ORAL_TABLET | Freq: Three times a day (TID) | ORAL | 0 refills | Status: DC | PRN

## 2019-01-22 MED ORDER — HYDROMORPHONE HCL 1 MG/ML IJ SOLN: 0.2500 mg | INTRAMUSCULAR | Status: DC | PRN

## 2019-01-22 MED ORDER — MIDAZOLAM HCL 5 MG/5ML IJ SOLN
INTRAMUSCULAR | Status: DC | PRN
  Administered 2019-01-22: 2 mg via INTRAVENOUS

## 2019-01-22 MED ORDER — LIDOCAINE 2% (20 MG/ML) 5 ML SYRINGE
INTRAMUSCULAR | Status: AC
  Filled 2019-01-22: qty 5

## 2019-01-22 MED ORDER — KETOROLAC TROMETHAMINE 30 MG/ML IJ SOLN
30.0000 mg | Freq: Once | INTRAMUSCULAR | Status: AC
  Administered 2019-01-22: 30 mg via INTRAVENOUS
  Filled 2019-01-22: qty 1

## 2019-01-22 MED ORDER — MIDAZOLAM HCL 2 MG/2ML IJ SOLN: 0.5000 mg | Freq: Once | INTRAMUSCULAR | Status: DC | PRN

## 2019-01-22 MED ORDER — ROCURONIUM 10MG/ML (10ML) SYRINGE FOR MEDFUSION PUMP - OPTIME
INTRAVENOUS | Status: DC | PRN
  Administered 2019-01-22: 40 mg via INTRAVENOUS

## 2019-01-22 MED ORDER — SUGAMMADEX SODIUM 200 MG/2ML IV SOLN: INTRAVENOUS | Status: DC | PRN

## 2019-01-22 MED ORDER — BUPIVACAINE LIPOSOME 1.3 % IJ SUSP
INTRAMUSCULAR | Status: DC | PRN
  Administered 2019-01-22: 20 mL

## 2019-01-22 MED ORDER — SUCCINYLCHOLINE CHLORIDE 200 MG/10ML IV SOSY
PREFILLED_SYRINGE | INTRAVENOUS | Status: AC
  Filled 2019-01-22: qty 10

## 2019-01-22 MED ORDER — LACTATED RINGERS IV SOLN
INTRAVENOUS | Status: DC | PRN
  Administered 2019-01-22: 07:00:00 via INTRAVENOUS

## 2019-01-22 MED ORDER — HYDROCODONE-ACETAMINOPHEN 7.5-325 MG PO TABS
1.0000 | ORAL_TABLET | Freq: Once | ORAL | Status: AC | PRN
  Administered 2019-01-22: 1 via ORAL
  Filled 2019-01-22: qty 1

## 2019-01-22 MED ORDER — 0.9 % SODIUM CHLORIDE (POUR BTL) OPTIME
TOPICAL | Status: DC | PRN
  Administered 2019-01-22: 1000 mL

## 2019-01-22 MED ORDER — MIDAZOLAM HCL 2 MG/2ML IJ SOLN
INTRAMUSCULAR | Status: AC
  Filled 2019-01-22: qty 2

## 2019-01-22 MED ORDER — PROPOFOL 10 MG/ML IV BOLUS
INTRAVENOUS | Status: AC
  Filled 2019-01-22: qty 20

## 2019-01-22 MED ORDER — CEFAZOLIN SODIUM-DEXTROSE 2-4 GM/100ML-% IV SOLN
2.0000 g | INTRAVENOUS | Status: AC
  Administered 2019-01-22: 2 g via INTRAVENOUS
  Filled 2019-01-22: qty 100

## 2019-01-22 MED ORDER — FENTANYL CITRATE (PF) 250 MCG/5ML IJ SOLN
INTRAMUSCULAR | Status: AC
  Filled 2019-01-22: qty 5

## 2019-01-22 MED ORDER — FENTANYL CITRATE (PF) 100 MCG/2ML IJ SOLN
INTRAMUSCULAR | Status: DC | PRN
  Administered 2019-01-22 (×4): 50 ug via INTRAVENOUS

## 2019-01-22 MED ORDER — PROMETHAZINE HCL 25 MG/ML IJ SOLN: 6.2500 mg | INTRAMUSCULAR | Status: DC | PRN

## 2019-01-22 MED ORDER — ROCURONIUM BROMIDE 10 MG/ML (PF) SYRINGE
PREFILLED_SYRINGE | INTRAVENOUS | Status: AC
  Filled 2019-01-22: qty 10

## 2019-01-22 MED ORDER — SUGAMMADEX SODIUM 200 MG/2ML IV SOLN
INTRAVENOUS | Status: DC | PRN
  Administered 2019-01-22: 360 mg via INTRAVENOUS

## 2019-01-22 SURGICAL SUPPLY — 45 items
ADH SKN CLS APL DERMABOND .7 (GAUZE/BANDAGES/DRESSINGS) ×1
BLADE SURG SZ11 CARB STEEL (BLADE) ×2 IMPLANT
CLOTH BEACON ORANGE TIMEOUT ST (SAFETY) ×2 IMPLANT
COVER LIGHT HANDLE STERIS (MISCELLANEOUS) ×4 IMPLANT
DERMABOND ADVANCED (GAUZE/BANDAGES/DRESSINGS) ×1
DERMABOND ADVANCED .7 DNX12 (GAUZE/BANDAGES/DRESSINGS) IMPLANT
ELECT REM PT RETURN 9FT ADLT (ELECTROSURGICAL) ×2
ELECTRODE REM PT RTRN 9FT ADLT (ELECTROSURGICAL) ×1 IMPLANT
FILTER SMOKE EVAC LAPAROSHD (FILTER) ×2 IMPLANT
GAUZE SPONGE 4X4 12PLY STRL (GAUZE/BANDAGES/DRESSINGS) ×3 IMPLANT
GLOVE BIO SURGEON STRL SZ7 (GLOVE) ×1 IMPLANT
GLOVE BIOGEL PI IND STRL 7.0 (GLOVE) ×1 IMPLANT
GLOVE BIOGEL PI IND STRL 8 (GLOVE) ×1 IMPLANT
GLOVE BIOGEL PI INDICATOR 7.0 (GLOVE) ×3
GLOVE BIOGEL PI INDICATOR 8 (GLOVE) ×1
GLOVE ECLIPSE 8.0 STRL XLNG CF (GLOVE) ×2 IMPLANT
GOWN STRL REUS W/TWL LRG LVL3 (GOWN DISPOSABLE) ×2 IMPLANT
GOWN STRL REUS W/TWL XL LVL3 (GOWN DISPOSABLE) ×2 IMPLANT
INST SET LAPROSCOPIC GYN AP (KITS) ×2 IMPLANT
KIT TURNOVER KIT A (KITS) ×2 IMPLANT
MANIFOLD NEPTUNE II (INSTRUMENTS) ×2 IMPLANT
NDL HYPO 18GX1.5 BLUNT FILL (NEEDLE) ×1 IMPLANT
NEEDLE HYPO 18GX1.5 BLUNT FILL (NEEDLE) ×2 IMPLANT
NEEDLE HYPO 22GX1.5 SAFETY (NEEDLE) ×2 IMPLANT
NEEDLE INSUFFLATION 120MM (ENDOMECHANICALS) ×2 IMPLANT
NS IRRIG 1000ML POUR BTL (IV SOLUTION) ×2 IMPLANT
PACK PERI GYN (CUSTOM PROCEDURE TRAY) ×2 IMPLANT
PAD ARMBOARD 7.5X6 YLW CONV (MISCELLANEOUS) ×2 IMPLANT
SET BASIN LINEN APH (SET/KITS/TRAYS/PACK) ×2 IMPLANT
SHEARS HARMONIC ACE PLUS 36CM (ENDOMECHANICALS) ×2 IMPLANT
SLEEVE ENDOPATH XCEL 5M (ENDOMECHANICALS) ×2 IMPLANT
SOLUTION ANTI FOG 6CC (MISCELLANEOUS) ×2 IMPLANT
SUT MNCRL AB 4-0 PS2 18 (SUTURE) ×1 IMPLANT
SUT VICRYL 0 UR6 27IN ABS (SUTURE) ×3 IMPLANT
SUT VICRYL AB 3-0 FS1 BRD 27IN (SUTURE) ×2 IMPLANT
SYR 10ML LL (SYRINGE) ×2 IMPLANT
SYR 20CC LL (SYRINGE) ×2 IMPLANT
SYS BAG RETRIEVAL 10MM (BASKET) ×4
SYSTEM BAG RETRIEVAL 10MM (BASKET) ×1 IMPLANT
TRAY FOLEY W/BAG SLVR 16FR (SET/KITS/TRAYS/PACK) ×2
TRAY FOLEY W/BAG SLVR 16FR ST (SET/KITS/TRAYS/PACK) ×1 IMPLANT
TROCAR ENDO BLADELESS 11MM (ENDOMECHANICALS) ×2 IMPLANT
TROCAR XCEL NON-BLD 5MMX100MML (ENDOMECHANICALS) ×2 IMPLANT
TUBING INSUF HEATED (TUBING) ×2 IMPLANT
WARMER LAPAROSCOPE (MISCELLANEOUS) ×2 IMPLANT

## 2019-01-22 NOTE — Discharge Instructions (Signed)
Laparoscopic removal of ovaries,  Care After This sheet gives you information about how to care for yourself after your procedure. Your health care provider may also give you more specific instructions. If you have problems or questions, contact your health care provider. What can I expect after the procedure? After the procedure, it is common to have:  Mild cramping in your lower abdomen.  Tiredness (fatigue). You may feel tired for a few days. Follow these instructions at home: Incision care   Follow instructions from your health care provider about how to take care of your incisions. Make sure you: ? Wash your hands with soap and water before and after you change your bandage (dressing). If soap and water are not available, use hand sanitizer. ? Change your dressing as told by your health care provider. ? Leave stitches (sutures), skin glue, or adhesive strips in place. These skin closures may need to stay in place for 2 weeks or longer. If adhesive strip edges start to loosen and curl up, you may trim the loose edges. Do not remove adhesive strips completely unless your health care provider tells you to do that.  Check your incision areas every day for signs of infection. Check for: ? Redness, swelling, or pain that gets worse. ? Fluid or blood. ? Warmth. ? Pus or a bad smell.  Keep your incision areas clean and dry.  Do not take baths, swim, or use a hot tub until your health care provider approves. Ask your health care provider if you may take showers. You may only be allowed to take sponge baths. Activity  Return to your normal activities as told by your health care provider. Ask your health care provider what activities are safe for you. Ask about: ? Returning to work and exercise. ? Any weight restrictions you may have. ? When you can resume sexual activity.  Rest as told by your health care provider.  Avoid sitting for a long time without moving. Get up to take short  walks every 1-2 hours. This is important to improve blood flow and breathing. Ask for help if you feel weak or unsteady. General instructions  Take over-the-counter and prescription medicines only as told by your health care provider.  Ask your health care provider if the medicine prescribed to you: ? Requires you to avoid driving or using heavy machinery. ? Can cause constipation. You may need to take these actions to prevent or treat constipation:  Drink enough fluid to keep your urine pale yellow.  Take over-the-counter or prescription medicines.  Eat foods that are high in fiber, such as beans, whole grains, and fresh fruits and vegetables.  Limit foods that are high in fat and processed sugars, such as fried or sweet foods.  Keep all follow-up visits as told by your health care provider. This is important. Contact a health care provider if:  You have unusual bleeding or discharge from your vagina.  You have pain in your abdomen that gets worse or does not get better with medicine.  You feel nauseous.  You have redness, swelling, or pain around any of your incisions.  You have fluid or blood coming from any of your incisions.  Any of your incisions feel warm to the touch.  You have pus or a bad smell coming from any of your incisions. Get help right away if:  You have a fever.  You have a lot of unusual bleeding or discharge from your vagina.  You have severe pain  in your abdomen.  You cannot stop vomiting.  You have nausea that does not get better.  You faint.  You are unable to empty your bladder. Summary  After the procedure, it is common to have mild cramping and to feel tired for a few days.  Return to your normal activities as told by your health care provider. Ask your health care provider what activities are safe for you.  Contact a health care provider if you have complications, such as unusual bleeding or discharge, nausea, pain that gets worse, or  fluid or blood coming from any of your incisions.  Keep all follow-up visits as told by your health care provider. This information is not intended to replace advice given to you by your health care provider. Make sure you discuss any questions you have with your health care provider. Document Released: 04/20/2011 Document Revised: 07/17/2018 Document Reviewed: 07/17/2018 Elsevier Patient Education  2020 [place] Anesthesia, Adult, Care After This sheet gives you information about how to care for yourself after your procedure. Your health care provider may also give you more specific instructions. If you have problems or questions, contact your health care provider. What can I expect after the procedure? After the procedure, the following side effects are common:  Pain or discomfort at the IV site.  Nausea.  Vomiting.  Sore throat.  Trouble concentrating.  Feeling cold or chills.  Weak or tired.  Sleepiness and fatigue.  Soreness and body aches. These side effects can affect parts of the body that were not involved in surgery. Follow these instructions at home:  For at least 24 hours after the procedure:  Have a responsible adult stay with you. It is important to have someone help care for you until you are awake and alert.  Rest as needed.  Do not: ? Participate in activities in which you could fall or become injured. ? Drive. ? Use heavy machinery. ? Drink alcohol. ? Take sleeping pills or medicines that cause drowsiness. ? Make important decisions or sign legal documents. ? Take care of children on your own. Eating and drinking  Follow any instructions from your health care provider about eating or drinking restrictions.  When you feel hungry, start by eating small amounts of foods that are soft and easy to digest (bland), such as toast. Gradually return to your regular diet.  Drink enough fluid to keep your urine pale yellow.  If you vomit,  rehydrate by drinking water, juice, or clear broth. General instructions  If you have sleep apnea, surgery and certain medicines can increase your risk for breathing problems. Follow instructions from your health care provider about wearing your sleep device: ? Anytime you are sleeping, including during daytime naps. ? While taking prescription pain medicines, sleeping medicines, or medicines that make you drowsy.  Return to your normal activities as told by your health care provider. Ask your health care provider what activities are safe for you.  Take over-the-counter and prescription medicines only as told by your health care provider.  If you smoke, do not smoke without supervision.  Keep all follow-up visits as told by your health care provider. This is important. Contact a health care provider if:  You have nausea or vomiting that does not get better with medicine.  You cannot eat or drink without vomiting.  You have pain that does not get better with medicine.  You are unable to pass urine.  You develop a skin rash.  You  have a fever.  You have redness around your IV site that gets worse. Get help right away if:  You have difficulty breathing.  You have chest pain.  You have blood in your urine or stool, or you vomit blood. Summary  After the procedure, it is common to have a sore throat or nausea. It is also common to feel tired.  Have a responsible adult stay with you for the first 24 hours after general anesthesia. It is important to have someone help care for you until you are awake and alert.  When you feel hungry, start by eating small amounts of foods that are soft and easy to digest (bland), such as toast. Gradually return to your regular diet.  Drink enough fluid to keep your urine pale yellow.  Return to your normal activities as told by your health care provider. Ask your health care provider what activities are safe for you. This information is not  intended to replace advice given to you by your health care provider. Make sure you discuss any questions you have with your health care provider. Document Released: 08/07/2000 Document Revised: 05/04/2017 Document Reviewed: 12/15/2016 Elsevier Patient Education  2020 [employer].

## 2019-01-22 NOTE — Anesthesia Preprocedure Evaluation (Signed)
Anesthesia Evaluation  Patient identified by MRN, date of birth, ID band Patient awake    Reviewed: Allergy & Precautions, NPO status , Patient's Chart, lab work & pertinent test results  Airway Mallampati: II  TM Distance: >3 FB Neck ROM: Full    Dental no notable dental hx. (+) Teeth Intact   Pulmonary neg pulmonary ROS,  S.Allergies -Alb -PRN   Pulmonary exam normal breath sounds clear to auscultation       Cardiovascular Exercise Tolerance: Good negative cardio ROS Normal cardiovascular examI Rhythm:Regular Rate:Normal  Denied CP/DOE Reports good ET   Neuro/Psych  Headaches, PSYCHIATRIC DISORDERS Depression    GI/Hepatic Neg liver ROS, GERD  Medicated and Controlled,  Endo/Other  negative endocrine ROS  Renal/GU negative Renal ROSH/o Kidney stones  negative genitourinary   Musculoskeletal negative musculoskeletal ROS (+)   Abdominal   Peds negative pediatric ROS (+)  Hematology negative hematology ROS (+)   Anesthesia Other Findings   Reproductive/Obstetrics negative OB ROS                             Anesthesia Physical Anesthesia Plan  ASA: II  Anesthesia Plan: General   Post-op Pain Management:    Induction: Intravenous  PONV Risk Score and Plan: 3 and Midazolam, Ondansetron, Treatment may vary due to age or medical condition and Dexamethasone  Airway Management Planned: Oral ETT  Additional Equipment:   Intra-op Plan:   Post-operative Plan: Extubation in OR  Informed Consent: I have reviewed the patients History and Physical, chart, labs and discussed the procedure including the risks, benefits and alternatives for the proposed anesthesia with the patient or authorized representative who has indicated his/her understanding and acceptance.     Dental advisory given  Plan Discussed with: CRNA  Anesthesia Plan Comments: (Plan Full PPE use Plan GETA D/W PT -WTP  with same after Q&A)        Anesthesia Quick Evaluation

## 2019-01-22 NOTE — Transfer of Care (Signed)
Immediate Anesthesia Transfer of Care Note  Patient: Heidi Payne  Procedure(s) Performed: LAPAROSCOPIC SALPINGO OOPHORECTOMY (Bilateral Abdomen)  Patient Location: PACU  Anesthesia Type:General  Level of Consciousness: awake  Airway & Oxygen Therapy: Patient Spontanous Breathing  Post-op Assessment: Report given to RN and Post -op Vital signs reviewed and stable  Post vital signs: Reviewed and stable  Last Vitals:  Vitals Value Taken Time  BP 115/68 01/22/19 0907  Temp    Pulse 110 01/22/19 0910  Resp 8 01/22/19 0910  SpO2 96 % 01/22/19 0910  Vitals shown include unvalidated device data.  Last Pain:  Vitals:   01/22/19 0906  TempSrc:   PainSc: (P) 0-No pain      Patients Stated Pain Goal: 6 ([id number])  Complications: No apparent anesthesia complications

## 2019-01-22 NOTE — H&P (Signed)
Preoperative History and Physical  Heidi Payne is a 53 y.o. [id number] with No LMP recorded. (Menstrual status: Irregular Periods). admitted for a laparoscopic BSO for complex 5.6 cm left ovarian mass, CA 125 is normal, right ovary is normal.    PMH:    Past Medical History:  Diagnosis Date  . Anginal pain ([place])   . Depression    hitory of  . GERD (gastroesophageal reflux disease)   . Headache   . Hyperlipidemia   . Kidney stones   . Mental disorder    depression.  . Vitiligo     PSH:     Past Surgical History:  Procedure Laterality Date  . APPENDECTOMY     1975  . CARDIAC CATHETERIZATION N/A 12/31/2015   Procedure: Left Heart Cath and Coronary Angiography;  Surgeon: Jettie Booze, MD;  Location: [facility];  Service: Cardiovascular;  Laterality: N/A;  . CESAREAN SECTION W/BTL  2005  . CHOLECYSTECTOMY  11/27/2011   Procedure: LAPAROSCOPIC CHOLECYSTECTOMY;  Surgeon: Jamesetta So, MD;  Location: [facility];  Service: General;  Laterality: N/A;  . COLONOSCOPY  03/19/2012   Procedure: COLONOSCOPY;  Surgeon: Jamesetta So, MD;  Location: [facility];  Service: Gastroenterology;  Laterality: N/A;  . DILATION AND CURETTAGE OF UTERUS    . TUBAL LIGATION      POb/GynH:      OB History    Gravida  5   Para  2   Term      Preterm      AB  3   Living  2     SAB  3   TAB      Ectopic      Multiple      Live Births  2           SH:   Social History   Tobacco Use  . Smoking status: Never Smoker  . Smokeless tobacco: Never Used  Substance Use Topics  . Alcohol use: Yes    Comment: occassionally  . Drug use: No    FH:    Family History  Problem Relation Age of Onset  . Cancer Mother        bone brain cervical  . Cancer Father        colon  . Diabetes Father   . Hypertension Father   . Cancer Maternal Aunt        breast  . Cancer Paternal Aunt        esophageal, lung  . Cancer Maternal Aunt        breast  . Cancer Maternal  Aunt        breast     Allergies: No Known Allergies  Medications:       Current Facility-Administered Medications:  .  bupivacaine liposome (EXPAREL) 1.3 % injection 266 mg, 20 mL, Infiltration, Once, Dink Creps H, MD .  ceFAZolin (ANCEF) IVPB 2g/100 mL premix, 2 g, Intravenous, On Call to OR, Florian Buff, MD  Review of Systems:   Review of Systems  Constitutional: Negative for fever, chills, weight loss, malaise/fatigue and diaphoresis.  HENT: Negative for hearing loss, ear pain, nosebleeds, congestion, sore throat, neck pain, tinnitus and ear discharge.   Eyes: Negative for blurred vision, double vision, photophobia, pain, discharge and redness.  Respiratory: Negative for cough, hemoptysis, sputum production, shortness of breath, wheezing and stridor.   Cardiovascular: Negative for chest pain, palpitations, orthopnea, claudication, leg swelling and PND.  Gastrointestinal: Positive  for abdominal pain. Negative for heartburn, nausea, vomiting, diarrhea, constipation, blood in stool and melena.  Genitourinary: Negative for dysuria, urgency, frequency, hematuria and flank pain.  Musculoskeletal: Negative for myalgias, back pain, joint pain and falls.  Skin: Negative for itching and rash.  Neurological: Negative for dizziness, tingling, tremors, sensory change, speech change, focal weakness, seizures, loss of consciousness, weakness and headaches.  Endo/Heme/Allergies: Negative for environmental allergies and polydipsia. Does not bruise/bleed easily.  Psychiatric/Behavioral: Negative for depression, suicidal ideas, hallucinations, memory loss and substance abuse. The patient is not nervous/anxious and does not have insomnia.      PHYSICAL EXAM:  Blood pressure (!) 144/93, pulse 91, temperature 97.7 F (36.5 C), temperature source Oral, resp. rate 16, SpO2 99 %.    Vitals reviewed. Constitutional: She is oriented to person, place, and time. She appears well-developed and  well-nourished.  HENT:  Head: Normocephalic and atraumatic.  Right Ear: External ear normal.  Left Ear: External ear normal.  Nose: Nose normal.  Mouth/Throat: Oropharynx is clear and moist.  Eyes: Conjunctivae and EOM are normal. Pupils are equal, round, and reactive to light. Right eye exhibits no discharge. Left eye exhibits no discharge. No scleral icterus.  Neck: Normal range of motion. Neck supple. No tracheal deviation present. No thyromegaly present.  Cardiovascular: Normal rate, regular rhythm, normal heart sounds and intact distal pulses.  Exam reveals no gallop and no friction rub.   No murmur heard. Respiratory: Effort normal and breath sounds normal. No respiratory distress. She has no wheezes. She has no rales. She exhibits no tenderness.  GI: Soft. Bowel sounds are normal. She exhibits no distension and no mass. There is tenderness. There is no rebound and no guarding.  Genitourinary:       Vulva is normal without lesions Vagina is pink moist without discharge Cervix normal in appearance and pap is normal Uterus is normal size, contour, position, consistency, mobility, non-tender Adnexa is per sonogram Musculoskeletal: Normal range of motion. She exhibits no edema and no tenderness.  Neurological: She is alert and oriented to person, place, and time. She has normal reflexes. She displays normal reflexes. No cranial nerve deficit. She exhibits normal muscle tone. Coordination normal.  Skin: Skin is warm and dry. No rash noted. No erythema. No pallor.  Psychiatric: She has a normal mood and affect. Her behavior is normal. Judgment and thought content normal.    Labs: Results for orders placed or performed during the hospital encounter of 01/21/19 (from the past 336 hour(s))  Urinalysis, Routine w reflex microscopic   Collection Time: 01/21/19  8:18 AM  Result Value Ref Range   Color, Urine YELLOW YELLOW   APPearance CLEAR CLEAR   Specific Gravity, Urine 1.018 1.005 -  1.030   pH 7.0 5.0 - 8.0   Glucose, UA NEGATIVE NEGATIVE mg/dL   Hgb urine dipstick NEGATIVE NEGATIVE   Bilirubin Urine NEGATIVE NEGATIVE   Ketones, ur 20 (A) NEGATIVE mg/dL   Protein, ur NEGATIVE NEGATIVE mg/dL   Nitrite NEGATIVE NEGATIVE   Leukocytes,Ua NEGATIVE NEGATIVE  CBC   Collection Time: 01/21/19  8:28 AM  Result Value Ref Range   WBC 5.5 4.0 - 10.5 K/uL   RBC 5.05 3.87 - 5.11 MIL/uL   Hemoglobin 12.4 12.0 - 15.0 g/dL   HCT 41.0 36.0 - 46.0 %   MCV 81.2 80.0 - 100.0 fL   MCH 24.6 (L) 26.0 - 34.0 pg   MCHC 30.2 30.0 - 36.0 g/dL   RDW 15.6 (H) 11.5 -  15.5 %   Platelets 312 150 - 400 K/uL   nRBC 0.0 0.0 - 0.2 %  Comprehensive metabolic panel   Collection Time: 01/21/19  8:28 AM  Result Value Ref Range   Sodium 139 135 - 145 mmol/L   Potassium 3.4 (L) 3.5 - 5.1 mmol/L   Chloride 107 98 - 111 mmol/L   CO2 22 22 - 32 mmol/L   Glucose, Bld 106 (H) 70 - 99 mg/dL   BUN 13 6 - 20 mg/dL   Creatinine, Ser 0.73 0.44 - 1.00 mg/dL   Calcium 8.5 (L) 8.9 - 10.3 mg/dL   Total Protein 7.3 6.5 - 8.1 g/dL   Albumin 4.2 3.5 - 5.0 g/dL   AST 20 15 - 41 U/L   ALT 24 0 - 44 U/L   Alkaline Phosphatase 84 38 - 126 U/L   Total Bilirubin 0.7 0.3 - 1.2 mg/dL   GFR calc non Af Amer >60 >60 mL/min   GFR calc Af Amer >60 >60 mL/min   Anion gap 10 5 - 15  hCG, quantitative, pregnancy   Collection Time: 01/21/19  8:28 AM  Result Value Ref Range   hCG, Beta Chain, Quant, S <1 <5 mIU/mL  Rapid HIV screen (HIV 1/2 Ab+Ag)   Collection Time: 01/21/19  8:28 AM  Result Value Ref Range   HIV-1 P24 Antigen - HIV24 NON REACTIVE NON REACTIVE   HIV 1/2 Antibodies NON REACTIVE NON REACTIVE   Interpretation (HIV Ag Ab)      A non reactive test result means that HIV 1 or HIV 2 antibodies and HIV 1 p24 antigen were not detected in the specimen.  Type and screen   Collection Time: 01/21/19  8:28 AM  Result Value Ref Range   ABO/RH(D) AB NEG    Antibody Screen NEG    Sample Expiration 02/04/2019,2359     Extend sample reason      NO TRANSFUSIONS OR PREGNANCY IN THE PAST 3 MONTHS Performed at [facility], [address] [place], [place] [address]   Results for orders placed or performed during the hospital encounter of 01/21/19 (from the past 336 hour(s))  SARS CORONAVIRUS 2 (TAT 6-24 HRS) Nasopharyngeal Nasopharyngeal Swab   Collection Time: 01/21/19  7:25 AM   Specimen: Nasopharyngeal Swab  Result Value Ref Range   SARS Coronavirus 2 NEGATIVE NEGATIVE  Results for orders placed or performed in visit on 01/14/19 (from the past 336 hour(s))  POCT urine pregnancy   Collection Time: 01/14/19  4:11 PM  Result Value Ref Range   Preg Test, Ur Negative Negative  Results for orders placed or performed in visit on 01/09/19 (from the past 336 hour(s))  Cytology - PAP( [facility])   Collection Time: 01/09/19 12:00 AM  Result Value Ref Range   Adequacy (A)     Satisfactory for evaluation  endocervical/transformation zone component PRESENT.   Diagnosis -  ATYPICAL GLANDULAR CELLS, FAVOR ENDOMETRIAL (A)    Diagnosis (A)     ENDOMETRIAL CELLS PRESENT IN A POSTMENOPAUSAL WOMAN. CORRELATION WITH CLINICAL HISTORY AND/OR TISSUE STUDIES ARE RECOMMENDED AS CLINICALLY INDICATED.   HPV NOT Detected    Material Submitted CervicoVaginal Pap [ThinPrep Imaged] (A)   CA 125   Collection Time: 01/09/19  3:19 PM  Result Value Ref Range   Cancer Antigen (CA) 125 25.7 0.0 - 38.1 U/mL    EKG: Orders placed or performed in visit on 01/21/19  . EKG 12-Lead    Imaging Studies: 5.6 cm complex  left ovarian MASS WITH 2 SOLID NODULES AND CYSTIC COMPONENT    Assessment: Complex left ovarian mass Normal CA 125 Right ovary normal  Plan: Laparoscopic removal of both tubes and ovaries  Florian Buff 01/22/2019 7:13 AM

## 2019-01-22 NOTE — Anesthesia Procedure Notes (Addendum)
Procedure Name: Intubation Date/Time: 01/22/2019 7:41 AM Performed by: Ollen Bowl, CRNA Pre-anesthesia Checklist: Patient identified, Patient being monitored, Timeout performed, Emergency Drugs available and Suction available Patient Re-evaluated:Patient Re-evaluated prior to induction Oxygen Delivery Method: Circle System Utilized Preoxygenation: Pre-oxygenation with 100% oxygen Induction Type: IV induction Ventilation: Mask ventilation without difficulty Laryngoscope Size: Mac and 3 Grade View: Grade II Tube type: Oral Tube size: 7.0 mm Number of attempts: 1 Airway Equipment and Method: stylet Placement Confirmation: ETT inserted through vocal cords under direct vision,  positive ETCO2 and breath sounds checked- equal and bilateral Secured at: 20 cm Tube secured with: Tape Dental Injury: Teeth and Oropharynx as per pre-operative assessment

## 2019-01-22 NOTE — Op Note (Signed)
Preoperative diagnosis:  1.  Complex left ovarian mass, heavily calcified                                         2.  normal preoperative CA 125  Postoperative diagnosis:  Same as above  Procedure:  Laparoscopic bilateral salpingo-oophorectomy  Surgeon:  Jacelyn Grip MD  Anesthesia:  Gen. 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  This did not appear to be a malignant condition.  Description of operation:  The patient was taken to the operating room and placed in the supine position where she underwent general endotracheal anesthesia.  She was placed in the low lithotomy position.  She was prepped and draped in the usual sterile fashion and a Foley catheter was placed.  An incision was made in the umbilicus and a Veres needle was placed into the peritoneal cavity with one pass without difficulty.  The peritoneal cavity was then insufflated.  An 11 mm non-bladed direct visualization trocar was then used and placed into the peritoneal cavity with direct laparoscopic visualization again without difficulty.  Incisions were then made in the left lower quadrant and right lower quadrant.  A 5 mm trocar was placed in the left lower quadrant and a 5 mm trocar was placed in the midline at the suprapubic level.  Both were done under direct visualization without difficulty.  The right ovary was grasped.  The harmonic scalpel was used and the infundibulopelvic ligament on the right was coagulated and then transected. The remaining peritoneal attachments of the right ovary were also taken down hemostatically.  The left ovary was identified and again the infundibulopelvic ligament on the left was coagulated and transected.  The left ovary was removed hemostatically as well.  An Endo Catch was placed and the right ovary was put in the bag and removed through the right lower quadrant incision  without difficulty.  The left ovary was also removed without difficulty using a separate Endo Catch bag, I had to enlarge the umbilical incision to accommodate the 5.6 cm mass.     The umbilical fascia was closed with a running 0 Vicryl suture.  The subcutaneous tissue was reapproximated loosely using 0 Vicryl. All 3 skin incisions were closed using 3-0 vicryl in a subcuticular manner.  All 3 were injected with exparel also.  Dermabond was placed over all 3 inciisions. The patient was awakened from anesthesia and taken to the recovery room in good stable condition with all counts being correct x3.  The estimated blood loss for the procedure is 20 cc.  There was no bleeding from the intraperitoneal surgery at all.  The patient received Ancef and Toradol preoperatively.     Florian Buff, MD 01/22/2019 8:56 AM

## 2019-01-23 ENCOUNTER — Encounter (HOSPITAL_COMMUNITY): Payer: Self-pay | Admitting: Obstetrics & Gynecology

## 2019-01-23 NOTE — Anesthesia Postprocedure Evaluation (Signed)
Anesthesia Post Note Late Entry for 01/22/19 at 0925  Patient: Heidi Payne  Procedure(s) Performed: LAPAROSCOPIC SALPINGO OOPHORECTOMY (Bilateral Abdomen)  Patient location during evaluation: PACU Anesthesia Type: General Level of consciousness: awake and alert, oriented and patient cooperative Pain management: pain level controlled Vital Signs Assessment: post-procedure vital signs reviewed and stable Respiratory status: spontaneous breathing Cardiovascular status: stable Postop Assessment: no apparent nausea or vomiting Anesthetic complications: no     Last Vitals:  Vitals:   01/22/19 0940 01/22/19 0949  BP: 121/86 127/76  Pulse: 97 96  Resp: 11   Temp:  36.5 C  SpO2: 96% 98%    Last Pain:  Vitals:   01/22/19 0949  TempSrc: Oral  PainSc: 0-No pain                 ADAMS, AMY A

## 2019-01-24 ENCOUNTER — Telehealth: Payer: Self-pay | Admitting: *Deleted

## 2019-01-24 ENCOUNTER — Telehealth: Payer: Self-pay | Admitting: Obstetrics & Gynecology

## 2019-01-24 MED ORDER — NITROFURANTOIN MONOHYD MACRO 100 MG PO CAPS: 100.0000 mg | ORAL_CAPSULE | Freq: Two times a day (BID) | ORAL | 0 refills | Status: DC

## 2019-01-24 NOTE — Telephone Encounter (Signed)
Rx macrobid  

## 2019-01-24 NOTE — Telephone Encounter (Signed)
Patient informed Dr Elonda Husky sent in script for UTI. Advised to take all of the medication even if her symptoms are gone.  Verbalized understanding.

## 2019-01-24 NOTE — Telephone Encounter (Signed)
Patient states she went to the bathroom during the night and noticed blood in the toilet and is continuing to notice it every time she urinates. She is also having burning with urination as well and has been since the surgery.  No vaginal bleeding noted per patient and no other complaints.  She is concerned she may have an UTI. Please advise.

## 2019-02-03 ENCOUNTER — Ambulatory Visit (INDEPENDENT_AMBULATORY_CARE_PROVIDER_SITE_OTHER): Payer: 59 | Admitting: Obstetrics & Gynecology

## 2019-02-03 ENCOUNTER — Other Ambulatory Visit: Payer: Self-pay

## 2019-02-03 ENCOUNTER — Encounter: Payer: Self-pay | Admitting: Obstetrics & Gynecology

## 2019-02-03 VITALS — BP 164/97 | HR 102 | Ht 66.0 in | Wt 201.0 lb

## 2019-02-03 DIAGNOSIS — Z9889 Other specified postprocedural states: Secondary | ICD-10-CM

## 2019-02-03 NOTE — Progress Notes (Signed)
  HPI: Patient returns for routine postoperative follow-up having undergone lap BSO on 01/22/2019.  The patient's immediate postoperative recovery has been unremarkable. Since hospital discharge the patient reports no problems.   Current Outpatient Medications: albuterol (PROVENTIL HFA;VENTOLIN HFA) 108 (90 Base) MCG/ACT inhaler, Inhale 2 puffs into the lungs every 6 (six) hours as needed for wheezing or shortness of breath., Disp: , Rfl:  atorvastatin (LIPITOR) 20 MG tablet, Take 20 mg by mouth daily., Disp: , Rfl:  buPROPion (WELLBUTRIN XL) 150 MG 24 hr tablet, Take 150 mg by mouth daily., Disp: , Rfl:  DULoxetine (CYMBALTA) 60 MG capsule, Take 60 mg by mouth 2 (two) times daily. , Disp: , Rfl:  loratadine (CLARITIN) 10 MG tablet, Take 10 mg by mouth daily., Disp: , Rfl:  omeprazole (PRILOSEC) 40 MG capsule, Take 40 mg by mouth daily., Disp: , Rfl:  oxyCODONE-acetaminophen (PERCOCET) 5-325 MG tablet, Take 1-2 tablets by mouth every 4 (four) hours as needed for severe pain., Disp: 30 tablet, Rfl: 0 tamsulosin (FLOMAX) 0.4 MG CAPS capsule, Take 0.4 mg by mouth at bedtime. , Disp: , Rfl:  topiramate (TOPAMAX) 50 MG tablet, Take 50 mg by mouth at bedtime. , Disp: , Rfl: 1 ketorolac (TORADOL) 10 MG tablet, Take 1 tablet (10 mg total) by mouth every 8 (eight) hours as needed. (Patient not taking: Reported on 02/03/2019), Disp: 15 tablet, Rfl: 0 nitrofurantoin, macrocrystal-monohydrate, (MACROBID) 100 MG capsule, Take 1 capsule (100 mg total) by mouth 2 (two) times daily. (Patient not taking: Reported on 02/03/2019), Disp: 14 capsule, Rfl: 0 ondansetron (ZOFRAN ODT) 8 MG disintegrating tablet, Take 1 tablet (8 mg total) by mouth every 8 (eight) hours as needed for nausea or vomiting. (Patient not taking: Reported on 02/03/2019), Disp: 20 tablet, Rfl: 0  No current facility-administered medications for this visit.     Blood pressure (!) 164/97, pulse (!) 102, height 5\' 6"  (1.676 m), weight 201 lb (91.2  kg), last menstrual period 12/12/2018.  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

## 2019-02-05 ENCOUNTER — Encounter: Payer: Self-pay | Admitting: Obstetrics & Gynecology

## 2019-02-05 NOTE — Progress Notes (Signed)
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  The risks (including infection, bleeding, pain, and uterine perforation) and benefits of the procedure were explained to the patient and Written informed consent was obtained.  Antibiotic prophylaxis against endocarditis was not indicated.   The patient was placed in the dorsal lithotomy position.  Bimanual exam showed the uterus to be in the neutral position.  A Graves' speculum inserted in the vagina, and the cervix prepped with povidone iodine.  Endocervical curettage with a Kevorkian curette was not performed.   A sharp tenaculum was applied to the anterior lip of the cervix for stabilization.  A sterile uterine sound was used to sound the uterus to a depth of 7.5cm.  A Pipelle endometrial aspirator was used to sample the endometrium.  Sample was sent for pathologic examination.  Condition: Stable  Complications: None  Plan:  The patient was advised to call for any fever or for prolonged or severe pain or bleeding. She was advised to use OTC analgesics as needed for mild to moderate pain. She was advised to avoid vaginal intercourse for 48 hours or until the bleeding has completely stopped.  Attending Physician Documentation: I performed the procedure

## 2019-02-20 ENCOUNTER — Other Ambulatory Visit: Payer: Self-pay

## 2019-02-20 ENCOUNTER — Encounter: Payer: Self-pay | Admitting: Obstetrics and Gynecology

## 2019-02-20 ENCOUNTER — Ambulatory Visit (INDEPENDENT_AMBULATORY_CARE_PROVIDER_SITE_OTHER): Payer: 59 | Admitting: Obstetrics and Gynecology

## 2019-02-20 DIAGNOSIS — Z9889 Other specified postprocedural states: Secondary | ICD-10-CM | POA: Diagnosis not present

## 2019-02-20 NOTE — Progress Notes (Signed)
Pt presents with c/o of suture at lap incision site. Noted last evening after shower. Denies fever,chills or drainage See prior office notes   PE AF VSS Lungs clear Heart RRR Abd soft + BS small suture knot noted at incision site removed without problems no S/Sx of infection sites healing well  A/P Suture removal Resume normal ADL's. F/U PRN

## 2019-03-25 ENCOUNTER — Other Ambulatory Visit: Payer: Self-pay

## 2019-03-25 DIAGNOSIS — Z20822 Contact with and (suspected) exposure to covid-19: Secondary | ICD-10-CM

## 2019-03-28 LAB — NOVEL CORONAVIRUS, NAA: SARS-CoV-2, NAA: NOT DETECTED

## 2019-08-27 ENCOUNTER — Ambulatory Visit (INDEPENDENT_AMBULATORY_CARE_PROVIDER_SITE_OTHER): Payer: 59 | Admitting: Adult Health

## 2019-08-27 ENCOUNTER — Encounter: Payer: Self-pay | Admitting: Adult Health

## 2019-08-27 ENCOUNTER — Other Ambulatory Visit: Payer: Self-pay

## 2019-08-27 VITALS — BP 146/94 | HR 111 | Ht 66.0 in | Wt 205.4 lb

## 2019-08-27 DIAGNOSIS — L900 Lichen sclerosus et atrophicus: Secondary | ICD-10-CM | POA: Diagnosis not present

## 2019-08-27 DIAGNOSIS — L80 Vitiligo: Secondary | ICD-10-CM | POA: Diagnosis not present

## 2019-08-27 DIAGNOSIS — N9089 Other specified noninflammatory disorders of vulva and perineum: Secondary | ICD-10-CM

## 2019-08-27 DIAGNOSIS — L292 Pruritus vulvae: Secondary | ICD-10-CM | POA: Diagnosis not present

## 2019-08-27 MED ORDER — CLOBETASOL PROPIONATE 0.05 % EX CREA: 1.0000 "application " | TOPICAL_CREAM | Freq: Two times a day (BID) | CUTANEOUS | 3 refills | Status: DC

## 2019-08-27 NOTE — Progress Notes (Signed)
  Subjective:     Patient ID: Heidi Payne, female   DOB: 01-Oct-1965, 54 y.o.   MRN: [id number]  HPI [name] is a 54 year old white female, divorced, PM in complaining of left vulva itching and feels irritated, if scratches will bleed.  PCP is Dr Quillian Quince.  Review of Systems Has itching and irritation left labia on and off, bleeds if scratches   Reviewed past medical,surgical, social and family history. Reviewed medications and allergies.     Objective:   Physical Exam BP (!) 146/94 (BP Location: Left Arm, Patient Position: Sitting, Cuff Size: Normal)   Pulse (!) 111   Ht 5\' 6"  (1.676 m)   Wt 205 lb 6.4 oz (93.2 kg)   BMI 33.15 kg/m   Skin warm and dry.Pelvic: external genitalia has areas of white from vitiligo and at base of left labia the skin is thickened with some excoriation noted, HSV culture obtained, is tender, but mostly likely Lichen sclerosus and will rx temovate Examination chaperoned by Rolena Infante LPN    Assessment:     1. Vulvar itching HSV culture sent  2. Vulvar irritation HSV culture sent   3. Lichen sclerosus Will rx temovate Meds ordered this encounter  Medications  . clobetasol cream (TEMOVATE) 0.05 %    Sig: Apply 1 application topically 2 (two) times daily.    Dispense:  30 g    Refill:  3    Order Specific Question:   Supervising Provider    Answer:   Florian Buff [[id number]]  Showed her pictures in Genital Dermatology book and gave handout on LS Discussed is chronic and may need biopsy if does not respond to temovate   4. Vitiligo    Plan:     Return in 2 weeks for recheck

## 2019-08-27 NOTE — Patient Instructions (Signed)
Lichen Sclerosus Lichen sclerosus is a skin problem. It can happen on any part of the body, but it commonly involves the anal or genital areas. It can cause itching and discomfort in these areas. Treatment can help to control symptoms. When the genital area is affected, getting treatment is important because the condition can cause scarring that may lead to other problems. What are the causes? The cause of this condition is not known. It may be related to an overactive immune system or a lack of certain hormones. Lichen sclerosus is not an infection or a fungus, and it is not passed from one person to another (not contagious). What increases the risk? This condition is more likely to develop in women, usually after menopause. What are the signs or symptoms? Symptoms of this condition include:  Thin, wrinkled, white areas on the skin.  Thickened white areas on the skin.  Red and swollen patches (lesions) on the skin.  Tears or cracks in the skin.  Bruising.  Blood blisters.  Severe itching.  Pain, itching, or burning when urinating. Constipation is also common in people with lichen sclerosus. How is this diagnosed? This condition may be diagnosed with a physical exam. In some cases, a tissue sample (biopsy sample) may be removed to be looked at under a microscope. How is this treated? This condition is usually treated with medicated creams or ointments (topical steroids) that are applied over the affected areas. In some cases, treatment may also include medicines that are taken by mouth. Surgery may be needed in more severe cases that are causing problems such as scarring. Follow these instructions at home:  Take or use over-the-counter and prescription medicines only as told by your health care provider.  Use creams or ointments as told by your health care provider.  Do not scratch the affected areas of skin.  If you are a woman, be sure to keep the vaginal area as clean and dry  as possible.  Clean the affected area of skin gently with water. Avoid using rough towels or toilet paper.  Keep all follow-up visits as told by your health care provider. This is important. Contact a health care provider if:  You have increasing redness, swelling, or pain in the affected area.  You have fluid, blood, or pus coming from the affected area.  You have new lesions on your skin.  You have a fever.  You have pain during sex. Summary  Lichen sclerosus is a skin problem. When the genital area is affected, getting treatment is important because the condition can cause scarring that may lead to other problems.  This condition is usually treated with medicated creams or ointments (topical steroids) that are applied over the affected areas.  Take or use over-the-counter and prescription medicines only as told by your health care provider.  Contact a health care provider if you have new lesions on your skin, have pain during sex, or have increasing redness, swelling, or pain in the affected area.  Keep all follow-up visits as told by your health care provider. This is important. This information is not intended to replace advice given to you by your health care provider. Make sure you discuss any questions you have with your health care provider. Document Revised: 09/13/2017 Document Reviewed: 09/13/2017 Elsevier Patient Education  2020 [employer]  

## 2019-08-30 LAB — HERPES SIMPLEX VIRUS CULTURE

## 2019-09-10 ENCOUNTER — Other Ambulatory Visit: Payer: Self-pay

## 2019-09-10 ENCOUNTER — Ambulatory Visit (INDEPENDENT_AMBULATORY_CARE_PROVIDER_SITE_OTHER): Payer: 59 | Admitting: Adult Health

## 2019-09-10 ENCOUNTER — Encounter: Payer: Self-pay | Admitting: Adult Health

## 2019-09-10 ENCOUNTER — Ambulatory Visit: Payer: 59 | Admitting: Adult Health

## 2019-09-10 VITALS — BP 134/80 | HR 76 | Ht 66.0 in | Wt 209.0 lb

## 2019-09-10 DIAGNOSIS — L900 Lichen sclerosus et atrophicus: Secondary | ICD-10-CM | POA: Diagnosis not present

## 2019-09-10 DIAGNOSIS — L80 Vitiligo: Secondary | ICD-10-CM | POA: Diagnosis not present

## 2019-09-10 NOTE — Progress Notes (Signed)
  Subjective:     Patient ID: Heidi Payne, female   DOB: [birth year], 54 y.o.   MRN: [id number]  HPI Heidi Payne is a 54 year old white female,divorced, PM, back in follow up of vulva itching and irritation, treated with temovate, had negative HSV culture. PCP is Dr Heidi Payne.   Review of Systems No itching or irritation now Reviewed past medical,surgical, social and family history. Reviewed medications and allergies.     Objective:   Physical Exam   BP 134/80 (BP Location: Left Arm, Patient Position: Sitting, Cuff Size: Normal)   Pulse 76   Ht 5\' 6"  (1.676 m)   Wt 209 lb (94.8 kg)   BMI 33.73 kg/m   Skin warm and dry.Pelvic: external genitalia has vitiligo and the area a base of left labia has resolved, no excoriation, no tenderness. Examination chaperoned by Heidi Squibb LPN    Assessment:        1. Lichen sclerosus She is aware this is chronic condition Continue temovate, can use 2-3 x weekly now  2. Vitiligo  Plan:     Follow up prn

## 2019-11-27 ENCOUNTER — Other Ambulatory Visit (HOSPITAL_COMMUNITY): Payer: Self-pay | Admitting: Adult Health

## 2019-11-27 DIAGNOSIS — Z1231 Encounter for screening mammogram for malignant neoplasm of breast: Secondary | ICD-10-CM

## 2020-01-15 ENCOUNTER — Ambulatory Visit (HOSPITAL_COMMUNITY)
Admission: RE | Admit: 2020-01-15 | Discharge: 2020-01-15 | Disposition: A | Discharge status: Home / Self Care | Payer: No Typology Code available for payment source | Source: Ambulatory Visit | Attending: Adult Health | Admitting: Adult Health

## 2020-01-15 ENCOUNTER — Other Ambulatory Visit: Payer: Self-pay

## 2020-01-15 DIAGNOSIS — Z1231 Encounter for screening mammogram for malignant neoplasm of breast: Secondary | ICD-10-CM | POA: Diagnosis not present

## 2020-09-27 ENCOUNTER — Other Ambulatory Visit: Payer: Self-pay | Admitting: Adult Health

## 2020-09-27 ENCOUNTER — Ambulatory Visit (INDEPENDENT_AMBULATORY_CARE_PROVIDER_SITE_OTHER): Payer: No Typology Code available for payment source | Admitting: Adult Health

## 2020-09-27 ENCOUNTER — Other Ambulatory Visit: Payer: Self-pay

## 2020-09-27 ENCOUNTER — Encounter: Payer: Self-pay | Admitting: Adult Health

## 2020-09-27 VITALS — BP 143/91 | HR 92 | Ht 66.0 in | Wt 220.0 lb

## 2020-09-27 DIAGNOSIS — K649 Unspecified hemorrhoids: Secondary | ICD-10-CM | POA: Insufficient documentation

## 2020-09-27 DIAGNOSIS — L900 Lichen sclerosus et atrophicus: Secondary | ICD-10-CM

## 2020-09-27 DIAGNOSIS — L80 Vitiligo: Secondary | ICD-10-CM | POA: Diagnosis not present

## 2020-09-27 MED ORDER — HYDROCORT-PRAMOXINE (PERIANAL) 1-1 % EX FOAM: 1.0000 | Freq: Two times a day (BID) | CUTANEOUS | 3 refills | Status: DC

## 2020-09-27 NOTE — Progress Notes (Signed)
  Subjective:     Patient ID: Heidi Payne, female   DOB: April 02, 1966, 55 y.o.   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 She also thinks colonoscopy past due,she had with Dr Arnoldo Morale, before age 61. She brought labs in from Dr Quillian Quince will get scanned.  PCP is Dr Quillian Quince.  Review of Systems Has felt ?hemorrhoidm and it was hard and she made it bleed in the shower Reviewed past medical,surgical, social and family history. Reviewed medications and allergies.     Objective:   Physical Exam BP (!) 143/91 (BP Location: Left Arm, Patient Position: Sitting, Cuff Size: Normal)   Pulse 92   Ht 5\' 6"  (1.676 m)   Wt 220 lb (99.8 kg)   LMP 12/12/2018   BMI 35.51 kg/m    Skin warm and dry.Pelvic: external genitalia is normal in appearance no lesions,+vitilgp On rectal exam has hemorrhoid remnant externally and +hemorrhoid at 7 0' clock internally   Upstream - 09/27/20 0925      Pregnancy Intention Screening   Does the patient want to become pregnant in the next year? No    Does the patient's partner want to become pregnant in the next year? No    Would the patient like to discuss contraceptive options today? No      Contraception Wrap Up   Current Method Female Sterilization    End Method Female Sterilization    Contraception Counseling Provided No          Assessment:     1. Hemorrhoids, unspecified hemorrhoid type Will rx proctofoam Meds ordered this encounter  Medications  . hydrocortisone-pramoxine (PROCTOFOAM-HC) rectal foam    Sig: Place 1 applicator rectally 2 (two) times daily.    Dispense:  10 g    Refill:  3    Order Specific Question:   Supervising Provider    Answer:   Elonda Husky, LUTHER H [[id number]]    2. Lichen sclerosus Has temovate  3. Vitiligo    Plan:     Called Dr Arnoldo Morale office and appt made for 10/14/20 at 11 am

## 2020-10-07 ENCOUNTER — Other Ambulatory Visit: Payer: Self-pay | Admitting: Adult Health

## 2020-10-07 MED ORDER — PRAMOXINE HCL 0.5 % EX OINT: TOPICAL_OINTMENT | CUTANEOUS | 0 refills | Status: DC

## 2020-10-07 MED ORDER — HYDROCORTISONE ACETATE 25 MG RE SUPP: 25.0000 mg | Freq: Two times a day (BID) | RECTAL | 3 refills | Status: DC | PRN

## 2020-10-07 NOTE — Progress Notes (Signed)
Will rx anusol hc supp and pramoxine ointment, as proctofoam hc not covered by her insurance

## 2020-10-14 ENCOUNTER — Other Ambulatory Visit: Payer: Self-pay

## 2020-10-14 ENCOUNTER — Ambulatory Visit (INDEPENDENT_AMBULATORY_CARE_PROVIDER_SITE_OTHER): Payer: No Typology Code available for payment source | Admitting: General Surgery

## 2020-10-14 ENCOUNTER — Encounter: Payer: Self-pay | Admitting: General Surgery

## 2020-10-14 VITALS — BP 159/89 | HR 94 | Temp 98.6°F | Resp 12 | Ht 66.0 in | Wt 221.0 lb

## 2020-10-14 DIAGNOSIS — Z1211 Encounter for screening for malignant neoplasm of colon: Secondary | ICD-10-CM

## 2020-10-14 MED ORDER — SUTAB 1479-225-188 MG PO TABS: 1.0000 | ORAL_TABLET | Freq: Once | ORAL | 0 refills | Status: AC

## 2020-10-14 NOTE — Patient Instructions (Signed)
Colonoscopy, Adult A colonoscopy is a procedure to look at the entire large intestine. This procedure is done using a long, thin, flexible tube that has a camera on the end. You may have a colonoscopy:  As a part of normal colorectal screening.  If you have certain symptoms, such as: ? A low number of red blood cells in your blood (anemia). ? Diarrhea that does not go away. ? Pain in your abdomen. ? Blood in your stool. A colonoscopy can help screen for and diagnose medical problems, including:  Tumors.  Extra tissue that grows where mucus forms (polyps).  Inflammation.  Areas of bleeding. Tell your health care provider about:  Any allergies you have.  All medicines you are taking, including vitamins, herbs, eye drops, creams, and over-the-counter medicines.  Any problems you or family members have had with anesthetic medicines.  Any blood disorders you have.  Any surgeries you have had.  Any medical conditions you have.  Any problems you have had with having bowel movements.  Whether you are pregnant or may be pregnant. What are the risks? Generally, this is a safe procedure. However, problems may occur, including:  Bleeding.  Damage to your intestine.  Allergic reactions to medicines given during the procedure.  Infection. This is rare. What happens before the procedure?Bowel prep If you were prescribed a bowel prep to take by mouth (orally) to clean out your colon:  Take it as told by your health care provider. Starting the day before your procedure, you will need to drink a large amount of liquid medicine. The liquid will cause you to have many bowel movements of loose stool until your stool becomes almost clear or light green.  If your skin or the opening between the buttocks (anus) gets irritated from diarrhea, you may relieve the irritation using: ? Wipes with medicine in them, such as adult wet wipes with aloe and vitamin E. ? A product to soothe skin,  such as petroleum jelly.  If you vomit while drinking the bowel prep: ? Take a break for up to 60 minutes. ? Begin the bowel prep again. ? Call your health care provider if you keep vomiting or you cannot take the bowel prep without vomiting.  To clean out your colon, you may also be given: ? Laxative medicines. These help you have a bowel movement. ? Instructions for enema use. An enema is liquid medicine injected into your rectum. Medicines Ask your health care provider about:  Changing or stopping your regular medicines or supplements. This is especially important if you are taking iron supplements, diabetes medicines, or blood thinners.  Taking medicines such as aspirin and ibuprofen. These medicines can thin your blood. Do not take these medicines unless your health care provider tells you to take them.  Taking over-the-counter medicines, vitamins, herbs, and supplements. General instructions  Ask your health care provider what steps will be taken to help prevent infection. These may include washing skin with a germ-killing soap.  Plan to have someone take you home from the hospital or clinic. What happens during the procedure?  An IV will be inserted into one of your veins.  You may be given one or more of the following: ? A medicine to help you relax (sedative). ? A medicine to numb the area (local anesthetic). ? A medicine to make you fall asleep (general anesthetic). This is rarely needed.  You will lie on your side with your knees bent.  The tube will: ? Have  oil or gel put on it (be lubricated). ? Be inserted into your anus. ? Be gently eased through all parts of your large intestine.  Air will be sent into your colon to keep it open. This may cause some pressure or cramping.  Images will be taken with the camera and will appear on a screen.  A small tissue sample may be removed to be looked at under a microscope (biopsy). The tissue may be sent to a lab for  testing if any signs of problems are found.  If small polyps are found, they may be removed and checked for cancer cells.  When the procedure is finished, the tube will be removed. The procedure may vary among health care providers and hospitals.   What happens after the procedure?  Your blood pressure, heart rate, breathing rate, and blood oxygen level will be monitored until you leave the hospital or clinic.  You may have a small amount of blood in your stool.  You may pass gas and have mild cramping or bloating in your abdomen. This is caused by the air that was used to open your colon during the exam.  Do not drive for 24 hours after the procedure.  It is up to you to get the results of your procedure. Ask your health care provider, or the department that is doing the procedure, when your results will be ready. Summary  A colonoscopy is a procedure to look at the entire large intestine.  Follow instructions from your health care provider about eating and drinking before the procedure.  If you were prescribed an oral bowel prep to clean out your colon, take it as told by your health care provider.  During the colonoscopy, a flexible tube with a camera on its end is inserted into the anus and then passed into the other parts of the large intestine. This information is not intended to replace advice given to you by your health care provider. Make sure you discuss any questions you have with your health care provider. Document Revised: 11/22/2018 Document Reviewed: 11/22/2018 Elsevier Patient Education  [employer].

## 2020-10-14 NOTE — Progress Notes (Signed)
Heidi Payne; [id number]; [birth year]   HPI Patient is a 55 year old white female who was referred to my care by Gar Ponto for a screening colonoscopy.  Patient last had a colonoscopy in 2013.  She her father did have colon cancer.  She denies any significant abdominal pain, blood in her stools, diarrhea, constipation, or significant weight loss. Past Medical History:  Diagnosis Date  . Anginal pain ([place])   . Asthma   . Depression    hitory of  . GERD (gastroesophageal reflux disease)   . Headache   . Hyperlipidemia   . Hypertension   . Kidney stones   . Mental disorder    depression.  . Sleep apnea   . Vitiligo     Past Surgical History:  Procedure Laterality Date  . APPENDECTOMY     1975  . CARDIAC CATHETERIZATION N/A 12/31/2015   Procedure: Left Heart Cath and Coronary Angiography;  Surgeon: Jettie Booze, MD;  Location: [facility];  Service: Cardiovascular;  Laterality: N/A;  . CESAREAN SECTION W/BTL  2005  . CHOLECYSTECTOMY  11/27/2011   Procedure: LAPAROSCOPIC CHOLECYSTECTOMY;  Surgeon: Jamesetta So, MD;  Location: [facility];  Service: General;  Laterality: N/A;  . COLONOSCOPY  03/19/2012   Procedure: COLONOSCOPY;  Surgeon: Jamesetta So, MD;  [facility]: [facility];  Service: Gastroenterology;  Laterality: N/A;  . DILATION AND CURETTAGE OF UTERUS    . LAPAROSCOPIC SALPINGO OOPHERECTOMY Bilateral 01/22/2019   Procedure: LAPAROSCOPIC SALPINGO OOPHORECTOMY;  Surgeon: Florian Buff, MD;  Location: [facility];  Service: Gynecology;  Laterality: Bilateral;  . TUBAL LIGATION      Family History  Problem Relation Age of Onset  . Cancer Mother        bone brain cervical  . Cancer Father        colon  . Diabetes Father   . Hypertension Father   . Cancer Maternal Aunt        breast  . Cancer Paternal Aunt        esophageal, lung  . Cancer Maternal Aunt        breast  . Cancer Maternal Aunt        breast    Current Outpatient Medications on File Prior to  Visit  Medication Sig Dispense Refill  . albuterol (PROVENTIL HFA;VENTOLIN HFA) 108 (90 Base) MCG/ACT inhaler Inhale 2 puffs into the lungs every 6 (six) hours as needed for wheezing or shortness of breath.    Marland Kitchen amLODipine (NORVASC) 2.5 MG tablet Take 1 tablet by mouth daily.    Marland Kitchen atorvastatin (LIPITOR) 40 MG tablet Take 40 mg by mouth daily.    Marland Kitchen buPROPion (WELLBUTRIN XL) 150 MG 24 hr tablet Take 150 mg by mouth daily.    . clobetasol cream (TEMOVATE) 2.68 % Apply 1 application topically 2 (two) times daily. 30 g 3  . DULoxetine (CYMBALTA) 60 MG capsule Take 60 mg by mouth 2 (two) times daily.     . hydrocortisone (ANUSOL-HC) 25 MG suppository Place 1 suppository (25 mg total) rectally 2 (two) times daily as needed for hemorrhoids or anal itching. 12 suppository 3  . loratadine (CLARITIN) 10 MG tablet Take 10 mg by mouth daily.    Marland Kitchen losartan (COZAAR) 25 MG tablet Take 1 tablet by mouth daily.    . montelukast (SINGULAIR) 10 MG tablet Take 1 tablet by mouth daily.    Marland Kitchen omeprazole (PRILOSEC) 40 MG capsule Take 40 mg by mouth daily.    Marland Kitchen  Pramoxine HCl 0.5 % OINT Use prn for hemorrhoid pain 113 g 0  . QVAR REDIHALER 40 MCG/ACT inhaler Inhale into the lungs.    . topiramate (TOPAMAX) 50 MG tablet Take 50 mg by mouth at bedtime.   1   No current facility-administered medications on file prior to visit.    No Known Allergies  Social History   Substance and Sexual Activity  Alcohol Use Yes   Comment: occassionally    Social History   Tobacco Use  Smoking Status Never Smoker  Smokeless Tobacco Never Used    Review of Systems  Constitutional: Negative.   HENT: Negative.   Eyes: Negative.   Respiratory: Negative.   Cardiovascular: Negative.   Gastrointestinal: Negative.   Genitourinary: Negative.   Musculoskeletal: Negative.   Skin: Negative.   Neurological: Negative.   Endo/Heme/Allergies: Negative.   Psychiatric/Behavioral: Negative.     Objective   Vitals:   10/14/20  1053  BP: (!) 159/89  Pulse: 94  Resp: 12  Temp: 98.6 F (37 C)  SpO2: 96%    Physical Exam Vitals reviewed.  Constitutional:      Appearance: Normal appearance. She is not ill-appearing.  HENT:     Head: Normocephalic and atraumatic.  Cardiovascular:     Rate and Rhythm: Normal rate and regular rhythm.     Heart sounds: Normal heart sounds. No murmur heard. No friction rub. No gallop.   Pulmonary:     Effort: Pulmonary effort is normal. No respiratory distress.     Breath sounds: Normal breath sounds. No stridor. No wheezing, rhonchi or rales.  Abdominal:     General: Bowel sounds are normal. There is no distension.     Palpations: Abdomen is soft. There is no mass.     Tenderness: There is no abdominal tenderness. There is no guarding or rebound.     Hernia: No hernia is present.  Skin:    General: Skin is warm and dry.  Neurological:     Mental Status: She is alert and oriented to person, place, and time.    Previous colonoscopy report reviewed Assessment  Need for screening colonoscopy, immediate family history of colon cancer Plan   Patient is scheduled for a screening colonoscopy on 10/26/2020.  The risks and benefits of the procedure including bleeding and perforation were fully explained to the patient, who gave informed consent.  Sutabs have been prescribed for bowel preparation.

## 2020-10-14 NOTE — H&P (Signed)
Heidi Payne; [id number]; [birth year]   HPI Patient is a 55 year old white female who was referred to my care by Gar Ponto for a screening colonoscopy.  Patient last had a colonoscopy in 2013.  She her father did have colon cancer.  She denies any significant abdominal pain, blood in her stools, diarrhea, constipation, or significant weight loss. Past Medical History:  Diagnosis Date  . Anginal pain ([place])   . Asthma   . Depression    hitory of  . GERD (gastroesophageal reflux disease)   . Headache   . Hyperlipidemia   . Hypertension   . Kidney stones   . Mental disorder    depression.  . Sleep apnea   . Vitiligo     Past Surgical History:  Procedure Laterality Date  . APPENDECTOMY     1975  . CARDIAC CATHETERIZATION N/A 12/31/2015   Procedure: Left Heart Cath and Coronary Angiography;  Surgeon: Jettie Booze, MD;  Location: [facility];  Service: Cardiovascular;  Laterality: N/A;  . CESAREAN SECTION W/BTL  2005  . CHOLECYSTECTOMY  11/27/2011   Procedure: LAPAROSCOPIC CHOLECYSTECTOMY;  Surgeon: Jamesetta So, MD;  Location: [facility];  Service: General;  Laterality: N/A;  . COLONOSCOPY  03/19/2012   Procedure: COLONOSCOPY;  Surgeon: Jamesetta So, MD;  [facility];  Service: Gastroenterology;  Laterality: N/A;  . DILATION AND CURETTAGE OF UTERUS    . LAPAROSCOPIC SALPINGO OOPHERECTOMY Bilateral 01/22/2019   Procedure: LAPAROSCOPIC SALPINGO OOPHORECTOMY;  Surgeon: Florian Buff, MD;  Location: [facility];  Service: Gynecology;  Laterality: Bilateral;  . TUBAL LIGATION      Family History  Problem Relation Age of Onset  . Cancer Mother        bone brain cervical  . Cancer Father        colon  . Diabetes Father   . Hypertension Father   . Cancer Maternal Aunt        breast  . Cancer Paternal Aunt        esophageal, lung  . Cancer Maternal Aunt        breast  . Cancer Maternal Aunt        breast    Current Outpatient Medications on File Prior to  Visit  Medication Sig Dispense Refill  . albuterol (PROVENTIL HFA;VENTOLIN HFA) 108 (90 Base) MCG/ACT inhaler Inhale 2 puffs into the lungs every 6 (six) hours as needed for wheezing or shortness of breath.    Marland Kitchen amLODipine (NORVASC) 2.5 MG tablet Take 1 tablet by mouth daily.    Marland Kitchen atorvastatin (LIPITOR) 40 MG tablet Take 40 mg by mouth daily.    Marland Kitchen buPROPion (WELLBUTRIN XL) 150 MG 24 hr tablet Take 150 mg by mouth daily.    . clobetasol cream (TEMOVATE) 2.09 % Apply 1 application topically 2 (two) times daily. 30 g 3  . DULoxetine (CYMBALTA) 60 MG capsule Take 60 mg by mouth 2 (two) times daily.     . hydrocortisone (ANUSOL-HC) 25 MG suppository Place 1 suppository (25 mg total) rectally 2 (two) times daily as needed for hemorrhoids or anal itching. 12 suppository 3  . loratadine (CLARITIN) 10 MG tablet Take 10 mg by mouth daily.    Marland Kitchen losartan (COZAAR) 25 MG tablet Take 1 tablet by mouth daily.    . montelukast (SINGULAIR) 10 MG tablet Take 1 tablet by mouth daily.    Marland Kitchen omeprazole (PRILOSEC) 40 MG capsule Take 40 mg by mouth daily.    Marland Kitchen  Pramoxine HCl 0.5 % OINT Use prn for hemorrhoid pain 113 g 0  . QVAR REDIHALER 40 MCG/ACT inhaler Inhale into the lungs.    . topiramate (TOPAMAX) 50 MG tablet Take 50 mg by mouth at bedtime.   1   No current facility-administered medications on file prior to visit.    No Known Allergies  Social History   Substance and Sexual Activity  Alcohol Use Yes   Comment: occassionally    Social History   Tobacco Use  Smoking Status Never Smoker  Smokeless Tobacco Never Used    Review of Systems  Constitutional: Negative.   HENT: Negative.   Eyes: Negative.   Respiratory: Negative.   Cardiovascular: Negative.   Gastrointestinal: Negative.   Genitourinary: Negative.   Musculoskeletal: Negative.   Skin: Negative.   Neurological: Negative.   Endo/Heme/Allergies: Negative.   Psychiatric/Behavioral: Negative.     Objective   Vitals:   10/14/20  1053  BP: (!) 159/89  Pulse: 94  Resp: 12  Temp: 98.6 F (37 C)  SpO2: 96%    Physical Exam Vitals reviewed.  Constitutional:      Appearance: Normal appearance. She is not ill-appearing.  HENT:     Head: Normocephalic and atraumatic.  Cardiovascular:     Rate and Rhythm: Normal rate and regular rhythm.     Heart sounds: Normal heart sounds. No murmur heard. No friction rub. No gallop.   Pulmonary:     Effort: Pulmonary effort is normal. No respiratory distress.     Breath sounds: Normal breath sounds. No stridor. No wheezing, rhonchi or rales.  Abdominal:     General: Bowel sounds are normal. There is no distension.     Palpations: Abdomen is soft. There is no mass.     Tenderness: There is no abdominal tenderness. There is no guarding or rebound.     Hernia: No hernia is present.  Skin:    General: Skin is warm and dry.  Neurological:     Mental Status: She is alert and oriented to person, place, and time.    Previous colonoscopy report reviewed Assessment  Need for screening colonoscopy, immediate family history of colon cancer Plan   Patient is scheduled for a screening colonoscopy on 10/26/2020.  The risks and benefits of the procedure including bleeding and perforation were fully explained to the patient, who gave informed consent.  Sutabs have been prescribed for bowel preparation.

## 2020-10-26 ENCOUNTER — Encounter (HOSPITAL_COMMUNITY)
Admission: RE | Disposition: A | Discharge status: Home / Self Care | Payer: Self-pay | Source: Home / Self Care | Attending: General Surgery

## 2020-10-26 ENCOUNTER — Other Ambulatory Visit: Payer: Self-pay

## 2020-10-26 ENCOUNTER — Ambulatory Visit (HOSPITAL_COMMUNITY)
Admission: RE | Admit: 2020-10-26 | Discharge: 2020-10-26 | Disposition: A | Discharge status: Home / Self Care | Payer: No Typology Code available for payment source | Attending: General Surgery | Admitting: General Surgery

## 2020-10-26 DIAGNOSIS — Z801 Family history of malignant neoplasm of trachea, bronchus and lung: Secondary | ICD-10-CM | POA: Diagnosis not present

## 2020-10-26 DIAGNOSIS — Z8049 Family history of malignant neoplasm of other genital organs: Secondary | ICD-10-CM | POA: Diagnosis not present

## 2020-10-26 DIAGNOSIS — Z7951 Long term (current) use of inhaled steroids: Secondary | ICD-10-CM | POA: Diagnosis not present

## 2020-10-26 DIAGNOSIS — Z803 Family history of malignant neoplasm of breast: Secondary | ICD-10-CM | POA: Diagnosis not present

## 2020-10-26 DIAGNOSIS — J45909 Unspecified asthma, uncomplicated: Secondary | ICD-10-CM | POA: Insufficient documentation

## 2020-10-26 DIAGNOSIS — Z833 Family history of diabetes mellitus: Secondary | ICD-10-CM | POA: Diagnosis not present

## 2020-10-26 DIAGNOSIS — Z1211 Encounter for screening for malignant neoplasm of colon: Secondary | ICD-10-CM | POA: Insufficient documentation

## 2020-10-26 DIAGNOSIS — K219 Gastro-esophageal reflux disease without esophagitis: Secondary | ICD-10-CM | POA: Diagnosis not present

## 2020-10-26 DIAGNOSIS — Z8249 Family history of ischemic heart disease and other diseases of the circulatory system: Secondary | ICD-10-CM | POA: Insufficient documentation

## 2020-10-26 DIAGNOSIS — Z800 Family history of malignant neoplasm of digestive organs: Secondary | ICD-10-CM | POA: Diagnosis not present

## 2020-10-26 DIAGNOSIS — Z808 Family history of malignant neoplasm of other organs or systems: Secondary | ICD-10-CM | POA: Diagnosis not present

## 2020-10-26 HISTORY — PX: COLONOSCOPY: SHX5424

## 2020-10-26 SURGERY — COLONOSCOPY
Anesthesia: Moderate Sedation

## 2020-10-26 MED ORDER — MEPERIDINE HCL 50 MG/ML IJ SOLN
INTRAMUSCULAR | Status: DC | PRN
  Administered 2020-10-26: 25 mg
  Administered 2020-10-26: 50 mg

## 2020-10-26 MED ORDER — SODIUM CHLORIDE 0.9 % IV SOLN: INTRAVENOUS | Status: DC

## 2020-10-26 MED ORDER — MEPERIDINE HCL 100 MG/ML IJ SOLN
INTRAMUSCULAR | Status: AC
  Filled 2020-10-26: qty 1

## 2020-10-26 MED ORDER — MIDAZOLAM HCL 5 MG/5ML IJ SOLN
INTRAMUSCULAR | Status: AC
  Filled 2020-10-26: qty 5

## 2020-10-26 MED ORDER — MIDAZOLAM HCL 5 MG/5ML IJ SOLN
INTRAMUSCULAR | Status: DC | PRN
  Administered 2020-10-26: 1 mg via INTRAVENOUS
  Administered 2020-10-26: 3 mg via INTRAVENOUS
  Administered 2020-10-26: 1 mg via INTRAVENOUS

## 2020-10-26 NOTE — Op Note (Signed)
[facility] Patient Name: Heidi Payne Procedure Date: 10/26/2020 6:59 AM MRN: [id number] Date of Birth: [birth year] Attending MD: Aviva Signs , MD CSN: [id number] Age: 55 Admit Type: Outpatient Procedure:                Colonoscopy Indications:              Screening in patient at increased risk: Family                            history of 1st-degree relative with colorectal                            cancer Providers:                Aviva Signs, MD, Janeece Riggers, RN, Aram Candela Referring MD:              Medicines:                Midazolam 5 mg IV, Meperidine 75 mg IV Complications:            No immediate complications. Estimated Blood Loss:     Estimated blood loss: none. Procedure:                Pre-Anesthesia Assessment:                           - Prior to the procedure, a History and Physical                            was performed, and patient medications and                            allergies were reviewed. The patient is competent.                            The risks and benefits of the procedure and the                            sedation options and risks were discussed with the                            patient. All questions were answered and informed                            consent was obtained. Patient identification and                            proposed procedure were verified by the physician,                            the nurse and the technician in the endoscopy                            suite. Mental Status Examination: alert and  oriented. Airway Examination: normal oropharyngeal                            airway and neck mobility. Respiratory Examination:                            clear to auscultation. CV Examination: RRR, no                            murmurs, no S3 or S4. Prophylactic Antibiotics: The                            patient does not require prophylactic antibiotics.                             Prior Anticoagulants: The patient has taken no                            previous anticoagulant or antiplatelet agents. ASA                            Grade Assessment: II - A patient with mild systemic                            disease. After reviewing the risks and benefits,                            the patient was deemed in satisfactory condition to                            undergo the procedure. The anesthesia plan was to                            use moderate sedation / analgesia (conscious                            sedation). Immediately prior to administration of                            medications, the patient was re-assessed for                            adequacy to receive sedatives. The heart rate,                            respiratory rate, oxygen saturations, blood                            pressure, adequacy of pulmonary ventilation, and                            response to care were monitored throughout the  procedure. The physical status of the patient was                            re-assessed after the procedure.                           After obtaining informed consent, the colonoscope                            was passed under direct vision. Throughout the                            procedure, the patient's blood pressure, pulse, and                            oxygen saturations were monitored continuously. The                            CF-[id number] ([id number]) scope was introduced through                            the anus and advanced to the the cecum, identified                            by the appendiceal orifice, ileocecal valve and                            palpation. No anatomical landmarks were                            photographed. The entire colon was well visualized.                            The colonoscopy was performed without difficulty.                            The patient tolerated the procedure well. The                             quality of the bowel preparation was adequate. The                            total duration of the procedure was 15 minutes. Scope In: 7:23:01 AM Scope Out: 7:36:13 AM Scope Withdrawal Time: 0 hours 4 minutes 44 seconds  Total Procedure Duration: 0 hours 13 minutes 12 seconds  Findings:      The entire examined colon appeared normal on direct and retroflexion       views. Impression:               - The entire examined colon is normal on direct and                            retroflexion views.                           -  No specimens collected. Moderate Sedation:      Moderate (conscious) sedation was administered by the endoscopy nurse       and supervised by the endoscopist. The following parameters were       monitored: oxygen saturation, heart rate, blood pressure, and response       to care. Recommendation:           - Patient has a contact number available for                            emergencies. The signs and symptoms of potential                            delayed complications were discussed with the                            patient. Return to normal activities tomorrow.                            Written discharge instructions were provided to the                            patient.                           - Written discharge instructions were provided to                            the patient.                           - The signs and symptoms of potential delayed                            complications were discussed with the patient.                           - Patient has a contact number available for                            emergencies.                           - Return to normal activities tomorrow.                           - Resume previous diet.                           - Repeat colonoscopy in 5 years for screening                            purposes. Procedure Code(s):        --- Professional ---                            [phone], Colonoscopy, flexible; diagnostic, including  collection of specimen(s) by brushing or washing,                            when performed (separate procedure) Diagnosis Code(s):        --- Professional ---                           Z80.0, Family history of malignant neoplasm of                            digestive organs CPT copyright 2019 [employer]. All rights reserved. The codes documented in this report are preliminary and upon coder review may  be revised to meet current compliance requirements. Aviva Signs, MD Aviva Signs, MD 10/26/2020 7:40:33 AM This report has been signed electronically. Number of Addenda: 0

## 2020-10-26 NOTE — Interval H&P Note (Signed)
History and Physical Interval Note:  10/26/2020 7:17 AM  Heidi Payne  has presented today for surgery, with the diagnosis of Screening.  The various methods of treatment have been discussed with the patient and family. After consideration of risks, benefits and other options for treatment, the patient has consented to  Procedure(s): COLONOSCOPY (N/A) as a surgical intervention.  The patient's history has been reviewed, patient examined, no change in status, stable for surgery.  I have reviewed the patient's chart and labs.  Questions were answered to the patient's satisfaction.     Aviva Signs

## 2020-11-02 ENCOUNTER — Encounter (HOSPITAL_COMMUNITY): Payer: Self-pay | Admitting: General Surgery

## 2021-01-17 DIAGNOSIS — Z0289 Encounter for other administrative examinations: Secondary | ICD-10-CM

## 2021-01-27 ENCOUNTER — Ambulatory Visit (INDEPENDENT_AMBULATORY_CARE_PROVIDER_SITE_OTHER): Payer: No Typology Code available for payment source | Admitting: Family Medicine

## 2021-01-27 ENCOUNTER — Other Ambulatory Visit: Payer: Self-pay

## 2021-01-27 ENCOUNTER — Encounter (INDEPENDENT_AMBULATORY_CARE_PROVIDER_SITE_OTHER): Payer: Self-pay | Admitting: Family Medicine

## 2021-01-27 VITALS — BP 130/85 | HR 96 | Temp 97.4°F | Ht 66.0 in | Wt 215.0 lb

## 2021-01-27 DIAGNOSIS — G4733 Obstructive sleep apnea (adult) (pediatric): Secondary | ICD-10-CM

## 2021-01-27 DIAGNOSIS — R0602 Shortness of breath: Secondary | ICD-10-CM

## 2021-01-27 DIAGNOSIS — E7849 Other hyperlipidemia: Secondary | ICD-10-CM | POA: Diagnosis not present

## 2021-01-27 DIAGNOSIS — Z9189 Other specified personal risk factors, not elsewhere classified: Secondary | ICD-10-CM | POA: Diagnosis not present

## 2021-01-27 DIAGNOSIS — Z6834 Body mass index (BMI) 34.0-34.9, adult: Secondary | ICD-10-CM

## 2021-01-27 DIAGNOSIS — R7303 Prediabetes: Secondary | ICD-10-CM | POA: Diagnosis not present

## 2021-01-27 DIAGNOSIS — I10 Essential (primary) hypertension: Secondary | ICD-10-CM | POA: Diagnosis not present

## 2021-01-27 DIAGNOSIS — E669 Obesity, unspecified: Secondary | ICD-10-CM

## 2021-01-27 DIAGNOSIS — E559 Vitamin D deficiency, unspecified: Secondary | ICD-10-CM | POA: Diagnosis not present

## 2021-01-27 DIAGNOSIS — R5383 Other fatigue: Secondary | ICD-10-CM | POA: Diagnosis not present

## 2021-01-27 DIAGNOSIS — F32A Depression, unspecified: Secondary | ICD-10-CM | POA: Diagnosis not present

## 2021-01-27 DIAGNOSIS — F419 Anxiety disorder, unspecified: Secondary | ICD-10-CM | POA: Diagnosis not present

## 2021-01-27 DIAGNOSIS — K219 Gastro-esophageal reflux disease without esophagitis: Secondary | ICD-10-CM

## 2021-01-27 NOTE — Progress Notes (Signed)
Dear Dr. Quillian Quince,   Thank you for referring Heidi Payne to our clinic. The following note includes my evaluation and treatment recommendations.  Chief Complaint:   OBESITY Heidi Payne (MR# [id number]) is a 55 y.o. female who presents for evaluation and treatment of obesity and related comorbidities. Current BMI is Body mass index is 34.7 kg/m. Heidi Payne has been struggling with her weight for many years and has been unsuccessful in either losing weight, maintaining weight loss, or reaching her healthy weight goal.  Zyon is currently in the action stage of change and ready to dedicate time achieving and maintaining a healthier weight. Heidi Payne is interested in becoming our patient and working on intensive lifestyle modifications including (but not limited to) diet and exercise for weight loss.  Referred by Dr. Gar Ponto. 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 She skips breakfast 3 times a week due to demands at work. Coffee with 2 tbsp creamer + 2 packets Splenda; May have 15 minute breakfast ~ 8:30-9 another cup of coffee, maybe toast, oatmeal, or cereal (1 cup) (Lucky Charms or Cinnamon Toast Crunch); Lunch- PB&J or leftovers or tuna sandwich or potted meat (satisfied); Dinner- Can of spaghetti + meatballs or another sandwich or grilled chicken or 1 cup mac n cheese and 1 cup green beans; After dinner- popcorn or 3 oz chips or cookies.  Heidi Payne's habits were reviewed today and are as follows: she thinks her family will eat healthier with her, her desired weight loss is 50 lbs, she started gaining weight over the last year, her heaviest weight ever was 220 pounds, she has significant food cravings issues, she skips meals frequently, she is frequently drinking liquids with calories, she frequently makes poor food choices, she frequently eats larger portions than normal, she has binge eating behaviors, and she struggles with emotional eating.  Depression Screen Heidi Payne's  Food and Mood (modified PHQ-9) score was 9.  Depression screen PHQ 2/9 01/27/2021  Decreased Interest 1  Down, Depressed, Hopeless 1  PHQ - 2 Score 2  Altered sleeping 0  Tired, decreased energy 2  Change in appetite 3  Feeling bad or failure about yourself  1  Trouble concentrating 1  Moving slowly or fidgety/restless 0  Suicidal thoughts 0  PHQ-9 Score 9  Difficult doing work/chores Not difficult at all   Subjective:   1. Other fatigue Pollyann admits to daytime somnolence and admits to waking up still tired. Patent has a history of symptoms of daytime fatigue and morning fatigue. Heidi Payne generally gets 6 or 7 hours of sleep per night, [name]  2. SOB (shortness of breath) Heidi Payne notes increasing shortness of breath with exercising and seems to be worsening over time with weight gain. She notes getting out of breath sooner with activity than she used to. This has gotten worse recently. Heidi Payne denies shortness of breath at rest or orthopnea.  3. Essential hypertension Diagnosed within the last 6-9 months. Pt is on Cozaar and amlodipine.  4. Other hyperlipidemia Diagnosed years ago. Heidi Payne is on statin therapy (minimally elevated ALT). She denies myalgias.  5. Gastroesophageal reflux disease, unspecified whether esophagitis present Symptoms are well managed on omeprazole.  6. OSA (obstructive sleep apnea) Heidi Payne has a CPAP but may not wear it 1-2 times a week.  7. Anxiety and depression Pt is on Cymbalta and bupropion. Her symptoms are well managed.  Pt denies suicidal or homicidal ideations.  8. Pre-diabetes Heidi Payne's last A1c was 6.0. no insulin level on file.  9. Vitamin D deficiency Unknown last level. Pt is not on a Vit D supplement.  10. At risk for heart disease Heidi Payne is at a higher than average risk for cardiovascular disease due to  obesity.   Assessment/Plan:   1. Other fatigue Heidi Payne does feel that her weight is causing her energy to be lower than it should be. Fatigue may be related to obesity, depression or many other causes. Labs will be ordered, and in the meanwhile, Heidi Payne will focus on self care including making healthy food choices, increasing physical activity and focusing on stress reduction. Check labs today.  - EKG 12-Lead - Vitamin B12 - Folate - T3 - T4, free - TSH  2. SOB (shortness of breath) Heidi Payne does feel that she gets out of breath more easily that she used to when she exercises. Heidi Payne's shortness of breath appears to be obesity related and exercise induced. She has agreed to work on weight loss and gradually increase exercise to treat her exercise induced shortness of breath. Will continue to monitor closely. Check labs today.  - CBC with Differential/Platelet  3. Essential hypertension Heidi Payne is working on healthy weight loss and exercise to improve blood pressure control. We will watch for signs of hypotension as she continues her lifestyle modifications. Check labs today.  - Comprehensive metabolic panel  4. Other hyperlipidemia Cardiovascular risk and specific lipid/LDL goals reviewed.  We discussed several lifestyle modifications today and Heidi Payne will continue to work on diet, exercise and weight loss efforts. Orders and follow up as documented in patient record.   Counseling Intensive lifestyle modifications are the first line treatment for this issue. Dietary changes: Increase soluble fiber. Decrease simple carbohydrates. Exercise changes: Moderate to vigorous-intensity aerobic activity 150 minutes per week if tolerated. Lipid-lowering medications: see documented in medical record. Check labs today.  - Lipid Panel With LDL/HDL Ratio  5. Gastroesophageal reflux disease, unspecified whether esophagitis present Intensive lifestyle modifications are the first line  treatment for this issue. We discussed several lifestyle modifications today and she will continue to work on diet, exercise and weight loss efforts. Orders and follow up as documented in patient record. Follow up on symptoms at next appt.  Counseling If a person has gastroesophageal reflux disease (GERD), food and stomach acid move back up into the esophagus and cause symptoms or problems such as damage to the esophagus. Anti-reflux measures include: raising the head of the bed, avoiding tight clothing or belts, avoiding eating late at night, not lying down shortly after mealtime, and achieving weight loss. Avoid ASA, NSAID's, caffeine, alcohol, and tobacco.  OTC Pepcid and/or Tums are often very helpful for as needed use.  However, for persisting chronic or daily symptoms, stronger medications like Omeprazole may be needed. You may need to avoid foods and drinks such as: Coffee and tea (with or without caffeine). Drinks that contain alcohol. Energy drinks and sports drinks. Bubbly (carbonated) drinks or sodas. Chocolate and cocoa. Peppermint and mint flavorings. Garlic and onions. Horseradish. Spicy and acidic foods. These include peppers, chili powder, curry powder, vinegar, hot sauces, and BBQ sauce. Citrus fruit juices and citrus fruits, such as oranges, lemons, and limes. Tomato-based foods. These include red sauce, chili, salsa, and pizza with red sauce. Fried and fatty foods. These include donuts, french fries, potato chips, and high-fat dressings. High-fat meats. These include hot dogs, rib eye steak, sausage, ham, and  bacon.  6. OSA (obstructive sleep apnea) Intensive lifestyle modifications are the first line treatment for this issue. We discussed several lifestyle modifications today and she will continue to work on diet, exercise and weight loss efforts. We will continue to monitor. Orders and follow up as documented in patient record. Follow up on compliance at next appt.  7.  Anxiety and depression Behavior modification techniques were discussed today to help Kassy deal with her anxiety.  Orders and follow up as documented in patient record. Follow up at next appt. Refer to Dr. Mallie Mussel.  8. Pre-diabetes Mariam will continue to work on weight loss, exercise, and decreasing simple carbohydrates to help decrease the risk of diabetes.  Check labs today.  - Hemoglobin A1c - Insulin, random  9. Vitamin D deficiency Low Vitamin D level contributes to fatigue and are associated with obesity, breast, and colon cancer. She will follow-up for routine testing of Vitamin D, at least 2-3 times per year to avoid over-replacement. Check labs today.  - VITAMIN D 25 Hydroxy (Vit-D Deficiency, Fractures)  10. At risk for heart disease Charonda was given approximately 15 minutes of coronary artery disease prevention counseling today. She is 55 y.o. female and has risk factors for heart disease including obesity. We discussed intensive lifestyle modifications today with an emphasis on specific weight loss instructions and strategies.   Repetitive spaced learning was employed today to elicit superior memory formation and behavioral change.  11. Obesity with current BMI of 34.8  Asyria is currently in the action stage of change and her goal is to continue with weight loss efforts. I recommend Juliyah begin the structured treatment plan as follows:  She has agreed to the Category 3 Plan.  Exercise goals: No exercise has been prescribed at this time.   Behavioral modification strategies: increasing lean protein intake, no skipping meals, meal planning and cooking strategies, and keeping healthy foods in the home.  She was informed of the importance of frequent follow-up visits to maximize her success with intensive lifestyle modifications for her multiple health conditions. She was informed we would discuss her lab results at her next visit unless there is a critical issue  that needs to be addressed sooner. Akshaya agreed to keep her next visit at the agreed upon time to discuss these results.  Objective:   Blood pressure 130/85, pulse 96, temperature (!) 97.4 F (36.3 C), height _0  (1.676 m), weight 215 lb (97.5 kg), last menstrual period 12/12/2018, SpO2 99 %. Body mass index is 34.7 kg/m.  EKG: Normal sinus rhythm, rate 99- erroneously labeled a. flutter.  Indirect Calorimeter completed today shows a VO2 of 299 and a REE of 2059.  Her calculated basal metabolic rate is 1610 thus her basal metabolic rate is better than expected.  General: Cooperative, alert, well developed, in no acute distress. HEENT: Conjunctivae and lids unremarkable. Cardiovascular: Regular rhythm.  Lungs: Normal work of breathing. Neurologic: No focal deficits.   Lab Results  Component Value Date   CREATININE 0.73 01/21/2019   BUN 13 01/21/2019   NA 139 01/21/2019   K 3.4 (L) 01/21/2019   CL 107 01/21/2019   CO2 22 01/21/2019   Lab Results  Component Value Date   ALT 24 01/21/2019   AST 20 01/21/2019   ALKPHOS 84 01/21/2019   BILITOT 0.7 01/21/2019   No results found for: HGBA1C No results found for: INSULIN Lab Results  Component Value Date   TSH 1.230 12/31/2014   Lab Results  Component  Value Date   CHOL 213 (H) 12/30/2015   HDL 38 (L) 12/30/2015   LDLCALC 142 (H) 12/30/2015   TRIG 164 (H) 12/30/2015   CHOLHDL 5.6 12/30/2015   Lab Results  Component Value Date   WBC 5.5 01/21/2019   HGB 12.4 01/21/2019   HCT 41.0 01/21/2019   MCV 81.2 01/21/2019   PLT 312 01/21/2019    Attestation Statements:   Reviewed by clinician on day of visit: allergies, medications, problem list, medical history, surgical history, family history, social history, and previous encounter notes.  Coral Ceo, CMA, am acting as transcriptionist for Coralie Common, MD.  This is the patient's first visit at [facility]. The patient's NEW PATIENT PACKET  was reviewed at length. Included in the packet: current and past health history, medications, allergies, ROS, gynecologic history (women only), surgical history, family history, social history, weight history, weight loss surgery history (for those that have had weight loss surgery), nutritional evaluation, mood and food questionnaire, PHQ9, Epworth questionnaire, sleep habits questionnaire, patient life and health improvement goals questionnaire. These will all be scanned into the patient's chart under media.   During the visit, I independently reviewed the patient's EKG, bioimpedance scale results, and indirect calorimeter results. I used this information to tailor a meal plan for the patient that will help her to lose weight and will improve her obesity-related conditions going forward. I performed a medically necessary appropriate examination and/or evaluation. I discussed the assessment and treatment plan with the patient. The patient was provided an opportunity to ask questions and all were answered. The patient agreed with the plan and demonstrated an understanding of the instructions. Labs were ordered at this visit and will be reviewed at the next visit unless more critical results need to be addressed immediately. Clinical information was updated and documented in the EMR.   Time spent on visit including pre-visit chart review and post-visit care was 60 minutes.   A separate 15 minutes was spent on risk counseling (see above).   I have reviewed the above documentation for accuracy and completeness, and I agree with the above. - Coralie Common, MD

## 2021-01-28 LAB — VITAMIN D 25 HYDROXY (VIT D DEFICIENCY, FRACTURES): Vit D, 25-Hydroxy: 26.2 ng/mL — ABNORMAL LOW (ref 30.0–100.0)

## 2021-01-28 LAB — COMPREHENSIVE METABOLIC PANEL
ALT: 44 IU/L — ABNORMAL HIGH (ref 0–32)
AST: 29 IU/L (ref 0–40)
Albumin/Globulin Ratio: 2.1 (ref 1.2–2.2)
Albumin: 4.6 g/dL (ref 3.8–4.9)
Alkaline Phosphatase: 130 IU/L — ABNORMAL HIGH (ref 44–121)
BUN/Creatinine Ratio: 17 (ref 9–23)
BUN: 13 mg/dL (ref 6–24)
Bilirubin Total: 0.4 mg/dL (ref 0.0–1.2)
CO2: 25 mmol/L (ref 20–29)
Calcium: 9.8 mg/dL (ref 8.7–10.2)
Chloride: 103 mmol/L (ref 96–106)
Creatinine, Ser: 0.76 mg/dL (ref 0.57–1.00)
Globulin, Total: 2.2 g/dL (ref 1.5–4.5)
Glucose: 99 mg/dL (ref 65–99)
Potassium: 4 mmol/L (ref 3.5–5.2)
Sodium: 142 mmol/L (ref 134–144)
Total Protein: 6.8 g/dL (ref 6.0–8.5)
eGFR: 92 mL/min/{1.73_m2} (ref >59)

## 2021-01-28 LAB — LIPID PANEL WITH LDL/HDL RATIO
Cholesterol, Total: 183 mg/dL (ref 100–199)
HDL: 52 mg/dL (ref >39)
LDL Chol Calc (NIH): 103 mg/dL — ABNORMAL HIGH (ref 0–99)
LDL/HDL Ratio: 2 ratio (ref 0.0–3.2)
Triglycerides: 158 mg/dL — ABNORMAL HIGH (ref 0–149)
VLDL Cholesterol Cal: 28 mg/dL (ref 5–40)

## 2021-01-28 LAB — CBC WITH DIFFERENTIAL/PLATELET
Basophils Absolute: 0 10*3/uL (ref 0.0–0.2)
Basos: 1 %
EOS (ABSOLUTE): 0.2 10*3/uL (ref 0.0–0.4)
Eos: 3 %
Hematocrit: 42.9 % (ref 34.0–46.6)
Hemoglobin: 14.2 g/dL (ref 11.1–15.9)
Immature Grans (Abs): 0 10*3/uL (ref 0.0–0.1)
Immature Granulocytes: 0 %
Lymphocytes Absolute: 1.4 10*3/uL (ref 0.7–3.1)
Lymphs: 23 %
MCH: 27.7 pg (ref 26.6–33.0)
MCHC: 33.1 g/dL (ref 31.5–35.7)
MCV: 84 fL (ref 79–97)
Monocytes Absolute: 0.4 10*3/uL (ref 0.1–0.9)
Monocytes: 7 %
Neutrophils Absolute: 4.1 10*3/uL (ref 1.4–7.0)
Neutrophils: 66 %
Platelets: 296 10*3/uL (ref 150–450)
RBC: 5.12 x10E6/uL (ref 3.77–5.28)
RDW: 13.9 % (ref 11.7–15.4)
WBC: 6.2 10*3/uL (ref 3.4–10.8)

## 2021-01-28 LAB — T4, FREE: Free T4: 0.99 ng/dL (ref 0.82–1.77)

## 2021-01-28 LAB — FOLATE: Folate: 13.2 ng/mL (ref >3.0)

## 2021-01-28 LAB — VITAMIN B12: Vitamin B-12: 542 pg/mL (ref 232–1245)

## 2021-01-28 LAB — T3: T3, Total: 129 ng/dL (ref 71–180)

## 2021-01-28 LAB — HEMOGLOBIN A1C
Est. average glucose Bld gHb Est-mCnc: 128 mg/dL
Hgb A1c MFr Bld: 6.1 % — ABNORMAL HIGH (ref 4.8–5.6)

## 2021-01-28 LAB — INSULIN, RANDOM: INSULIN: 26.6 u[IU]/mL — ABNORMAL HIGH (ref 2.6–24.9)

## 2021-01-28 LAB — TSH: TSH: 1.11 u[IU]/mL (ref 0.450–4.500)
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
## 2021-02-10 ENCOUNTER — Other Ambulatory Visit: Payer: Self-pay

## 2021-02-10 ENCOUNTER — Other Ambulatory Visit (INDEPENDENT_AMBULATORY_CARE_PROVIDER_SITE_OTHER): Payer: Self-pay | Admitting: Family Medicine

## 2021-02-10 ENCOUNTER — Ambulatory Visit (INDEPENDENT_AMBULATORY_CARE_PROVIDER_SITE_OTHER): Payer: No Typology Code available for payment source | Admitting: Family Medicine

## 2021-02-10 ENCOUNTER — Encounter (INDEPENDENT_AMBULATORY_CARE_PROVIDER_SITE_OTHER): Payer: Self-pay | Admitting: Family Medicine

## 2021-02-10 VITALS — BP 127/86 | HR 88 | Temp 97.8°F | Ht 66.0 in | Wt 209.0 lb

## 2021-02-10 DIAGNOSIS — R7303 Prediabetes: Secondary | ICD-10-CM

## 2021-02-10 DIAGNOSIS — Z9189 Other specified personal risk factors, not elsewhere classified: Secondary | ICD-10-CM

## 2021-02-10 DIAGNOSIS — I10 Essential (primary) hypertension: Secondary | ICD-10-CM | POA: Diagnosis not present

## 2021-02-10 DIAGNOSIS — E669 Obesity, unspecified: Secondary | ICD-10-CM

## 2021-02-10 DIAGNOSIS — Z6834 Body mass index (BMI) 34.0-34.9, adult: Secondary | ICD-10-CM

## 2021-02-10 DIAGNOSIS — R7401 Elevation of levels of liver transaminase levels: Secondary | ICD-10-CM

## 2021-02-10 DIAGNOSIS — E559 Vitamin D deficiency, unspecified: Secondary | ICD-10-CM | POA: Diagnosis not present

## 2021-02-10 DIAGNOSIS — E7849 Other hyperlipidemia: Secondary | ICD-10-CM | POA: Diagnosis not present

## 2021-02-10 MED ORDER — TIRZEPATIDE 2.5 MG/0.5ML ~~LOC~~ SOAJ: 2.5000 mg | SUBCUTANEOUS | 0 refills | Status: DC

## 2021-02-10 MED ORDER — VITAMIN D (ERGOCALCIFEROL) 1.25 MG (50000 UNIT) PO CAPS: 50000.0000 [IU] | ORAL_CAPSULE | ORAL | 0 refills | Status: DC

## 2021-02-10 NOTE — Progress Notes (Signed)
Chief Complaint:   OBESITY Heidi Payne is here to discuss her progress with her obesity treatment plan along with follow-up of her obesity related diagnoses. Heidi Payne is on the Category 3 Plan and states she is following her eating plan approximately 95% of the time. Heidi Payne states she is not currently exercising.  Today's visit was #: 2 Starting weight: 215 lbs Starting date: 01/27/2021 Today's weight: 209 lbs Today's date: 02/10/2021 Total lbs lost to date: 6 Total lbs lost since last in-office visit: 6  Interim History: Heidi Payne has been doing really well over the last few weeks. She notes following the meal plan is easier than expected. She is getting in at least 6 oz at dinner. Pt is doing a sandwich at lunch . She is forcing herself to eat breakfast during her 15 minute break in the morning. She is prepping eggs in the evenings. Her daughter is also very engaged in the plan.  Subjective:   1. Essential hypertension BP well controlled. Pt denies chest pain/chest pressure/headache. She is on losartan and Norvasc.  2. Transaminitis Discussed labs with patient today. Heidi Payne's alkaline phosphate and ALT are slightly elevated. No ultrasound on file.  3. Other hyperlipidemia Discussed labs with patient today. She is on Lipitor 40 mg and denies myalgias (slight transaminitis). Pt's triglycerides and LDL are both slightly elevated.  4. Vitamin D deficiency New. Discussed labs with patient today. Heidi Payne is not on prescription Vit D or OTC Vit D and reports fatigue.  5. Pre-diabetes Discussed labs with patient today. Pt's A1c is 6.1 and insulin level 26.6. She reports minimal drive for carbs. Pt is concerned about A1c elevation.  6. At risk of diabetes mellitus Idania is at higher than average risk for developing diabetes due to obesity.   Assessment/Plan:   1. Essential hypertension Tomeka is working on healthy weight loss and exercise to improve blood pressure  control. We will watch for signs of hypotension as she continues her lifestyle modifications. Continue current treatment plan.  2. Transaminitis Repeat labs in 3 months.  3. Other hyperlipidemia Cardiovascular risk and specific lipid/LDL goals reviewed.  We discussed several lifestyle modifications today and Heidi Payne will continue to work on diet, exercise and weight loss efforts. Orders and follow up as documented in patient record. Repeat labs in 3 months. Continue Lipitor.  Counseling Intensive lifestyle modifications are the first line treatment for this issue. Dietary changes: Increase soluble fiber. Decrease simple carbohydrates. Exercise changes: Moderate to vigorous-intensity aerobic activity 150 minutes per week if tolerated. Lipid-lowering medications: see documented in medical record.  4. Vitamin D deficiency Low Vitamin D level contributes to fatigue and are associated with obesity, breast, and colon cancer. She agrees to start prescription Vitamin D 50,000 IU every week and will follow-up for routine testing of Vitamin D, at least 2-3 times per year to avoid over-replacement.  Start- Vitamin D, Ergocalciferol, (DRISDOL) 1.25 MG (50000 UNIT) CAPS capsule; Take 1 capsule (50,000 Units total) by mouth every 7 (seven) days.  Dispense: 4 capsule; Refill: 0  5. Pre-diabetes Heidi Payne will start Mounjaro 2.5 mg and continue to work on weight loss, exercise, and decreasing simple carbohydrates to help decrease the risk of diabetes.   Start- tirzepatide [facility]) 2.5 MG/0.5ML Pen; Inject 2.5 mg into the skin once a week.  Dispense: 2 mL; Refill: 0  6. At risk of diabetes mellitus Heidi Payne was given approximately 30 minutes of diabetes education and counseling today. We discussed intensive lifestyle modifications today with an emphasis on  weight loss as well as increasing exercise and decreasing simple carbohydrates in her diet. We also reviewed medication options with an emphasis on risk  versus benefit of those discussed.   Repetitive spaced learning was employed today to elicit superior memory formation and behavioral change.  7. Obesity with current BMI of 33.8  Heidi Payne is currently in the action stage of change. As such, her goal is to continue with weight loss efforts. She has agreed to the Category 3 Plan.   Exercise goals: No exercise has been prescribed at this time.  Behavioral modification strategies: increasing lean protein intake, meal planning and cooking strategies, better snacking choices, and planning for success.  Luva has agreed to follow-up with our clinic in 2-3 weeks. She was informed of the importance of frequent follow-up visits to maximize her success with intensive lifestyle modifications for her multiple health conditions.   Objective:   Blood pressure 127/86, pulse 88, temperature 97.8 F (36.6 C), height 5\' 6"  (1.676 m), weight 209 lb (94.8 kg), last menstrual period 12/12/2018, SpO2 99 %. Body mass index is 33.73 kg/m.  General: Cooperative, alert, well developed, in no acute distress. HEENT: Conjunctivae and lids unremarkable. Cardiovascular: Regular rhythm.  Lungs: Normal work of breathing. Neurologic: No focal deficits.   Lab Results  Component Value Date   CREATININE 0.76 01/27/2021   BUN 13 01/27/2021   NA 142 01/27/2021   K 4.0 01/27/2021   CL 103 01/27/2021   CO2 25 01/27/2021   Lab Results  Component Value Date   ALT 44 (H) 01/27/2021   AST 29 01/27/2021   ALKPHOS 130 (H) 01/27/2021   BILITOT 0.4 01/27/2021   Lab Results  Component Value Date   HGBA1C 6.1 (H) 01/27/2021   Lab Results  Component Value Date   INSULIN 26.6 (H) 01/27/2021   Lab Results  Component Value Date   TSH 1.110 01/27/2021   Lab Results  Component Value Date   CHOL 183 01/27/2021   HDL 52 01/27/2021   LDLCALC 103 (H) 01/27/2021   TRIG 158 (H) 01/27/2021   CHOLHDL 5.6 12/30/2015   Lab Results  Component Value Date   VD25OH  26.2 (L) 01/27/2021   Lab Results  Component Value Date   WBC 6.2 01/27/2021   HGB 14.2 01/27/2021   HCT 42.9 01/27/2021   MCV 84 01/27/2021   PLT 296 01/27/2021    Attestation Statements:   Reviewed by clinician on day of visit: allergies, medications, problem list, medical history, surgical history, family history, social history, and previous encounter notes.  Coral Ceo, CMA, am acting as transcriptionist for Coralie Common, MD.   I have reviewed the above documentation for accuracy and completeness, and I agree with the above. - Coralie Common, MD

## 2021-02-10 NOTE — Telephone Encounter (Signed)
Dr.Ukleja 

## 2021-02-15 ENCOUNTER — Telehealth (INDEPENDENT_AMBULATORY_CARE_PROVIDER_SITE_OTHER): Payer: No Typology Code available for payment source | Admitting: Psychology

## 2021-02-15 DIAGNOSIS — F419 Anxiety disorder, unspecified: Secondary | ICD-10-CM | POA: Diagnosis not present

## 2021-02-15 DIAGNOSIS — F5089 Other specified eating disorder: Secondary | ICD-10-CM

## 2021-02-22 NOTE — Progress Notes (Signed)
Office: [phone]  /  Fax: [phone]    Date: March 08, 2021   Appointment Start Time: 3:02pm Duration: 27 minutes Provider: Glennie Isle, Psy.D. Type of Session: Individual Therapy  Location of Patient: Home (private room) Location of Provider: Provider's Home (private office) Type of Contact: Telepsychological Visit via MyChart Video Visit  Session Content: Heidi Payne is a 55 y.o. female presenting for a follow-up appointment to address the previously established treatment goal of increasing coping skills.Today's appointment was a telepsychological visit due to COVID-19. Heidi Payne provided verbal consent for today's telepsychological appointment and she is aware she is responsible for securing confidentiality on her end of the session. Prior to proceeding with today's appointment, Heidi Payne's physical location at the time of this appointment was obtained as well a phone number she could be reached at in the event of technical difficulties. Heidi Payne and this provider participated in today's telepsychological service.   This provider conducted a brief check-in. Heidi Payne indicated she met with Dr. Michail Sermon with [facility] for an initial appointment, noting their next appointment is on Friday. A risk assessment was completed. Heidi Payne denied experiencing suicidal and homicidal ideation, plan, and intent since the last appointment with this provider. She reported she continues to have easy access to the developed safety plan, and continues to acknowledge understanding regarding the importance of reaching out to trusted individuals and/or emergency resources if she is unable to ensure safety.   Reviewed physical and emotional hunger. Heidi Payne indicated, "I've actually been doing good." She discussed making better choices and engaging in portion control during a birthday. Processed associated thoughts/feelings as she discussed feeling guilty. Psychoeducation regarding triggers for  emotional eating was provided. Heidi Payne was provided a handout, and encouraged to utilize the handout between now and the next appointment to increase awareness of triggers and frequency. Heidi Payne agreed. This provider also discussed behavioral strategies for specific triggers. Heidi Payne provided verbal consent during today's appointment for this provider to send a handout about triggers via e-mail. Overall, Heidi Payne was receptive to today's appointment as evidenced by openness to sharing, responsiveness to feedback, and willingness to explore triggers for emotional eating.  Mental Status Examination:  Appearance: well groomed and appropriate hygiene  Behavior: appropriate to circumstances Mood: euthymic Affect: mood congruent Speech: normal in rate, volume, and tone Eye Contact: appropriate Psychomotor Activity: appropriate Gait: unable to assess Thought Process: linear, logical, and goal directed  Thought Content/Perception: denies suicidal and homicidal ideation, plan, and intent, no hallucinations, delusions, bizarre thinking or behavior reported or observed, and denies ideation and engagement in self-injurious behaviors Orientation: time, person, place, and purpose of appointment Memory/Concentration: memory, attention, language, and fund of knowledge intact  Insight/Judgment: fair  Interventions:  Conducted a brief chart review Conducted a risk assessment Provided empathic reflections and validation Reviewed content from the previous session Employed supportive psychotherapy interventions to facilitate reduced distress and to improve coping skills with identified stressors Psychoeducation provided regarding triggers for emotional eating  DSM-5 Diagnosis(es): F50.89 Other Specified Feeding or Eating Disorder, Emotional Eating Behaviors and F41.9 Unspecified Anxiety Disorder  Treatment Goal & Progress: During the initial appointment with this provider, the following treatment goal was  established: increase coping skills. Progress is limited, as Heidi Payne has just begun treatment with this provider; however, she is receptive to the interaction and interventions and rapport is being established.   Plan: The next appointment will be scheduled in three weeks, which will be via MyChart Video Visit. The next session will focus on working towards the established treatment  goal. Jannis will continue meeting with her primary therapist.

## 2021-03-02 ENCOUNTER — Ambulatory Visit (INDEPENDENT_AMBULATORY_CARE_PROVIDER_SITE_OTHER): Payer: No Typology Code available for payment source | Admitting: Family Medicine

## 2021-03-04 ENCOUNTER — Ambulatory Visit (INDEPENDENT_AMBULATORY_CARE_PROVIDER_SITE_OTHER): Payer: No Typology Code available for payment source | Admitting: Psychologist

## 2021-03-04 DIAGNOSIS — F330 Major depressive disorder, recurrent, mild: Secondary | ICD-10-CM | POA: Diagnosis not present

## 2021-03-04 DIAGNOSIS — F411 Generalized anxiety disorder: Secondary | ICD-10-CM

## 2021-03-06 ENCOUNTER — Other Ambulatory Visit (INDEPENDENT_AMBULATORY_CARE_PROVIDER_SITE_OTHER): Payer: Self-pay | Admitting: Family Medicine

## 2021-03-06 DIAGNOSIS — R7303 Prediabetes: Secondary | ICD-10-CM

## 2021-03-07 ENCOUNTER — Encounter (INDEPENDENT_AMBULATORY_CARE_PROVIDER_SITE_OTHER): Payer: Self-pay

## 2021-03-07 NOTE — Telephone Encounter (Signed)
Message sent to pt-CAS 

## 2021-03-07 NOTE — Telephone Encounter (Signed)
LAST APPOINTMENT DATE9/29/22 NEXT APPOINTMENT DATE: 03/16/21   [id number] [place] [place] [address] [place] [address] Phone: [phone] Fax: [phone]  [id number] - [address] [place] [address] Phone: [phone] Fax: [phone]  Patient is requesting a refill of the following medications: Pending Prescriptions:                       Disp   Refills   MOUNJARO 2.5 MG/0.5ML Pen [Pharmacy Med Na*               Sig: INJECT 2.5 MG INTO THE SKIN ONCE A WEEK.   Date last filled: 02/10/21 Previously prescribed by Dr Jearld Shines  Lab Results      Component                Value               Date                      HGBA1C                   6.1 (H)             01/27/2021           Lab Results      Component                Value               Date                      LDLCALC                  103 (H)             01/27/2021                CREATININE               0.76                01/27/2021           Lab Results      Component                Value               Date                      VD25OH                   26.2 (L)            01/27/2021            BP Readings from Last 3 Encounters: 02/10/21 : 127/86 01/27/21 : 130/85 10/26/20 : (!) 110/94

## 2021-03-07 NOTE — Telephone Encounter (Signed)
Last OV with Dr Ukleja 

## 2021-03-08 ENCOUNTER — Telehealth (INDEPENDENT_AMBULATORY_CARE_PROVIDER_SITE_OTHER): Payer: No Typology Code available for payment source | Admitting: Psychology

## 2021-03-08 DIAGNOSIS — F419 Anxiety disorder, unspecified: Secondary | ICD-10-CM

## 2021-03-08 DIAGNOSIS — F5089 Other specified eating disorder: Secondary | ICD-10-CM | POA: Diagnosis not present

## 2021-03-11 ENCOUNTER — Ambulatory Visit (INDEPENDENT_AMBULATORY_CARE_PROVIDER_SITE_OTHER): Payer: No Typology Code available for payment source | Admitting: Psychologist

## 2021-03-11 DIAGNOSIS — F411 Generalized anxiety disorder: Secondary | ICD-10-CM

## 2021-03-11 DIAGNOSIS — F330 Major depressive disorder, recurrent, mild: Secondary | ICD-10-CM

## 2021-03-15 NOTE — Progress Notes (Signed)
Office: [phone]  /  Fax: [phone]    Date: March 29, 2021   Appointment Start Time: 8:00am Duration: 28 minutes Provider: Glennie Isle, Psy.D. Type of Session: Individual Therapy  Location of Patient: Home  (private location) Location of Provider: Provider's Home (private office) Type of Contact: Telepsychological Visit via MyChart Video Visit  Session Content: Heidi Payne is a 55 y.o. female presenting for a follow-up appointment to address the previously established treatment goal of increasing coping skills.Today's appointment was a telepsychological visit due to COVID-19. Heidi Payne provided verbal consent for today's telepsychological appointment and she is aware she is responsible for securing confidentiality on her end of the session. Prior to proceeding with today's appointment, Heidi Payne's physical location at the time of this appointment was obtained as well a phone number she could be reached at in the event of technical difficulties. Heidi Payne and this provider participated in today's telepsychological service.   This provider conducted a brief check-in. Heidi Payne reported she has been "busy with work." She shared about a recent wedding she attended, noting, "I done really good food wise." Positive reinforcement provided as she discussed making better choices and engaging in portion control. A risk assessment was completed. Heidi Payne denied experiencing suicidal and homicidal ideation, plan, and intent since the last appointment with this provider. She reported she continues to have easy access to the developed safety plan, and continues to acknowledge understanding regarding the importance of reaching out to trusted individuals and/or emergency resources if she is unable to ensure safety.   Reviewed triggers for emotional eating behaviors. Due to the upcoming holidays, psychoeducation regarding making better choices and engaging in portion control during the holidays/celebrations  was provided. More specifically, this provider discussed the following strategies: coming to meals hungry, but not starving; avoid filling up on appetizers; managing portion sizes; not completely depriving yourself; making the plate colorful (e.g., vegetables); pacing yourself (e.g., waiting 10 minutes before going back for seconds); taking advantage of the nutritious foods; practicing mindfulness; staying hydrated; and avoid bringing home leftovers. Overall, Heidi Payne was receptive to today's appointment as evidenced by openness to sharing, responsiveness to feedback, and willingness to implement discussed strategies .  Mental Status Examination:  Appearance: well groomed and appropriate hygiene  Behavior: appropriate to circumstances Mood: euthymic Affect: mood congruent Speech: normal in rate, volume, and tone Eye Contact: appropriate Psychomotor Activity: appropriate Gait: unable to assess Thought Process: linear, logical, and goal directed  Thought Content/Perception: denies suicidal and homicidal ideation, plan, and intent, no hallucinations, delusions, bizarre thinking or behavior reported or observed, and denies ideation and engagement in self-injurious behaviors Orientation: time, person, place, and purpose of appointment Memory/Concentration: memory, attention, language, and fund of knowledge intact  Insight/Judgment: fair  Interventions:  Conducted a brief chart review Conducted a risk assessment Provided empathic reflections and validation Provided positive reinforcement Employed supportive psychotherapy interventions to facilitate reduced distress and to improve coping skills with identified stressors Strategies discussed for holidays/celebrations   DSM-5 Diagnosis(es): F50.89 Other Specified Feeding or Eating Disorder, Emotional Eating Behaviors and F41.9 Unspecified Anxiety Disorder  Treatment Goal & Progress: During the initial appointment with this provider, the following  treatment goal was established: increase coping skills. Heidi Payne has demonstrated progress in her goal as evidenced by increased awareness of hunger patterns, increased awareness of triggers for emotional eating behaviors, and reduction in emotional eating behaviors . Heidi Payne also continues to demonstrate willingness to engage in learned skill(s).  Plan: The next appointment will be scheduled in two weeks, which will be  via [place] Visit. The next session will focus on working towards the established treatment goal.

## 2021-03-16 ENCOUNTER — Ambulatory Visit (INDEPENDENT_AMBULATORY_CARE_PROVIDER_SITE_OTHER): Payer: No Typology Code available for payment source | Admitting: Psychologist

## 2021-03-16 ENCOUNTER — Other Ambulatory Visit (INDEPENDENT_AMBULATORY_CARE_PROVIDER_SITE_OTHER): Payer: Self-pay | Admitting: Family Medicine

## 2021-03-16 ENCOUNTER — Encounter (INDEPENDENT_AMBULATORY_CARE_PROVIDER_SITE_OTHER): Payer: Self-pay | Admitting: Family Medicine

## 2021-03-16 ENCOUNTER — Ambulatory Visit (INDEPENDENT_AMBULATORY_CARE_PROVIDER_SITE_OTHER): Payer: No Typology Code available for payment source | Admitting: Family Medicine

## 2021-03-16 ENCOUNTER — Other Ambulatory Visit: Payer: Self-pay

## 2021-03-16 VITALS — BP 133/88 | HR 119 | Temp 98.5°F | Ht 66.0 in | Wt 196.0 lb

## 2021-03-16 DIAGNOSIS — Z6834 Body mass index (BMI) 34.0-34.9, adult: Secondary | ICD-10-CM

## 2021-03-16 DIAGNOSIS — R7303 Prediabetes: Secondary | ICD-10-CM

## 2021-03-16 DIAGNOSIS — F330 Major depressive disorder, recurrent, mild: Secondary | ICD-10-CM | POA: Diagnosis not present

## 2021-03-16 DIAGNOSIS — E559 Vitamin D deficiency, unspecified: Secondary | ICD-10-CM

## 2021-03-16 DIAGNOSIS — E669 Obesity, unspecified: Secondary | ICD-10-CM

## 2021-03-16 DIAGNOSIS — F411 Generalized anxiety disorder: Secondary | ICD-10-CM

## 2021-03-16 MED ORDER — VITAMIN D (ERGOCALCIFEROL) 1.25 MG (50000 UNIT) PO CAPS: 50000.0000 [IU] | ORAL_CAPSULE | ORAL | 0 refills | Status: DC

## 2021-03-16 MED ORDER — TIRZEPATIDE 2.5 MG/0.5ML ~~LOC~~ SOAJ: 2.5000 mg | SUBCUTANEOUS | 0 refills | Status: DC

## 2021-03-17 NOTE — Progress Notes (Signed)
Chief Complaint:   OBESITY Heidi Payne is here to discuss her progress with her obesity treatment plan along with follow-up of her obesity related diagnoses. Heidi Payne is on the Category 3 Plan and states she is following her eating plan approximately 100% of the time. Heidi Payne states she is walking 45 minutes 2 times per week.  Today's visit was #: 3 Starting weight: 215 lbs Starting date: 01/27/2021 Today's weight: 196 lbs Today's date: 03/16/2021 Total lbs lost to date: 19 Total lbs lost since last in-office visit: 13  Interim History: Heidi Payne has started walking a few times a week and also added some kettle bell exercises. She is feeling really well on Mounjaro. Pt's only indulgence has been dry cereal. [facility]  Subjective:   1. Vitamin D deficiency Pt denies nausea, vomiting, and muscle weakness but notes fatigue. She is on prescription Vit D.  2. Pre-diabetes Heidi Payne is doing well on Mounjaro 2.5 with no GI side effects.  Assessment/Plan:   1. Vitamin D deficiency Low Vitamin D level contributes to fatigue and are associated with obesity, breast, and colon cancer. She agrees to continue to take prescription Vitamin D 50,000 IU every week and will follow-up for routine testing of Vitamin D, at least 2-3 times per year to avoid over-replacement.  Refill- Vitamin D, Ergocalciferol, (DRISDOL) 1.25 MG (50000 UNIT) CAPS capsule; Take 1 capsule (50,000 Units total) by mouth every 7 (seven) days.  Dispense: 4 capsule; Refill: 0  2. Pre-diabetes Heidi Payne will continue to work on weight loss, exercise, and decreasing simple carbohydrates to help decrease the risk of diabetes.   Refill- tirzepatide (MOUNJARO) 2.5 MG/0.5ML Pen; Inject 2.5 mg into the skin once a week.  Dispense: 2 mL; Refill: 0  3. Obesity BMI today is 36  Heidi Payne is currently in the action stage of change. As such, her goal is to continue with weight loss efforts. She has  agreed to the Category 3 Plan.   Exercise goals: All adults should avoid inactivity. Some physical activity is better than none, and adults who participate in any amount of physical activity gain some health benefits.  Behavioral modification strategies: increasing lean protein intake, meal planning and cooking strategies, keeping healthy foods in the home, and planning for success.  Heidi Payne has agreed to follow-up with our clinic in 2-3 weeks. She was informed of the importance of frequent follow-up visits to maximize her success with intensive lifestyle modifications for her multiple health conditions.   Objective:   Blood pressure 133/88, pulse (!) 119, temperature 98.5 F (36.9 C), height 5\' 6"  (1.676 m), weight 196 lb (88.9 kg), last menstrual period 12/12/2018, SpO2 97 %. Body mass index is 31.64 kg/m.  General: Cooperative, alert, well developed, in no acute distress. HEENT: Conjunctivae and lids unremarkable. Cardiovascular: Regular rhythm.  Lungs: Normal work of breathing. Neurologic: No focal deficits.   Lab Results  Component Value Date   CREATININE 0.76 01/27/2021   BUN 13 01/27/2021   NA 142 01/27/2021   K 4.0 01/27/2021   CL 103 01/27/2021   CO2 25 01/27/2021   Lab Results  Component Value Date   ALT 44 (H) 01/27/2021   AST 29 01/27/2021   ALKPHOS 130 (H) 01/27/2021   BILITOT 0.4 01/27/2021   Lab Results  Component Value Date   HGBA1C 6.1 (H) 01/27/2021   Lab Results  Component Value Date   INSULIN 26.6 (H) 01/27/2021   Lab Results  Component Value Date  TSH 1.110 01/27/2021   Lab Results  Component Value Date   CHOL 183 01/27/2021   HDL 52 01/27/2021   LDLCALC 103 (H) 01/27/2021   TRIG 158 (H) 01/27/2021   CHOLHDL 5.6 12/30/2015   Lab Results  Component Value Date   VD25OH 26.2 (L) 01/27/2021   Lab Results  Component Value Date   WBC 6.2 01/27/2021   HGB 14.2 01/27/2021   HCT 42.9 01/27/2021   MCV 84 01/27/2021   PLT 296 01/27/2021     Attestation Statements:   Reviewed by clinician on day of visit: allergies, medications, problem list, medical history, surgical history, family history, social history, and previous encounter notes.  Coral Ceo, CMA, am acting as transcriptionist for Coralie Common, MD.   I have reviewed the above documentation for accuracy and completeness, and I agree with the above. - Coralie Common, MD

## 2021-03-28 ENCOUNTER — Other Ambulatory Visit: Payer: Self-pay

## 2021-03-28 ENCOUNTER — Ambulatory Visit (INDEPENDENT_AMBULATORY_CARE_PROVIDER_SITE_OTHER): Payer: No Typology Code available for payment source | Admitting: Psychologist

## 2021-03-28 DIAGNOSIS — F411 Generalized anxiety disorder: Secondary | ICD-10-CM | POA: Diagnosis not present

## 2021-03-28 DIAGNOSIS — F330 Major depressive disorder, recurrent, mild: Secondary | ICD-10-CM | POA: Diagnosis not present

## 2021-03-29 ENCOUNTER — Encounter (INDEPENDENT_AMBULATORY_CARE_PROVIDER_SITE_OTHER): Payer: Self-pay | Admitting: Family Medicine

## 2021-03-29 ENCOUNTER — Ambulatory Visit (INDEPENDENT_AMBULATORY_CARE_PROVIDER_SITE_OTHER): Payer: No Typology Code available for payment source | Admitting: Family Medicine

## 2021-03-29 ENCOUNTER — Telehealth (INDEPENDENT_AMBULATORY_CARE_PROVIDER_SITE_OTHER): Payer: No Typology Code available for payment source | Admitting: Psychology

## 2021-03-29 ENCOUNTER — Other Ambulatory Visit (INDEPENDENT_AMBULATORY_CARE_PROVIDER_SITE_OTHER): Payer: Self-pay | Admitting: Family Medicine

## 2021-03-29 VITALS — BP 118/78 | HR 100 | Temp 98.5°F | Ht 66.0 in | Wt 195.0 lb

## 2021-03-29 DIAGNOSIS — R7303 Prediabetes: Secondary | ICD-10-CM

## 2021-03-29 DIAGNOSIS — Z6834 Body mass index (BMI) 34.0-34.9, adult: Secondary | ICD-10-CM | POA: Diagnosis not present

## 2021-03-29 DIAGNOSIS — F5089 Other specified eating disorder: Secondary | ICD-10-CM | POA: Diagnosis not present

## 2021-03-29 DIAGNOSIS — E559 Vitamin D deficiency, unspecified: Secondary | ICD-10-CM | POA: Diagnosis not present

## 2021-03-29 DIAGNOSIS — E669 Obesity, unspecified: Secondary | ICD-10-CM | POA: Diagnosis not present

## 2021-03-29 DIAGNOSIS — F419 Anxiety disorder, unspecified: Secondary | ICD-10-CM

## 2021-03-29 MED ORDER — TIRZEPATIDE 2.5 MG/0.5ML ~~LOC~~ SOAJ: 2.5000 mg | SUBCUTANEOUS | 0 refills | Status: DC

## 2021-03-29 MED ORDER — VITAMIN D (ERGOCALCIFEROL) 1.25 MG (50000 UNIT) PO CAPS: 50000.0000 [IU] | ORAL_CAPSULE | ORAL | 0 refills | Status: DC

## 2021-03-29 NOTE — Telephone Encounter (Signed)
Please check PA, Thanks

## 2021-03-29 NOTE — Telephone Encounter (Signed)
Prior authorization has been started for Mounjaro. Will notify patient and provider once response is received.  

## 2021-03-29 NOTE — Progress Notes (Signed)
Chief Complaint:   OBESITY Heidi Payne is here to discuss her progress with her obesity treatment plan along with follow-up of her obesity related diagnoses. Heidi Payne is on the Category 3 Plan and states she is following her eating plan approximately 70% of the time. Heidi Payne states she is walking 3 miles 2 times per week.  Today's visit was #: 4 Starting weight: 215 lbs Starting date: 01/27/2021 Today's weight: 195 lbs Today's date: 03/29/2021 Total lbs lost to date: 20 Total lbs lost since last in-office visit: 1  Interim History: Heidi Payne went to a wedding this past weekend and was able to mostly stay on track- only minimally indulged. She is hosting Thanksgiving dinner at her house. Her biggest obstacle in the next few weeks is not overindulging. Pt hasn't thought through or planned December holidays yet.  Subjective:   1. Vitamin D deficiency Pt denies nausea, vomiting, and muscle weakness but notes fatigue. Her last Vit D level was 26.2.  2. Pre-diabetes Heidi Payne is doing well on Mounjaro. She is noticing satiety but denies abdominal pain, nausea, or vomiting.  Assessment/Plan:   1. Vitamin D deficiency Low Vitamin D level contributes to fatigue and are associated with obesity, breast, and colon cancer. She agrees to continue to take prescription Vitamin D 50,000 IU every week and will follow-up for routine testing of Vitamin D, at least 2-3 times per year to avoid over-replacement.  Refill- Vitamin D, Ergocalciferol, (DRISDOL) 1.25 MG (50000 UNIT) CAPS capsule; Take 1 capsule (50,000 Units total) by mouth every 7 (seven) days.  Dispense: 4 capsule; Refill: 0  2. Pre-diabetes Heidi Payne will continue to work on weight loss, exercise, and decreasing simple carbohydrates to help decrease the risk of diabetes. No need for increase in Heidi Payne.  Refill- tirzepatide (MOUNJARO) 2.5 MG/0.5ML Pen; Inject 2.5 mg into the skin once a week.  Dispense: 2 mL; Refill: 0  3. Obesity BMI  today is 68  Heidi Payne is currently in the action stage of change. As such, her goal is to continue with weight loss efforts. She has agreed to the Category 3 Plan.   Exercise goals:  As is  Behavioral modification strategies: increasing lean protein intake, meal planning and cooking strategies, keeping healthy foods in the home, holiday eating strategies , and planning for success.  Heidi Payne has agreed to follow-up with our clinic in 3 weeks. She was informed of the importance of frequent follow-up visits to maximize her success with intensive lifestyle modifications for her multiple health conditions.   Objective:   Blood pressure 118/78, pulse 100, temperature 98.5 F (36.9 C), height 5\' 6"  (1.676 m), weight 195 lb (88.5 kg), last menstrual period 12/12/2018, SpO2 98 %. Body mass index is 31.47 kg/m.  General: Cooperative, alert, well developed, in no acute distress. HEENT: Conjunctivae and lids unremarkable. Cardiovascular: Regular rhythm.  Lungs: Normal work of breathing. Neurologic: No focal deficits.   Lab Results  Component Value Date   CREATININE 0.76 01/27/2021   BUN 13 01/27/2021   NA 142 01/27/2021   K 4.0 01/27/2021   CL 103 01/27/2021   CO2 25 01/27/2021   Lab Results  Component Value Date   ALT 44 (H) 01/27/2021   AST 29 01/27/2021   ALKPHOS 130 (H) 01/27/2021   BILITOT 0.4 01/27/2021   Lab Results  Component Value Date   HGBA1C 6.1 (H) 01/27/2021   Lab Results  Component Value Date   INSULIN 26.6 (H) 01/27/2021   Lab Results  Component Value Date  TSH 1.110 01/27/2021   Lab Results  Component Value Date   CHOL 183 01/27/2021   HDL 52 01/27/2021   LDLCALC 103 (H) 01/27/2021   TRIG 158 (H) 01/27/2021   CHOLHDL 5.6 12/30/2015   Lab Results  Component Value Date   VD25OH 26.2 (L) 01/27/2021   Lab Results  Component Value Date   WBC 6.2 01/27/2021   HGB 14.2 01/27/2021   HCT 42.9 01/27/2021   MCV 84 01/27/2021   PLT 296 01/27/2021     Attestation Statements:   Reviewed by clinician on day of visit: allergies, medications, problem list, medical history, surgical history, family history, social history, and previous encounter notes.  Coral Ceo, CMA, am acting as transcriptionist for Coralie Common, MD.   I have reviewed the above documentation for accuracy and completeness, and I agree with the above. - Coralie Common, MD

## 2021-03-29 NOTE — Progress Notes (Signed)
  Office: [phone]  /  Fax: [phone]    Date: April 12, 2021   Appointment Start Time: 8:00am Duration: 25 minutes Provider: Glennie Isle, Psy.D. Type of Session: Individual Therapy  Location of Patient: Home (private location) Location of Provider: Provider's Home (private office) Type of Contact: Telepsychological Visit via MyChart Video Visit  Session Content: Heidi Payne is a 55 y.o. female presenting for a follow-up appointment to address the previously established treatment goal of increasing coping skills.Today's appointment was a telepsychological visit due to COVID-19. Heidi Payne provided verbal consent for today's telepsychological appointment and she is aware she is responsible for securing confidentiality on her end of the session. Prior to proceeding with today's appointment, Heidi Payne's physical location at the time of this appointment was obtained as well a phone number she could be reached at in the event of technical difficulties. Heidi Payne and this provider participated in today's telepsychological service.   This provider conducted a brief check-in. Heidi Payne shared about Thanksgiving, noting she engaged in portion control and made better choices. She denied overeating/overindulging. She also discussed implementing discussed strategies, adding, "It felt good." Positive reinforcement was provided. Briefly discussed generalization of holiday strategies for Christmas.   Heidi Payne discussed "working on mindfulness" with her primary therapist, Heidi Payne. As such, brief psychoeducation regarding mindfulness was provided as it relates to eating-related concerns. She discussed utilizing the "Calm" app for mindfulness exercises when experiencing unpleasant emotions that trigger her to want to eat. This provider led Heidi Payne through a mindfulness exercise involving her senses and discussed the benefit of utilizing this exercise in the moment when triggered to eat. Experience was  processed. Overall, Heidi Payne was receptive to today's appointment as evidenced by openness to sharing, responsiveness to feedback, and willingness to continue engaging in learned skills.  Mental Status Examination:  Appearance: well groomed and appropriate hygiene  Behavior: appropriate to circumstances Mood: euthymic Affect: mood congruent Speech: normal in rate, volume, and tone Eye Contact: appropriate Psychomotor Activity: appropriate Gait: unable to assess Thought Process: linear, logical, and goal directed  Thought Content/Perception: no hallucinations, delusions, bizarre thinking or behavior reported or observed and denies suicidal and homicidal ideation, plan, and intent Orientation: time, person, place, and purpose of appointment Memory/Concentration: memory, attention, language, and fund of knowledge intact  Insight/Judgment: good  Interventions:  Conducted a brief chart review Conducted a risk assessment Provided empathic reflections and validation Reviewed content from the previous session Provided positive reinforcement Employed supportive psychotherapy interventions to facilitate reduced distress and to improve coping skills with identified stressors Psychoeducation provided regarding mindfulness Engaged patient in mindfulness exercise(s)  DSM-5 Diagnosis(es): F50.89 Other Specified Feeding or Eating Disorder, Emotional Eating Behaviors and F41.9 Unspecified Anxiety Disorder  Treatment Goal & Progress: During the initial appointment with this provider, the following treatment goal was established: increase coping skills. Heidi Payne demonstrated progress in her goal as evidenced by increased awareness of hunger patterns, increased awareness of triggers for emotional eating behaviors, and reduction in emotional eating behaviors . Heidi Payne also continues to demonstrate willingness to engage in learned skill(s).  Plan: Heidi Payne declined future appointments with this provider as  she plans to continue with her primary therapist. She acknowledged understanding that she may request a follow-up appointment with this provider in the future as long as she is still established with the clinic. No further follow-up planned by this provider.

## 2021-03-30 ENCOUNTER — Other Ambulatory Visit (INDEPENDENT_AMBULATORY_CARE_PROVIDER_SITE_OTHER): Payer: Self-pay | Admitting: Family Medicine

## 2021-03-30 ENCOUNTER — Ambulatory Visit (INDEPENDENT_AMBULATORY_CARE_PROVIDER_SITE_OTHER): Payer: No Typology Code available for payment source | Admitting: Family Medicine

## 2021-03-30 ENCOUNTER — Encounter (INDEPENDENT_AMBULATORY_CARE_PROVIDER_SITE_OTHER): Payer: Self-pay

## 2021-03-30 ENCOUNTER — Telehealth (INDEPENDENT_AMBULATORY_CARE_PROVIDER_SITE_OTHER): Payer: Self-pay | Admitting: Family Medicine

## 2021-03-30 DIAGNOSIS — R7303 Prediabetes: Secondary | ICD-10-CM

## 2021-03-30 NOTE — Telephone Encounter (Signed)
Pt last seen by Dr. Ukleja.  

## 2021-03-30 NOTE — Telephone Encounter (Signed)
Prior authorization was denied for [facility]. Patient sent mychart message.

## 2021-04-12 ENCOUNTER — Telehealth (INDEPENDENT_AMBULATORY_CARE_PROVIDER_SITE_OTHER): Payer: No Typology Code available for payment source | Admitting: Psychology

## 2021-04-12 DIAGNOSIS — F419 Anxiety disorder, unspecified: Secondary | ICD-10-CM

## 2021-04-12 DIAGNOSIS — F5089 Other specified eating disorder: Secondary | ICD-10-CM | POA: Diagnosis not present

## 2021-04-18 ENCOUNTER — Ambulatory Visit: Payer: No Typology Code available for payment source | Admitting: Psychologist

## 2021-04-25 ENCOUNTER — Ambulatory Visit (INDEPENDENT_AMBULATORY_CARE_PROVIDER_SITE_OTHER): Payer: No Typology Code available for payment source | Admitting: Family Medicine

## 2021-04-25 ENCOUNTER — Other Ambulatory Visit: Payer: Self-pay

## 2021-04-25 ENCOUNTER — Encounter (INDEPENDENT_AMBULATORY_CARE_PROVIDER_SITE_OTHER): Payer: Self-pay | Admitting: Family Medicine

## 2021-04-25 VITALS — BP 134/81 | HR 83 | Temp 98.1°F | Ht 66.0 in | Wt 193.0 lb

## 2021-04-25 DIAGNOSIS — Z6834 Body mass index (BMI) 34.0-34.9, adult: Secondary | ICD-10-CM | POA: Diagnosis not present

## 2021-04-25 DIAGNOSIS — E669 Obesity, unspecified: Secondary | ICD-10-CM

## 2021-04-25 DIAGNOSIS — E559 Vitamin D deficiency, unspecified: Secondary | ICD-10-CM

## 2021-04-25 DIAGNOSIS — R7303 Prediabetes: Secondary | ICD-10-CM | POA: Diagnosis not present

## 2021-04-25 MED ORDER — VITAMIN D (ERGOCALCIFEROL) 1.25 MG (50000 UNIT) PO CAPS: 50000.0000 [IU] | ORAL_CAPSULE | ORAL | 0 refills | Status: DC

## 2021-04-25 MED ORDER — TIRZEPATIDE 5 MG/0.5ML ~~LOC~~ SOAJ: 5.0000 mg | SUBCUTANEOUS | 0 refills | Status: DC

## 2021-04-25 NOTE — Progress Notes (Signed)
Chief Complaint:   OBESITY Heidi Payne is here to discuss her progress with her obesity treatment plan along with follow-up of her obesity related diagnoses. Heidi Payne is on the Category 3 Plan and states she is following her eating plan approximately 100% of the time. Heidi Payne states she is walking 40-45 minutes 1-2 times per week.  Today's visit was #: 5 Starting weight: 215 lbs Starting date: 01/27/2021 Today's weight: 193 lbs Today's date: 04/25/2021 Total lbs lost to date: 22 Total lbs lost since last in-office visit: 2  Interim History: Heidi Payne had a good Thanksgiving. She is noticing appetite suppression is wearing off and sweets cravings have increased. She has Christmas dinner tonight and then no other plans for the next few weeks. Most of the time she is using snack calories for creamer, blueberries, and apples.  Subjective:   1. Prediabetes Pt is on Mounjaro 2.5 mg with GI side effects but satiety is wearing off.  2. Vitamin D deficiency Pt denies nausea, vomiting, and muscle weakness but notes fatigue. She is on prescription Vit D.  Assessment/Plan:   1. Prediabetes Heidi Payne will increase Mounjaro to 5 mg as prescribed and continue to work on weight loss, exercise, and decreasing simple carbohydrates to help decrease the risk of diabetes.   Increase and Refill- tirzepatide (MOUNJARO) 5 MG/0.5ML Pen; Inject 5 mg into the skin once a week.  Dispense: 6 mL; Refill: 0  2. Vitamin D deficiency Low Vitamin D level contributes to fatigue and are associated with obesity, breast, and colon cancer. She agrees to continue to take prescription Vitamin D 50,000 IU every week and will follow-up for routine testing of Vitamin D, at least 2-3 times per year to avoid over-replacement.  Refill- Vitamin D, Ergocalciferol, (DRISDOL) 1.25 MG (50000 UNIT) CAPS capsule; Take 1 capsule (50,000 Units total) by mouth every 7 (seven) days.  Dispense: 4 capsule; Refill: 0  3. Obesity with  current BMI of 31.2  Heidi Payne is currently in the action stage of change. As such, her goal is to continue with weight loss efforts. She has agreed to the Category 3 Plan.   Exercise goals: All adults should avoid inactivity. Some physical activity is better than none, and adults who participate in any amount of physical activity gain some health benefits.  Behavioral modification strategies: increasing lean protein intake, meal planning and cooking strategies, keeping healthy foods in the home, and planning for success.  Heidi Payne has agreed to follow-up with our clinic in 3 weeks. She was informed of the importance of frequent follow-up visits to maximize her success with intensive lifestyle modifications for her multiple health conditions.   Objective:   Blood pressure 134/81, pulse 83, temperature 98.1 F (36.7 C), height 5\' 6"  (1.676 m), weight 193 lb (87.5 kg), last menstrual period 12/12/2018, SpO2 98 %. Body mass index is 31.15 kg/m.  General: Cooperative, alert, well developed, in no acute distress. HEENT: Conjunctivae and lids unremarkable. Cardiovascular: Regular rhythm.  Lungs: Normal work of breathing. Neurologic: No focal deficits.   Lab Results  Component Value Date   CREATININE 0.76 01/27/2021   BUN 13 01/27/2021   NA 142 01/27/2021   K 4.0 01/27/2021   CL 103 01/27/2021   CO2 25 01/27/2021   Lab Results  Component Value Date   ALT 44 (H) 01/27/2021   AST 29 01/27/2021   ALKPHOS 130 (H) 01/27/2021   BILITOT 0.4 01/27/2021   Lab Results  Component Value Date   HGBA1C 6.1 (H) 01/27/2021  Lab Results  Component Value Date   INSULIN 26.6 (H) 01/27/2021   Lab Results  Component Value Date   TSH 1.110 01/27/2021   Lab Results  Component Value Date   CHOL 183 01/27/2021   HDL 52 01/27/2021   LDLCALC 103 (H) 01/27/2021   TRIG 158 (H) 01/27/2021   CHOLHDL 5.6 12/30/2015   Lab Results  Component Value Date   VD25OH 26.2 (L) 01/27/2021   Lab Results   Component Value Date   WBC 6.2 01/27/2021   HGB 14.2 01/27/2021   HCT 42.9 01/27/2021   MCV 84 01/27/2021   PLT 296 01/27/2021    Attestation Statements:   Reviewed by clinician on day of visit: allergies, medications, problem list, medical history, surgical history, family history, social history, and previous encounter notes.  Coral Ceo, CMA, am acting as transcriptionist for Coralie Common, MD.   I have reviewed the above documentation for accuracy and completeness, and I agree with the above. - Coralie Common, MD

## 2021-05-23 ENCOUNTER — Ambulatory Visit: Payer: No Typology Code available for payment source | Admitting: Psychologist

## 2021-05-24 ENCOUNTER — Other Ambulatory Visit: Payer: Self-pay

## 2021-05-24 ENCOUNTER — Ambulatory Visit (INDEPENDENT_AMBULATORY_CARE_PROVIDER_SITE_OTHER): Payer: No Typology Code available for payment source | Admitting: Family Medicine

## 2021-05-24 ENCOUNTER — Encounter (INDEPENDENT_AMBULATORY_CARE_PROVIDER_SITE_OTHER): Payer: Self-pay | Admitting: Family Medicine

## 2021-05-24 VITALS — BP 140/80 | HR 100 | Temp 98.2°F | Ht 66.0 in | Wt 190.0 lb

## 2021-05-24 DIAGNOSIS — E669 Obesity, unspecified: Secondary | ICD-10-CM

## 2021-05-24 DIAGNOSIS — E559 Vitamin D deficiency, unspecified: Secondary | ICD-10-CM | POA: Diagnosis not present

## 2021-05-24 DIAGNOSIS — Z6830 Body mass index (BMI) 30.0-30.9, adult: Secondary | ICD-10-CM

## 2021-05-24 DIAGNOSIS — R7303 Prediabetes: Secondary | ICD-10-CM | POA: Diagnosis not present

## 2021-05-24 MED ORDER — TIRZEPATIDE 5 MG/0.5ML ~~LOC~~ SOAJ: 5.0000 mg | SUBCUTANEOUS | 0 refills | Status: DC

## 2021-05-24 MED ORDER — VITAMIN D (ERGOCALCIFEROL) 1.25 MG (50000 UNIT) PO CAPS: 50000.0000 [IU] | ORAL_CAPSULE | ORAL | 0 refills | Status: DC

## 2021-05-25 NOTE — Progress Notes (Signed)
Chief Complaint:   OBESITY Heidi Payne is here to discuss her progress with her obesity treatment plan along with follow-up of her obesity related diagnoses. Heidi Payne is on the Category 3 Plan and states she is following her eating plan approximately 80% of the time. Heidi Payne states she is not currently exercising.  Today's visit was #: 6 Starting weight: 215 lbs Starting date: 01/27/2021 Today's weight: 190 lbs Today's date: 05/24/2021 Total lbs lost to date: 25 Total lbs lost since last in-office visit: 3  Interim History: Pt didn't get much time off all holiday season. She has had increased stress at work. She has followed plan most of the time and did not overindulge even with food options that she enjoyed. Pt has not done any stress eating. She has no upcoming plans. No foreseeable obstacles.  Subjective:   1. Vitamin D deficiency Pt denies nausea, vomiting, and muscle weakness but notes fatigue. She is on prescription Vit D.  2. Prediabetes Pt is doing well on 5 mg Mounjaro with good craving control.  Assessment/Plan:   1. Vitamin D deficiency Low Vitamin D level contributes to fatigue and are associated with obesity, breast, and colon cancer. She agrees to continue to take prescription Vitamin D 50,000 IU every week and will follow-up for routine testing of Vitamin D, at least 2-3 times per year to avoid over-replacement.  Refill- Vitamin D, Ergocalciferol, (DRISDOL) 1.25 MG (50000 UNIT) CAPS capsule; Take 1 capsule (50,000 Units total) by mouth every 7 (seven) days.  Dispense: 4 capsule; Refill: 0  2. Prediabetes Donella will continue to work on weight loss, exercise, and decreasing simple carbohydrates to help decrease the risk of diabetes.   Refill- tirzepatide (MOUNJARO) 5 MG/0.5ML Pen; Inject 5 mg into the skin once a week.  Dispense: 2 mL; Refill: 0  3. Obesity with current BMI of 30.7  Heidi Payne is currently in the action stage of change. As such, her goal is to  continue with weight loss efforts. She has agreed to the Category 3 Plan.   Exercise goals:  Pt is to contemplate what activity to start implementing 10 minutes 3 times a week.  Behavioral modification strategies: increasing lean protein intake and meal planning and cooking strategies.  Heidi Payne has agreed to follow-up with our clinic in 3 weeks. She was informed of the importance of frequent follow-up visits to maximize her success with intensive lifestyle modifications for her multiple health conditions.   Objective:   Blood pressure 140/80, pulse 100, temperature 98.2 F (36.8 C), height 5\' 6"  (1.676 m), weight 190 lb (86.2 kg), last menstrual period 12/12/2018, SpO2 98 %. Body mass index is 30.67 kg/m.  General: Cooperative, alert, well developed, in no acute distress. HEENT: Conjunctivae and lids unremarkable. Cardiovascular: Regular rhythm.  Lungs: Normal work of breathing. Neurologic: No focal deficits.   Lab Results  Component Value Date   CREATININE 0.76 01/27/2021   BUN 13 01/27/2021   NA 142 01/27/2021   K 4.0 01/27/2021   CL 103 01/27/2021   CO2 25 01/27/2021   Lab Results  Component Value Date   ALT 44 (H) 01/27/2021   AST 29 01/27/2021   ALKPHOS 130 (H) 01/27/2021   BILITOT 0.4 01/27/2021   Lab Results  Component Value Date   HGBA1C 6.1 (H) 01/27/2021   Lab Results  Component Value Date   INSULIN 26.6 (H) 01/27/2021   Lab Results  Component Value Date   TSH 1.110 01/27/2021   Lab Results  Component  Value Date   CHOL 183 01/27/2021   HDL 52 01/27/2021   LDLCALC 103 (H) 01/27/2021   TRIG 158 (H) 01/27/2021   CHOLHDL 5.6 12/30/2015   Lab Results  Component Value Date   VD25OH 26.2 (L) 01/27/2021   Lab Results  Component Value Date   WBC 6.2 01/27/2021   HGB 14.2 01/27/2021   HCT 42.9 01/27/2021   MCV 84 01/27/2021   PLT 296 01/27/2021   Attestation Statements:   Reviewed by clinician on day of visit: allergies, medications, problem  list, medical history, surgical history, family history, social history, and previous encounter notes.  Coral Ceo, CMA, am acting as transcriptionist for Coralie Common, MD.   I have reviewed the above documentation for accuracy and completeness, and I agree with the above. - Coralie Common, MD

## 2021-05-29 ENCOUNTER — Ambulatory Visit: Payer: No Typology Code available for payment source | Admitting: Urology

## 2021-06-14 ENCOUNTER — Encounter (INDEPENDENT_AMBULATORY_CARE_PROVIDER_SITE_OTHER): Payer: Self-pay | Admitting: Family Medicine

## 2021-06-14 ENCOUNTER — Other Ambulatory Visit: Payer: Self-pay

## 2021-06-14 ENCOUNTER — Ambulatory Visit (INDEPENDENT_AMBULATORY_CARE_PROVIDER_SITE_OTHER): Payer: No Typology Code available for payment source | Admitting: Family Medicine

## 2021-06-14 VITALS — BP 124/85 | HR 111 | Temp 98.9°F | Ht 66.0 in | Wt 186.0 lb

## 2021-06-14 DIAGNOSIS — I10 Essential (primary) hypertension: Secondary | ICD-10-CM | POA: Diagnosis not present

## 2021-06-14 DIAGNOSIS — E559 Vitamin D deficiency, unspecified: Secondary | ICD-10-CM | POA: Diagnosis not present

## 2021-06-14 DIAGNOSIS — Z6830 Body mass index (BMI) 30.0-30.9, adult: Secondary | ICD-10-CM

## 2021-06-14 DIAGNOSIS — E669 Obesity, unspecified: Secondary | ICD-10-CM

## 2021-06-14 DIAGNOSIS — R7303 Prediabetes: Secondary | ICD-10-CM

## 2021-06-14 MED ORDER — VITAMIN D (ERGOCALCIFEROL) 1.25 MG (50000 UNIT) PO CAPS: 50000.0000 [IU] | ORAL_CAPSULE | ORAL | 0 refills | Status: DC

## 2021-06-14 MED ORDER — TIRZEPATIDE 5 MG/0.5ML ~~LOC~~ SOAJ: 5.0000 mg | SUBCUTANEOUS | 0 refills | Status: DC

## 2021-06-14 NOTE — Progress Notes (Signed)
Chief Complaint:   OBESITY Heidi Payne is here to discuss her progress with her obesity treatment plan along with follow-up of her obesity related diagnoses. Heidi Payne is on the Category 3 Plan and states she is following her eating plan approximately 80% of the time. Heidi Payne states she is exercising 10 minutes 3 times per week.  Today's visit was #: 7 Starting weight: 215 lbs Starting date: 01/27/2021 Today's weight: 186 lbs Today's date: 06/14/2021 Total lbs lost to date: 29 Total lbs lost since last in-office visit: 4  Interim History: Pt has been mostly working and trying to incorporate some focused activity. She was trying to do 10 minutes 3 times a day 3 days a week. She is getting tired of meat and protein bars. Pt has not tried any vegetarian options. She has no upcoming plans over the next few weeks. She is not sure if she is experiencing hunger or cravings.  Subjective:   1. Vitamin D deficiency Pt denies nausea, vomiting, and muscle weakness but notes fatigue. She is on prescription Vit D and her last level was 26.2.  2. Prediabetes Pt's last A1c was 6.1 with an insulin level of 26.6. She was doing well on Mounjaro but now on backorder.  3. Essential hypertension BP well controlled today. Pt denies chest pain/chest pressure/headache. BP labile in the past.  Assessment/Plan:   1. Vitamin D deficiency Low Vitamin D level contributes to fatigue and are associated with obesity, breast, and colon cancer. She agrees to continue to take prescription Vitamin D 50,000 IU every week and will follow-up for routine testing of Vitamin D, at least 2-3 times per year to avoid over-replacement.  Refill- Vitamin D, Ergocalciferol, (DRISDOL) 1.25 MG (50000 UNIT) CAPS capsule; Take 1 capsule (50,000 Units total) by mouth every 7 (seven) days.  Dispense: 4 capsule; Refill: 0  2. Prediabetes Heidi Payne will continue to work on weight loss, exercise, and decreasing simple carbohydrates to  help decrease the risk of diabetes.   Refill- tirzepatide (MOUNJARO) 5 MG/0.5ML Pen; Inject 5 mg into the skin once a week.  Dispense: 2 mL; Refill: 0  3. Essential hypertension Heidi Payne is working on healthy weight loss and exercise to improve blood pressure control. We will watch for signs of hypotension as she continues her lifestyle modifications. F/u on BP at next appt in hopes to start titrating down.  4. Obesity with current BMI of 30.1 Heidi Payne is currently in the action stage of change. As such, her goal is to continue with weight loss efforts. She has agreed to the Category 3 Plan and the [place] + 300 calories.   Exercise goals: All adults should avoid inactivity. Some physical activity is better than none, and adults who participate in any amount of physical activity gain some health benefits.  Behavioral modification strategies: increasing lean protein intake, meal planning and cooking strategies, keeping healthy foods in the home, and planning for success.  Heidi Payne has agreed to follow-up with our clinic in 2 weeks. She was informed of the importance of frequent follow-up visits to maximize her success with intensive lifestyle modifications for her multiple health conditions.   Objective:   Blood pressure 124/85, pulse (!) 111, temperature 98.9 F (37.2 C), height 5\' 6"  (1.676 m), weight 186 lb (84.4 kg), last menstrual period 12/12/2018, SpO2 98 %. Body mass index is 30.02 kg/m.  General: Cooperative, alert, well developed, in no acute distress. HEENT: Conjunctivae and lids unremarkable. Cardiovascular: Regular rhythm.  Lungs: Normal work of  breathing. Neurologic: No focal deficits.   Lab Results  Component Value Date   CREATININE 0.76 01/27/2021   BUN 13 01/27/2021   NA 142 01/27/2021   K 4.0 01/27/2021   CL 103 01/27/2021   CO2 25 01/27/2021   Lab Results  Component Value Date   ALT 44 (H) 01/27/2021   AST 29 01/27/2021   ALKPHOS 130 (H) 01/27/2021    BILITOT 0.4 01/27/2021   Lab Results  Component Value Date   HGBA1C 6.1 (H) 01/27/2021   Lab Results  Component Value Date   INSULIN 26.6 (H) 01/27/2021   Lab Results  Component Value Date   TSH 1.110 01/27/2021   Lab Results  Component Value Date   CHOL 183 01/27/2021   HDL 52 01/27/2021   LDLCALC 103 (H) 01/27/2021   TRIG 158 (H) 01/27/2021   CHOLHDL 5.6 12/30/2015   Lab Results  Component Value Date   VD25OH 26.2 (L) 01/27/2021   Lab Results  Component Value Date   WBC 6.2 01/27/2021   HGB 14.2 01/27/2021   HCT 42.9 01/27/2021   MCV 84 01/27/2021   PLT 296 01/27/2021    Attestation Statements:   Reviewed by clinician on day of visit: allergies, medications, problem list, medical history, surgical history, family history, social history, and previous encounter notes.  Coral Ceo, CMA, am acting as transcriptionist for Coralie Common, MD.   I have reviewed the above documentation for accuracy and completeness, and I agree with the above. - Coralie Common, MD

## 2021-06-22 ENCOUNTER — Encounter (INDEPENDENT_AMBULATORY_CARE_PROVIDER_SITE_OTHER): Payer: Self-pay | Admitting: Family Medicine

## 2021-06-27 ENCOUNTER — Other Ambulatory Visit: Payer: Self-pay

## 2021-06-27 ENCOUNTER — Encounter (INDEPENDENT_AMBULATORY_CARE_PROVIDER_SITE_OTHER): Payer: Self-pay | Admitting: Family Medicine

## 2021-06-27 ENCOUNTER — Ambulatory Visit (INDEPENDENT_AMBULATORY_CARE_PROVIDER_SITE_OTHER): Payer: No Typology Code available for payment source | Admitting: Family Medicine

## 2021-06-27 VITALS — BP 102/69 | HR 97 | Temp 98.4°F | Ht 66.0 in | Wt 188.0 lb

## 2021-06-27 DIAGNOSIS — Z6830 Body mass index (BMI) 30.0-30.9, adult: Secondary | ICD-10-CM | POA: Diagnosis not present

## 2021-06-27 DIAGNOSIS — E669 Obesity, unspecified: Secondary | ICD-10-CM | POA: Diagnosis not present

## 2021-06-27 DIAGNOSIS — R7303 Prediabetes: Secondary | ICD-10-CM

## 2021-06-27 DIAGNOSIS — R7401 Elevation of levels of liver transaminase levels: Secondary | ICD-10-CM | POA: Diagnosis not present

## 2021-06-27 DIAGNOSIS — E66811 Obesity, class 1: Secondary | ICD-10-CM

## 2021-06-27 DIAGNOSIS — Z6834 Body mass index (BMI) 34.0-34.9, adult: Secondary | ICD-10-CM

## 2021-06-28 ENCOUNTER — Encounter (INDEPENDENT_AMBULATORY_CARE_PROVIDER_SITE_OTHER): Payer: Self-pay

## 2021-06-28 NOTE — Progress Notes (Signed)
Chief Complaint:   OBESITY Heidi Payne is here to discuss her progress with her obesity treatment plan along with follow-up of her obesity related diagnoses. Heidi Payne is on the Category 3 Plan and the [place] + 300 calories and states she is following her eating plan approximately 90% of the time. Heidi Payne states she is going to the gym 30 minutes 3 times per week.  Today's visit was #: 8 Starting weight: 215 lbs Starting date: 01/27/2021 Today's weight: 188 lbs Today's date: 06/27/2021 Total lbs lost to date: 27 Total lbs lost since last in-office visit: 0  Interim History: Pt did have a splurge 2 days ago from [place], and did have some nausea afterwards and some alcoholic beverages. She did feel sick the following day as well. This was her first episode of stress eating since starting the program. Hopefully, stress will alleviate by the end of this week. She has gotten into activity by going to the gym 3 times a week.  Subjective:   1. Prediabetes Pt is on Mounjaro 5 mg and doing well.  2. Transaminitis Pt's last LFT's were slightly elevated. No scans on Epic.  Assessment/Plan:   1. Prediabetes Heidi Payne will continue to work on weight loss, exercise, and decreasing simple carbohydrates to help decrease the risk of diabetes. Continue Mounjaro. No refill needed.  2. Transaminitis F/u blood work with PCP.  3. Obesity with current BMI of 30.3 Heidi Payne is currently in the action stage of change. As such, her goal is to continue with weight loss efforts. She has agreed to the Category 3 Plan and the [place] + 300 calories.   Exercise goals:  As is  Behavioral modification strategies: increasing lean protein intake, meal planning and cooking strategies, and keeping healthy foods in the home.  Heidi Payne has agreed to follow-up with our clinic in 3 weeks. She was informed of the importance of frequent follow-up visits to maximize her success with intensive  lifestyle modifications for her multiple health conditions.   Objective:   Blood pressure 102/69, pulse 97, temperature 98.4 F (36.9 C), height 5\' 6"  (1.676 m), weight 188 lb (85.3 kg), last menstrual period 12/12/2018, SpO2 97 %. Body mass index is 30.34 kg/m.  General: Cooperative, alert, well developed, in no acute distress. HEENT: Conjunctivae and lids unremarkable. Cardiovascular: Regular rhythm.  Lungs: Normal work of breathing. Neurologic: No focal deficits.   Lab Results  Component Value Date   CREATININE 0.76 01/27/2021   BUN 13 01/27/2021   NA 142 01/27/2021   K 4.0 01/27/2021   CL 103 01/27/2021   CO2 25 01/27/2021   Lab Results  Component Value Date   ALT 44 (H) 01/27/2021   AST 29 01/27/2021   ALKPHOS 130 (H) 01/27/2021   BILITOT 0.4 01/27/2021   Lab Results  Component Value Date   HGBA1C 6.1 (H) 01/27/2021   Lab Results  Component Value Date   INSULIN 26.6 (H) 01/27/2021   Lab Results  Component Value Date   TSH 1.110 01/27/2021   Lab Results  Component Value Date   CHOL 183 01/27/2021   HDL 52 01/27/2021   LDLCALC 103 (H) 01/27/2021   TRIG 158 (H) 01/27/2021   CHOLHDL 5.6 12/30/2015   Lab Results  Component Value Date   VD25OH 26.2 (L) 01/27/2021   Lab Results  Component Value Date   WBC 6.2 01/27/2021   HGB 14.2 01/27/2021   HCT 42.9 01/27/2021   MCV 84 01/27/2021   PLT 296  01/27/2021    Attestation Statements:   Reviewed by clinician on day of visit: allergies, medications, problem list, medical history, surgical history, family history, social history, and previous encounter notes.  Coral Ceo, CMA, am acting as transcriptionist for Coralie Common, MD.   I have reviewed the above documentation for accuracy and completeness, and I agree with the above. - Coralie Common, MD

## 2021-07-25 ENCOUNTER — Other Ambulatory Visit: Payer: Self-pay

## 2021-07-25 ENCOUNTER — Ambulatory Visit (INDEPENDENT_AMBULATORY_CARE_PROVIDER_SITE_OTHER): Payer: No Typology Code available for payment source | Admitting: Family Medicine

## 2021-07-25 ENCOUNTER — Encounter (INDEPENDENT_AMBULATORY_CARE_PROVIDER_SITE_OTHER): Payer: Self-pay | Admitting: Family Medicine

## 2021-07-25 VITALS — BP 123/81 | HR 92 | Temp 98.2°F | Ht 66.0 in | Wt 182.0 lb

## 2021-07-25 DIAGNOSIS — E669 Obesity, unspecified: Secondary | ICD-10-CM | POA: Diagnosis not present

## 2021-07-25 DIAGNOSIS — Z9189 Other specified personal risk factors, not elsewhere classified: Secondary | ICD-10-CM | POA: Diagnosis not present

## 2021-07-25 DIAGNOSIS — Z6829 Body mass index (BMI) 29.0-29.9, adult: Secondary | ICD-10-CM | POA: Diagnosis not present

## 2021-07-25 DIAGNOSIS — R7303 Prediabetes: Secondary | ICD-10-CM

## 2021-07-25 DIAGNOSIS — E559 Vitamin D deficiency, unspecified: Secondary | ICD-10-CM | POA: Diagnosis not present

## 2021-07-25 MED ORDER — VITAMIN D (ERGOCALCIFEROL) 1.25 MG (50000 UNIT) PO CAPS: 50000.0000 [IU] | ORAL_CAPSULE | ORAL | 0 refills | Status: DC

## 2021-07-25 MED ORDER — TIRZEPATIDE 5 MG/0.5ML ~~LOC~~ SOAJ: 5.0000 mg | SUBCUTANEOUS | 0 refills | Status: DC

## 2021-07-26 NOTE — Progress Notes (Signed)
? ? ? ?Chief Complaint:  ? ?OBESITY ?Heidi Payne is here to discuss her progress with her obesity treatment plan along with follow-up of her obesity related diagnoses. Heidi Payne is on the Category 3 Plan or the [place] + 300 calories and states she is following her eating plan approximately 80% of the time. Heidi Payne states she is at the gym for 40 minutes, and walking for 30 minutes 2-3 times per week. ? ?Today's visit was #: 9 ?Starting weight: 215 lbs ?Starting date: 01/27/2021 ?Today's weight: 182 lbs ?Today's date: 07/25/2021 ?Total lbs lost to date: 51 ?Total lbs lost since last in-office visit: 6 ? ?Interim History: Heidi Payne was sick last week and finally started feeling better this past weekend. She had a cough, fever, congestion, fatigue and myalgias. With not feeling well she was still able to follow the plan. She was able to walk for 30 minutes and go to the gym. She has a trip to [place] in April. ? ?Subjective:  ? ?1. Pre-diabetes ?Heidi Payne is on Mounjaro 5 mg SubQ weekly. She denies GI side effects. She notes good carbohydrates control and satiety.  ? ?2. Vitamin D deficiency ?Heidi Payne is on prescription Vitamin D. She denies nausea, vomiting, or muscle weakness, but she notes fatigue. ? ?3. At risk of diabetes mellitus ?Heidi Payne is at higher than average risk for developing diabetes due to her obesity. ? ?Assessment/Plan:  ? ?1. Pre-diabetes ?Heidi Payne will continue Mounjaro 50 mg, and we will refill for 1 month. ? ?- tirzepatide (MOUNJARO) 5 MG/0.5ML Pen; Inject 5 mg into the skin once a week.  Dispense: 2 mL; Refill: 0 ? ?2. Vitamin D deficiency ?Heidi Payne will continue prescription Vitamin D, and we will refill for 1 month. ? ?- Vitamin D, Ergocalciferol, (DRISDOL) 1.25 MG (50000 UNIT) CAPS capsule; Take 1 capsule (50,000 Units total) by mouth every 7 (seven) days.  Dispense: 4 capsule; Refill: 0 ? ?3. At risk of diabetes mellitus ?Heidi Payne was given approximately 15 minutes of diabetic education  and counseling today. We discussed intensive lifestyle modifications today with an emphasis on weight loss as well as increasing exercise and decreasing simple carbohydrates in her diet. We also reviewed medication options with an emphasis on risk versus benefits of those discussed. ? ?Repetitive spaced learning was employed today to elicit superior memory formation and behavioral change. ? ?4. Obesity with current BMI of 29.5 ?Heidi Payne is currently in the action stage of change. As such, her goal is to continue with weight loss efforts. She has agreed to the Category 3 Plan or the [place] + 300 calories.  ? ?Exercise goals: All adults should avoid inactivity. Some physical activity is better than none, and adults who participate in any amount of physical activity gain some health benefits. ? ?Behavioral modification strategies: increasing lean protein intake, meal planning and cooking strategies, keeping healthy foods in the home, and planning for success. ? ?Heidi Payne has agreed to follow-up with our clinic in 3 to 4 weeks. She was informed of the importance of frequent follow-up visits to maximize her success with intensive lifestyle modifications for her multiple health conditions.  ? ?Objective:  ? ?Blood pressure 123/81, pulse 92, temperature 98.2 ?F (36.8 ?C), height '5\' 6"'$  (1.676 m), weight 182 lb (82.6 kg), last menstrual period 12/12/2018, SpO2 97 %. ?Body mass index is 29.38 kg/m?. ? ?General: Cooperative, alert, well developed, in no acute distress. ?HEENT: Conjunctivae and lids unremarkable. ?Cardiovascular: Regular rhythm.  ?Lungs: Normal work of breathing. ?Neurologic: No focal deficits.  ? ?  Lab Results  ?Component Value Date  ? CREATININE 0.76 01/27/2021  ? BUN 13 01/27/2021  ? NA 142 01/27/2021  ? K 4.0 01/27/2021  ? CL 103 01/27/2021  ? CO2 25 01/27/2021  ? ?Lab Results  ?Component Value Date  ? ALT 44 (H) 01/27/2021  ? AST 29 01/27/2021  ? ALKPHOS 130 (H) 01/27/2021  ? BILITOT 0.4  01/27/2021  ? ?Lab Results  ?Component Value Date  ? HGBA1C 6.1 (H) 01/27/2021  ? ?Lab Results  ?Component Value Date  ? INSULIN 26.6 (H) 01/27/2021  ? ?Lab Results  ?Component Value Date  ? TSH 1.110 01/27/2021  ? ?Lab Results  ?Component Value Date  ? CHOL 183 01/27/2021  ? HDL 52 01/27/2021  ? LDLCALC 103 (H) 01/27/2021  ? TRIG 158 (H) 01/27/2021  ? CHOLHDL 5.6 12/30/2015  ? ?Lab Results  ?Component Value Date  ? VD25OH 26.2 (L) 01/27/2021  ? ?Lab Results  ?Component Value Date  ? WBC 6.2 01/27/2021  ? HGB 14.2 01/27/2021  ? HCT 42.9 01/27/2021  ? MCV 84 01/27/2021  ? PLT 296 01/27/2021  ? ?No results found for: IRON, TIBC, FERRITIN ? ?Attestation Statements:  ? ?Reviewed by clinician on day of visit: allergies, medications, problem list, medical history, surgical history, family history, social history, and previous encounter notes. ? ? ?I, Trixie Dredge, am acting as transcriptionist for Coralie Common, MD. ? ?I have reviewed the above documentation for accuracy and completeness, and I agree with the above. Coralie Common, MD ? ? ?

## 2021-08-15 ENCOUNTER — Ambulatory Visit (INDEPENDENT_AMBULATORY_CARE_PROVIDER_SITE_OTHER): Payer: No Typology Code available for payment source | Admitting: Family Medicine

## 2021-09-05 ENCOUNTER — Encounter (INDEPENDENT_AMBULATORY_CARE_PROVIDER_SITE_OTHER): Payer: Self-pay | Admitting: Family Medicine

## 2021-09-05 ENCOUNTER — Ambulatory Visit (INDEPENDENT_AMBULATORY_CARE_PROVIDER_SITE_OTHER): Payer: No Typology Code available for payment source | Admitting: Family Medicine

## 2021-09-05 VITALS — BP 137/79 | HR 102 | Temp 98.5°F | Ht 66.0 in | Wt 178.0 lb

## 2021-09-05 DIAGNOSIS — E559 Vitamin D deficiency, unspecified: Secondary | ICD-10-CM | POA: Diagnosis not present

## 2021-09-05 DIAGNOSIS — I10 Essential (primary) hypertension: Secondary | ICD-10-CM

## 2021-09-05 DIAGNOSIS — E669 Obesity, unspecified: Secondary | ICD-10-CM

## 2021-09-05 DIAGNOSIS — Z6828 Body mass index (BMI) 28.0-28.9, adult: Secondary | ICD-10-CM

## 2021-09-05 DIAGNOSIS — R7303 Prediabetes: Secondary | ICD-10-CM | POA: Diagnosis not present

## 2021-09-05 DIAGNOSIS — Z9189 Other specified personal risk factors, not elsewhere classified: Secondary | ICD-10-CM

## 2021-09-05 MED ORDER — TIRZEPATIDE 5 MG/0.5ML ~~LOC~~ SOAJ: 5.0000 mg | SUBCUTANEOUS | 0 refills | Status: DC

## 2021-09-06 LAB — VITAMIN D 25 HYDROXY (VIT D DEFICIENCY, FRACTURES): Vit D, 25-Hydroxy: 40.5 ng/mL (ref 30.0–100.0)

## 2021-09-15 NOTE — Progress Notes (Signed)
Chief Complaint:   OBESITY Heidi Payne is here to discuss her progress with her obesity treatment plan along with follow-up of her obesity related diagnoses. Heidi Payne is on the Category 3 Plan and the Heidi Payne +300 calories and states she is following her eating plan approximately 50% of the time. Heidi Payne states she is walking 20 minutes 1-2 times per week.  Today's visit was #: 10 Starting weight: 215 lbs Starting date: 01/27/2021 Today's weight: 178 lbs Today's date: 09/05/2021 Total lbs lost to date: 37 lbs Total lbs lost since last in-office visit: 4  Interim History: Heidi Payne had a difficult few weeks and ultimately got diagnosed with a concussion.  She had to get a CT scan which thankfully was clear.  She did take a few dayof off due to symptoms, but just has not felt like herself all month. Heidi Payne has not trips or events planned for the next month.   Subjective:   1. Essential hypertension Heidi Payne's blood pressure is controlled today, she is on Cozaar and Norvasc.  Heidi Payne denies chest pain, chest pressure, and headaches.  2. Vitamin D deficiency Heidi Payne is on prescription Vitamin D, but last level 01/27/2021. Unfortunately did not get Vitamin D level at recent labs.  3. Prediabetes Heidi Payne A1c level checked by her PCP and found to be 5.1.  Heidi Payne is doing very well on Mounjaro.  4. At risk for deficient intake of food The patient is at a higher than average risk of deficient intake of food due to recent illness.   Assessment/Plan:   1. Essential hypertension Our plan today is continue current medications, no refills or medication changes needed at this time.   2. Vitamin D deficiency Our plan today is to check Vitamin D level.  - VITAMIN D 25 Hydroxy (Vit-D Deficiency, Fractures)  3. Prediabetes Our plan today is to refill Mounjaro.  See below.  - tirzepatide [facility]) 5 MG/0.5ML Pen; Inject 5 mg into the skin once a week.  Dispense: 2 mL;  Refill: 0  4. At risk for deficient intake of food Heidi Payne was given approximately 15 minutes of deficient intake of food prevention counseling today. Heidi Payne is at risk for eating too few calories based on current food recall. She was encouraged to focus on meeting caloric and protein goals according to her recommended meal plan.   5. Obesity with current BMI of 28.9 Heidi Payne is currently in the action stage of change. As such, her goal is to continue with weight loss efforts. She has agreed to the Category 3 Plan and the [place] Plan+ 300 calories.    Exercise goals:  As is.  Behavioral modification strategies: increasing lean protein intake, meal planning and cooking strategies, keeping healthy foods in the home, and planning for success.  Heidi Payne has agreed to follow-up with our clinic in 4 weeks. She was informed of the importance of frequent follow-up visits to maximize her success with intensive lifestyle modifications for her multiple health conditions.   Heidi Payne was informed we would discuss her lab results at her next visit unless there is a critical issue that needs to be addressed sooner. Heidi Payne agreed to keep her next visit at the agreed upon time to discuss these results. Objective:   Blood pressure 137/79, pulse (!) 102, temperature 98.5 F (36.9 C), height '5\' 6"'$  (1.676 m), weight 178 lb (80  Objective:   Blood pressure 137/79, pulse (!) 102, temperature 98.5 F (36.9 C), height '5\' 6"'$  (1.676 m), weight 178 lb (80.7  kg), last menstrual period 12/12/2018, SpO2 98 %. Body mass index is 28.73 kg/m.  General: Cooperative, alert, well developed, in no acute distress. HEENT: Conjunctivae and lids unremarkable. Cardiovascular: Regular rhythm.  Lungs: Normal work of breathing. Neurologic: No focal deficits.   Lab Results  Component Value Date   CREATININE 0.76 01/27/2021   BUN 13 01/27/2021   NA 142 01/27/2021   K 4.0 01/27/2021   CL 103 01/27/2021   CO2 25  01/27/2021   Lab Results  Component Value Date   ALT 44 (H) 01/27/2021   AST 29 01/27/2021   ALKPHOS 130 (H) 01/27/2021   BILITOT 0.4 01/27/2021   Lab Results  Component Value Date   HGBA1C 6.1 (H) 01/27/2021   Lab Results  Component Value Date   INSULIN 26.6 (H) 01/27/2021   Lab Results  Component Value Date   TSH 1.110 01/27/2021   Lab Results  Component Value Date   CHOL 183 01/27/2021   HDL 52 01/27/2021   LDLCALC 103 (H) 01/27/2021   TRIG 158 (H) 01/27/2021   CHOLHDL 5.6 12/30/2015   Lab Results  Component Value Date   VD25OH 40.5 09/05/2021   VD25OH 26.2 (L) 01/27/2021   Lab Results  Component Value Date   WBC 6.2 01/27/2021   HGB 14.2 01/27/2021   HCT 42.9 01/27/2021   MCV 84 01/27/2021   PLT 296 01/27/2021   No results found for: IRON, TIBC, FERRITIN  Attestation Statements:   Reviewed by clinician on day of visit: allergies, medications, problem list, medical history, surgical history, family history, social history, and previous encounter notes. I, Davy Pique, am acting as transcriptionist for Coralie Common, MD.  I have reviewed the above documentation for accuracy and completeness, and I agree with the above. - Coralie Common, MD

## 2021-09-29 ENCOUNTER — Encounter (INDEPENDENT_AMBULATORY_CARE_PROVIDER_SITE_OTHER): Payer: Self-pay | Admitting: Family Medicine

## 2021-09-29 ENCOUNTER — Ambulatory Visit (INDEPENDENT_AMBULATORY_CARE_PROVIDER_SITE_OTHER): Payer: No Typology Code available for payment source | Admitting: Family Medicine

## 2021-09-29 VITALS — BP 137/86 | HR 88 | Temp 98.0°F | Ht 66.0 in | Wt 177.0 lb

## 2021-09-29 DIAGNOSIS — F419 Anxiety disorder, unspecified: Secondary | ICD-10-CM | POA: Diagnosis not present

## 2021-09-29 DIAGNOSIS — E669 Obesity, unspecified: Secondary | ICD-10-CM

## 2021-09-29 DIAGNOSIS — F32A Depression, unspecified: Secondary | ICD-10-CM

## 2021-09-29 DIAGNOSIS — Z6828 Body mass index (BMI) 28.0-28.9, adult: Secondary | ICD-10-CM

## 2021-09-29 DIAGNOSIS — R7303 Prediabetes: Secondary | ICD-10-CM | POA: Diagnosis not present

## 2021-09-29 DIAGNOSIS — Z9189 Other specified personal risk factors, not elsewhere classified: Secondary | ICD-10-CM

## 2021-09-29 DIAGNOSIS — E559 Vitamin D deficiency, unspecified: Secondary | ICD-10-CM | POA: Diagnosis not present

## 2021-09-29 MED ORDER — TIRZEPATIDE 5 MG/0.5ML ~~LOC~~ SOAJ: 5.0000 mg | SUBCUTANEOUS | 0 refills | Status: DC

## 2021-09-29 MED ORDER — VITAMIN D (ERGOCALCIFEROL) 1.25 MG (50000 UNIT) PO CAPS: 50000.0000 [IU] | ORAL_CAPSULE | ORAL | 0 refills | Status: DC

## 2021-10-06 NOTE — Progress Notes (Signed)
Chief Complaint:   OBESITY Heidi Payne is here to discuss her progress with her obesity treatment plan along with follow-up of her obesity related diagnoses. Heidi Payne is on the Category 3 Plan and the [place] + 300 and states she is following her eating plan approximately 80% of the time. Heidi Payne states she is walking 2 miles 3 times per week.  Today's visit was #: 11 Starting weight: 215 lbs Starting date: 01/27/2021 Today's weight: 177 lbs Today's date: 09/29/2021 Total lbs lost to date: 38 Total lbs lost since last in-office visit: 1  Interim History: Heidi Payne has quite a bit of familial stress. She is trying to plan her daughter's graduation but her 34 y.o daughter is taking some of the planning from her. She has not been stress eating but often actually stress not eating. She has been sleeping poorly and feeling increasingly anxious.   Subjective:   1. Vitamin D deficiency Heidi Payne is currently on prescription Vitamin D. Denies any nausea, vomiting or muscle weakness. She notes fatigue.   2. Prediabetes Heidi Payne is currently taking Mounjaro 5 mg. Her last A1c at 6.1 and insulin 26.6. Decrease in hunger cues on Mounjaro.  3. Anxiety and depression Heidi Payne is currently taking Cymbalta and Wellbutrin. Denies suicidal ideas, and homicidal ideas.  4. At risk for depression Heidi Payne is at elevated risk of depression due to one or more of the following: family history, significant life stressors, medical conditions and/or poor nutrition.  Assessment/Plan:   1. Vitamin D deficiency We will refill Vit D 50K IU once weekly for 1 month with no refills.  -Refill Vitamin D, Ergocalciferol, (DRISDOL) 1.25 MG (50000 UNIT) CAPS capsule; Take 1 capsule (50,000 Units total) by mouth every 7 (seven) days.  Dispense: 4 capsule; Refill: 0  2. Prediabetes We will refill Mounjaro 5 mg subcutaneous once weekly for 1 month with no refills.  -Refill tirzepatide (MOUNJARO) 5 MG/0.5ML  Pen; Inject 5 mg into the skin once a week.  Dispense: 2 mL; Refill: 0  3. Anxiety and depression Heidi Payne is to reach out to former therapist for counseling for familiar stress.  4. At risk for depression Heidi Payne was given approximately 15 minutes of depression risk counseling today. She has risk factors for depression due to familiar stress. We discussed the importance of a healthy work life balance, a healthy relationship with food and a good support system.  Repetitive spaced learning was employed today to elicit superior memory formation and behavioral change.  5. Obesity with current BMI of 28.7 Heidi Payne is currently in the action stage of change. As such, her goal is to continue with weight loss efforts. She has agreed to the Category 3 Plan and the [place] + 300.  Exercise goals: As is.  Behavioral modification strategies: increasing lean protein intake and keeping healthy foods in the home.  Heidi Payne has agreed to follow-up with our clinic in 4 weeks. She was informed of the importance of frequent follow-up visits to maximize her success with intensive lifestyle modifications for her multiple health conditions.   Objective:   Blood pressure 137/86, pulse 88, temperature 98 F (36.7 C), height '5\' 6"'$  (1.676 m), weight 177 lb (80.3 kg), last menstrual period 12/12/2018, SpO2 99 %. Body mass index is 28.57 kg/m.  General: Cooperative, alert, well developed, in no acute distress. HEENT: Conjunctivae and lids unremarkable. Cardiovascular: Regular rhythm.  Lungs: Normal work of breathing. Neurologic: No focal deficits.   Lab Results  Component Value Date   CREATININE  0.76 01/27/2021   BUN 13 01/27/2021   NA 142 01/27/2021   K 4.0 01/27/2021   CL 103 01/27/2021   CO2 25 01/27/2021   Lab Results  Component Value Date   ALT 44 (H) 01/27/2021   AST 29 01/27/2021   ALKPHOS 130 (H) 01/27/2021   BILITOT 0.4 01/27/2021   Lab Results  Component Value Date   HGBA1C  6.1 (H) 01/27/2021   Lab Results  Component Value Date   INSULIN 26.6 (H) 01/27/2021   Lab Results  Component Value Date   TSH 1.110 01/27/2021   Lab Results  Component Value Date   CHOL 183 01/27/2021   HDL 52 01/27/2021   LDLCALC 103 (H) 01/27/2021   TRIG 158 (H) 01/27/2021   CHOLHDL 5.6 12/30/2015   Lab Results  Component Value Date   VD25OH 40.5 09/05/2021   VD25OH 26.2 (L) 01/27/2021   Lab Results  Component Value Date   WBC 6.2 01/27/2021   HGB 14.2 01/27/2021   HCT 42.9 01/27/2021   MCV 84 01/27/2021   PLT 296 01/27/2021   No results found for: IRON, TIBC, FERRITIN  Attestation Statements:   Reviewed by clinician on day of visit: allergies, medications, problem list, medical history, surgical history, family history, social history, and previous encounter notes.  I, Elnora Morrison, RMA am acting as transcriptionist for Coralie Common, MD.  I have reviewed the above documentation for accuracy and completeness, and I agree with the above. - Coralie Common, MD

## 2021-10-20 ENCOUNTER — Ambulatory Visit (INDEPENDENT_AMBULATORY_CARE_PROVIDER_SITE_OTHER): Payer: No Typology Code available for payment source | Admitting: Family Medicine

## 2021-10-20 ENCOUNTER — Encounter (INDEPENDENT_AMBULATORY_CARE_PROVIDER_SITE_OTHER): Payer: Self-pay | Admitting: Family Medicine

## 2021-10-20 VITALS — BP 117/74 | HR 102 | Temp 98.1°F | Ht 66.0 in | Wt 177.0 lb

## 2021-10-20 DIAGNOSIS — R632 Polyphagia: Secondary | ICD-10-CM

## 2021-10-20 DIAGNOSIS — E559 Vitamin D deficiency, unspecified: Secondary | ICD-10-CM | POA: Diagnosis not present

## 2021-10-20 DIAGNOSIS — E669 Obesity, unspecified: Secondary | ICD-10-CM | POA: Diagnosis not present

## 2021-10-20 DIAGNOSIS — Z6828 Body mass index (BMI) 28.0-28.9, adult: Secondary | ICD-10-CM

## 2021-10-20 DIAGNOSIS — Z9189 Other specified personal risk factors, not elsewhere classified: Secondary | ICD-10-CM

## 2021-10-20 MED ORDER — LOMAIRA 8 MG PO TABS: 8.0000 mg | ORAL_TABLET | Freq: Every day | ORAL | 0 refills | Status: DC

## 2021-10-24 NOTE — Progress Notes (Unsigned)
Chief Complaint:   OBESITY Heidi Payne is here to discuss her progress with her obesity treatment plan along with follow-up of her obesity related diagnoses. Heidi Payne is on the Category 3 Plan and the [place] +300 and states she is following her eating plan approximately 90% of the time. Heidi Payne states she is air stepping 20 minutes 4-5 times per week.  Today's visit was #: 12 Starting weight: 215 lbs Starting date: 01/27/2021 Today's weight: 177 lbs Today's date: 10/20/2021 Total lbs lost to date: 38 lbs Total lbs lost since last in-office visit: 0  Interim History: Heidi Payne has been just preparing for her daughter to graduate over the last few weeks. Still having some familiar dynamics that are stress provoking. She has done better food choice wise and is trying to stay protein conscious.  Subjective:   1. Vitamin D deficiency Heidi Payne is not currently taking Vit D. Denies any nausea, vomiting or muscle weakness. She notes fatigue. Her last Vit D level of 40.5  2. Polyphagia Heidi Payne is already taking Topamax. Her blood pressure is well controlled. PDMP and Controlled Substance Contract signed.  3. At risk for side effect of medication Heidi Payne is at risk for drug side effects due to medication change..  Assessment/Plan:   1. Vitamin D deficiency We will refill Vit D 50,000 IU once a week for 1 month with 0 refills.  2. Polyphagia Start Lomaira 8 mg daily for 1 month with 0 refills. ('4mg'$  1/2 tab in the AM for 2 weeks then increase to 8 mg for the next 2 weeks).  -Start Phentermine HCl (LOMAIRA) 8 MG TABS; Take 8 mg by mouth daily.  Dispense: 30 tablet; Refill: 0  3. At risk for side effect of medication Heidi Payne was given approximately 15 minutes of drug side effect counseling today.  We discussed side effect possibility and risk versus benefits. Heidi Payne agreed to the medication and will contact this office if these side effects are intolerable.  Repetitive  spaced learning was employed today to elicit superior memory formation and behavioral change.  4. Obesity with current BMI of 28.7 Heidi Payne is currently in the action stage of change. As such, her goal is to continue with weight loss efforts. She has agreed to the Category 3 Plan and the [place] +300.   Exercise goals: As is.  Behavioral modification strategies: increasing lean protein intake, meal planning and cooking strategies, keeping healthy foods in the home, and planning for success.  Heidi Payne has agreed to follow-up with our clinic in 4 weeks. She was informed of the importance of frequent follow-up visits to maximize her success with intensive lifestyle modifications for her multiple health conditions.   Objective:   Blood pressure 117/74, pulse (!) 102, temperature 98.1 F (36.7 C), height '5\' 6"'$  (1.676 m), weight 177 lb (80.3 kg), last menstrual period 12/12/2018, SpO2 98 %. Body mass index is 28.57 kg/m.  General: Cooperative, alert, well developed, in no acute distress. HEENT: Conjunctivae and lids unremarkable. Cardiovascular: Regular rhythm.  Lungs: Normal work of breathing. Neurologic: No focal deficits.   Lab Results  Component Value Date   CREATININE 0.76 01/27/2021   BUN 13 01/27/2021   NA 142 01/27/2021   K 4.0 01/27/2021   CL 103 01/27/2021   CO2 25 01/27/2021   Lab Results  Component Value Date   ALT 44 (H) 01/27/2021   AST 29 01/27/2021   ALKPHOS 130 (H) 01/27/2021   BILITOT 0.4 01/27/2021   Lab Results  Component  Value Date   HGBA1C 6.1 (H) 01/27/2021   Lab Results  Component Value Date   INSULIN 26.6 (H) 01/27/2021   Lab Results  Component Value Date   TSH 1.110 01/27/2021   Lab Results  Component Value Date   CHOL 183 01/27/2021   HDL 52 01/27/2021   LDLCALC 103 (H) 01/27/2021   TRIG 158 (H) 01/27/2021   CHOLHDL 5.6 12/30/2015   Lab Results  Component Value Date   VD25OH 40.5 09/05/2021   VD25OH 26.2 (L) 01/27/2021    Lab Results  Component Value Date   WBC 6.2 01/27/2021   HGB 14.2 01/27/2021   HCT 42.9 01/27/2021   MCV 84 01/27/2021   PLT 296 01/27/2021   No results found for: "IRON", "TIBC", "FERRITIN"  Attestation Statements:   Reviewed by clinician on day of visit: allergies, medications, problem list, medical history, surgical history, family history, social history, and previous encounter notes.  I, Elnora Morrison, RMA am acting as transcriptionist for Coralie Common, MD.  I have reviewed the above documentation for accuracy and completeness, and I agree with the above. -  ***

## 2021-10-26 ENCOUNTER — Telehealth (INDEPENDENT_AMBULATORY_CARE_PROVIDER_SITE_OTHER): Payer: Self-pay | Admitting: Family Medicine

## 2021-10-26 ENCOUNTER — Encounter (INDEPENDENT_AMBULATORY_CARE_PROVIDER_SITE_OTHER): Payer: Self-pay

## 2021-10-26 NOTE — Telephone Encounter (Signed)
Dr. Jearld Shines - Prior authorization approved for Phentermine HCl [facility]). Effective: 10/25/2021 to 01/25/2022. Patient sent approval message via mychart.

## 2021-11-03 MED ORDER — VITAMIN D (ERGOCALCIFEROL) 1.25 MG (50000 UNIT) PO CAPS: 50000.0000 [IU] | ORAL_CAPSULE | ORAL | 0 refills | Status: DC

## 2021-11-17 ENCOUNTER — Ambulatory Visit (INDEPENDENT_AMBULATORY_CARE_PROVIDER_SITE_OTHER): Payer: No Typology Code available for payment source | Admitting: Family Medicine

## 2021-11-17 ENCOUNTER — Encounter (INDEPENDENT_AMBULATORY_CARE_PROVIDER_SITE_OTHER): Payer: Self-pay | Admitting: Family Medicine

## 2021-11-17 VITALS — BP 122/83 | HR 91 | Temp 98.5°F | Ht 66.0 in | Wt 180.0 lb

## 2021-11-17 DIAGNOSIS — R632 Polyphagia: Secondary | ICD-10-CM

## 2021-11-17 DIAGNOSIS — Z6829 Body mass index (BMI) 29.0-29.9, adult: Secondary | ICD-10-CM

## 2021-11-17 DIAGNOSIS — E559 Vitamin D deficiency, unspecified: Secondary | ICD-10-CM

## 2021-11-17 DIAGNOSIS — E669 Obesity, unspecified: Secondary | ICD-10-CM

## 2021-11-17 DIAGNOSIS — Z6834 Body mass index (BMI) 34.0-34.9, adult: Secondary | ICD-10-CM

## 2021-11-17 MED ORDER — VITAMIN D (ERGOCALCIFEROL) 1.25 MG (50000 UNIT) PO CAPS: 50000.0000 [IU] | ORAL_CAPSULE | ORAL | 0 refills | Status: DC

## 2021-11-17 MED ORDER — LOMAIRA 8 MG PO TABS: 12.0000 mg | ORAL_TABLET | Freq: Every day | ORAL | 0 refills | Status: DC

## 2021-11-17 MED ORDER — TOPIRAMATE 25 MG PO TABS: 75.0000 mg | ORAL_TABLET | Freq: Every day | ORAL | 0 refills | Status: DC

## 2021-11-21 NOTE — Progress Notes (Signed)
Chief Complaint:   OBESITY Heidi Payne is here to discuss her progress with her obesity treatment plan along with follow-up of her obesity related diagnoses. Heidi Payne is on the Category 3 Plan and the [place] +300 and states she is following her eating plan approximately 50% of the time. Heidi Payne states she is exercising 0 minutes 0 times per week.  Today's visit was #: 85 Starting weight: 215 lbs Starting date: 01/27/2021 Today's weight: 180 lbs Today's date: 11/17/2021 Total lbs lost to date: 35 lbs Total lbs lost since last in-office visit: 0  Interim History: Harmony got married in the last few weeks! She is very happy with her decision to do this. She is moving out of her rental house. She has not followed the plan as strictly due to this move. Planning to be able to get move on track this upcoming week.  Subjective:   1. Polyphagia Heidi Payne started combo Lamaria and Topiramate. Her initial wt 177---going to satrt 1st treatment dose week of July 10th. She needs to be down 9 lbs by mid Oct; PDMP (prescription drug monitoring program) checked.  2. Vitamin D deficiency Heidi Payne is currently taking prescription Vit D 50,000 IU once a week. Her last Vit D level of 40.5. She notes fatigue.  Assessment/Plan:   1. Polyphagia We will refill and increase Topiramate to 75 mg daily and Lomaira to 12 mg daily.  -Refill/increase Topiramate (TOPAMAX) 25 MG tablet; Take 3 tablets (75 mg total) by mouth daily.  Dispense: 90 tablet; Refill: 0  -Refill/increase Phentermine HCl (LOMAIRA) 8 MG TABS; Take 12 mg by mouth daily.  Dispense: 45 tablet; Refill: 0  2. Vitamin D deficiency We will refill Vit d 50,000 IU once a week for 1 month with 0 refills.  -Refill Vitamin D, Ergocalciferol, (DRISDOL) 1.25 MG (50000 UNIT) CAPS capsule; Take 1 capsule (50,000 Units total) by mouth every 7 (seven) days.  Dispense: 4 capsule; Refill: 0  3. Obesity with current BMI of 29.1 Salah is  currently in the action stage of change. As such, her goal is to continue with weight loss efforts. She has agreed to the Category 3 Plan and the [place] +300  Exercise goals: As is.   Heidi Payne will get back to activity. She was previously doing.  Behavioral modification strategies: increasing lean protein intake, meal planning and cooking strategies, keeping healthy foods in the home, and planning for success.  Heidi Payne has agreed to follow-up with our clinic in 4 weeks. She was informed of the importance of frequent follow-up visits to maximize her success with intensive lifestyle modifications for her multiple health conditions.   Objective:   Blood pressure 122/83, pulse 91, temperature 98.5 F (36.9 C), height '5\' 6"'$  (1.676 m), weight 180 lb (81.6 kg), last menstrual period 12/12/2018, SpO2 98 %. Body mass index is 29.05 kg/m.  General: Cooperative, alert, well developed, in no acute distress. HEENT: Conjunctivae and lids unremarkable. Cardiovascular: Regular rhythm.  Lungs: Normal work of breathing. Neurologic: No focal deficits.   Lab Results  Component Value Date   CREATININE 0.76 01/27/2021   BUN 13 01/27/2021   NA 142 01/27/2021   K 4.0 01/27/2021   CL 103 01/27/2021   CO2 25 01/27/2021   Lab Results  Component Value Date   ALT 44 (H) 01/27/2021   AST 29 01/27/2021   ALKPHOS 130 (H) 01/27/2021   BILITOT 0.4 01/27/2021   Lab Results  Component Value Date   HGBA1C 6.1 (H) 01/27/2021  Lab Results  Component Value Date   INSULIN 26.6 (H) 01/27/2021   Lab Results  Component Value Date   TSH 1.110 01/27/2021   Lab Results  Component Value Date   CHOL 183 01/27/2021   HDL 52 01/27/2021   LDLCALC 103 (H) 01/27/2021   TRIG 158 (H) 01/27/2021   CHOLHDL 5.6 12/30/2015   Lab Results  Component Value Date   VD25OH 40.5 09/05/2021   VD25OH 26.2 (L) 01/27/2021   Lab Results  Component Value Date   WBC 6.2 01/27/2021   HGB 14.2 01/27/2021   HCT  42.9 01/27/2021   MCV 84 01/27/2021   PLT 296 01/27/2021   No results found for: "IRON", "TIBC", "FERRITIN"  Attestation Statements:   Reviewed by clinician on day of visit: allergies, medications, problem list, medical history, surgical history, family history, social history, and previous encounter notes.  I, Elnora Morrison, RMA am acting as transcriptionist for Coralie Common, MD.  I have reviewed the above documentation for accuracy and completeness, and I agree with the above. - Coralie Common, MD

## 2021-12-17 ENCOUNTER — Other Ambulatory Visit (INDEPENDENT_AMBULATORY_CARE_PROVIDER_SITE_OTHER): Payer: Self-pay | Admitting: Family Medicine

## 2021-12-17 DIAGNOSIS — R632 Polyphagia: Secondary | ICD-10-CM

## 2021-12-20 ENCOUNTER — Ambulatory Visit (INDEPENDENT_AMBULATORY_CARE_PROVIDER_SITE_OTHER): Payer: No Typology Code available for payment source | Admitting: Family Medicine

## 2021-12-20 ENCOUNTER — Encounter (INDEPENDENT_AMBULATORY_CARE_PROVIDER_SITE_OTHER): Payer: Self-pay | Admitting: Family Medicine

## 2021-12-20 VITALS — BP 128/82 | HR 99 | Temp 98.4°F | Ht 66.0 in | Wt 177.0 lb

## 2021-12-20 DIAGNOSIS — R7303 Prediabetes: Secondary | ICD-10-CM

## 2021-12-20 DIAGNOSIS — R632 Polyphagia: Secondary | ICD-10-CM | POA: Diagnosis not present

## 2021-12-20 DIAGNOSIS — Z6828 Body mass index (BMI) 28.0-28.9, adult: Secondary | ICD-10-CM | POA: Diagnosis not present

## 2021-12-20 DIAGNOSIS — E669 Obesity, unspecified: Secondary | ICD-10-CM | POA: Diagnosis not present

## 2021-12-20 MED ORDER — LOMAIRA 8 MG PO TABS: 12.0000 mg | ORAL_TABLET | Freq: Every day | ORAL | 0 refills | Status: DC

## 2021-12-20 MED ORDER — TOPIRAMATE 25 MG PO TABS: 75.0000 mg | ORAL_TABLET | Freq: Every day | ORAL | 0 refills | Status: DC

## 2021-12-21 ENCOUNTER — Encounter (INDEPENDENT_AMBULATORY_CARE_PROVIDER_SITE_OTHER): Payer: Self-pay

## 2021-12-22 NOTE — Progress Notes (Signed)
Chief Complaint:   OBESITY Heidi Payne is here to discuss her progress with her obesity treatment plan along with follow-up of her obesity related diagnoses. Heidi Payne is on the Category 3 Plan and the Heidi Payne and states she is following her eating plan approximately 100% of the time. Heidi Payne states she is walking 40 minutes 4-5 times per week.  Today's visit was #: 14 Starting weight: 215 lbs Starting date: 01/27/2021 Today's weight: 177 lbs Today's date: 12/20/2021 Total lbs lost to date: 38 lbs Total lbs lost since last in-office visit: 3  Interim History: Heidi Payne and her husband have really dialed in nutrition and exercise over last few weeks. Heidi Payne voices daughter is leaving for college tomorrow. She questions if she is eating too much as she often feels full (initial RMR 2059).  Subjective:   1. Polyphagia Heidi Payne started combo Phentermine/Topiramate 1st treatment dose 11/18/21, down 3 lbs since then. PDMP (prescription drug monitoring program) reviewed.  2. Prediabetes Heidi Payne's last A1c was 6.1, insulin was 26.6. Previously on Mounjaro but no longer covered.  Assessment/Plan:   1. Polyphagia We will refill Phentermine 12 mg by mouth daily AND Topiramate 75 mg by mouth daily for 1 month with 0 refills.  -Refill Phentermine HCl (LOMAIRA) 8 MG TABS; Take 12 mg by mouth daily.  Dispense: 45 tablet; Refill: 0  -Refill topiramate (TOPAMAX) 25 MG tablet; Take 3 tablets (75 mg total) by mouth daily.  Dispense: 90 tablet; Refill: 0  2. Prediabetes Labs with PCP at the end of Sept.  3. Obesity with current BMI of 28.7 Heidi Payne is currently in the action stage of change. As such, her goal is to continue with weight loss efforts. She has agreed to the Category 3 Plan.   Exercise goals: All adults should avoid inactivity. Some physical activity is better than none, and adults who participate in any amount of physical activity gain some health benefits.  I stress the  importance of resistance training.  Behavioral modification strategies: increasing lean protein intake, meal planning and cooking strategies, keeping healthy foods in the home, and planning for success.  Heidi Payne has agreed to follow-up with our clinic in 4 weeks. She was informed of the importance of frequent follow-up visits to maximize her success with intensive lifestyle modifications for her multiple health conditions.   Objective:   Blood pressure 128/82, pulse 99, temperature 98.4 F (36.9 C), height '5\' 6"'$  (1.676 m), weight 177 lb (80.3 kg), last menstrual period 12/12/2018, SpO2 100 %. Body mass index is 28.57 kg/m.  General: Cooperative, alert, well developed, in no acute distress. HEENT: Conjunctivae and lids unremarkable. Cardiovascular: Regular rhythm.  Lungs: Normal work of breathing. Neurologic: No focal deficits.   Lab Results  Component Value Date   CREATININE 0.76 01/27/2021   BUN 13 01/27/2021   NA 142 01/27/2021   K 4.0 01/27/2021   CL 103 01/27/2021   CO2 25 01/27/2021   Lab Results  Component Value Date   ALT 44 (H) 01/27/2021   AST 29 01/27/2021   ALKPHOS 130 (H) 01/27/2021   BILITOT 0.4 01/27/2021   Lab Results  Component Value Date   HGBA1C 6.1 (H) 01/27/2021   Lab Results  Component Value Date   INSULIN 26.6 (H) 01/27/2021   Lab Results  Component Value Date   TSH 1.110 01/27/2021   Lab Results  Component Value Date   CHOL 183 01/27/2021   HDL 52 01/27/2021   LDLCALC 103 (H) 01/27/2021   TRIG 158 (  H) 01/27/2021   CHOLHDL 5.6 12/30/2015   Lab Results  Component Value Date   VD25OH 40.5 09/05/2021   VD25OH 26.2 (L) 01/27/2021   Lab Results  Component Value Date   WBC 6.2 01/27/2021   HGB 14.2 01/27/2021   HCT 42.9 01/27/2021   MCV 84 01/27/2021   PLT 296 01/27/2021   No results found for: "IRON", "TIBC", "FERRITIN"  Attestation Statements:   Reviewed by clinician on day of visit: allergies, medications, problem list,  medical history, surgical history, family history, social history, and previous encounter notes.  I, Heidi Payne, RMA am acting as transcriptionist for Heidi Common, MD.  I have reviewed the above documentation for accuracy and completeness, and I agree with the above. - Heidi Common, MD

## 2022-01-17 ENCOUNTER — Encounter (INDEPENDENT_AMBULATORY_CARE_PROVIDER_SITE_OTHER): Payer: Self-pay | Admitting: Family Medicine

## 2022-01-17 ENCOUNTER — Ambulatory Visit (INDEPENDENT_AMBULATORY_CARE_PROVIDER_SITE_OTHER): Payer: No Typology Code available for payment source | Admitting: Family Medicine

## 2022-01-17 VITALS — BP 120/77 | HR 109 | Temp 98.5°F | Ht 66.0 in | Wt 180.0 lb

## 2022-01-17 DIAGNOSIS — R632 Polyphagia: Secondary | ICD-10-CM | POA: Diagnosis not present

## 2022-01-17 DIAGNOSIS — E669 Obesity, unspecified: Secondary | ICD-10-CM

## 2022-01-17 DIAGNOSIS — E559 Vitamin D deficiency, unspecified: Secondary | ICD-10-CM | POA: Diagnosis not present

## 2022-01-17 DIAGNOSIS — Z6829 Body mass index (BMI) 29.0-29.9, adult: Secondary | ICD-10-CM

## 2022-01-17 MED ORDER — TOPIRAMATE 25 MG PO TABS: 75.0000 mg | ORAL_TABLET | Freq: Every day | ORAL | 0 refills | Status: DC

## 2022-01-17 MED ORDER — VITAMIN D (ERGOCALCIFEROL) 1.25 MG (50000 UNIT) PO CAPS: 50000.0000 [IU] | ORAL_CAPSULE | ORAL | 0 refills | Status: DC

## 2022-01-17 MED ORDER — LOMAIRA 8 MG PO TABS: 12.0000 mg | ORAL_TABLET | Freq: Every day | ORAL | 0 refills | Status: DC

## 2022-01-19 NOTE — Progress Notes (Signed)
Chief Complaint:   OBESITY Heidi Payne is here to discuss her progress with her obesity treatment plan along with follow-up of her obesity related diagnoses. Heidi Payne is on the Category 3 Plan and states she is following her eating plan approximately 85% of the time. Heidi Payne states she is walking/resistance 20-30 minutes 2-4 times per week.  Today's visit was #: 15 Starting weight: 215 lbs Starting date: 01/27/2021 Today's weight: 180 lbs Today's date: 01/17/2022 Total lbs lost to date: 35 lbs Total lbs lost since last in-office visit: 0  Interim History: Heidi Payne celebrated a few birthdays since last appointment. Feels less focused today. Having a little country type wedding on Oct 7th for close family and friends. Has not been walking as much and has been trying to do more resistance training.  Subjective:   1. Vitamin D deficiency Heidi Payne's Vit D level of 40.5. Denies any nausea, vomiting or muscle weakness. She notes fatigue.  2. Heidi Payne on 12/75 of Phent/Topiramate. No side effects noted.  Assessment/Plan:   1. Vitamin D deficiency We will refill Vit D 50k IU once a week for 1 month with 0 refills. Will obtain labs with PCP.  -Refill Vitamin D, Ergocalciferol, (DRISDOL) 1.25 MG (50000 UNIT) CAPS capsule; Take 1 capsule (50,000 Units total) by mouth every 7 (seven) days.  Dispense: 4 capsule; Refill: 0  2. Polyphagia We will refill Topiramate 75 mg by mouth daily AND Phentermine 12 mg by mouth daily for 1 month with 0 refills.  -Refill topiramate (TOPAMAX) 25 MG tablet; Take 3 tablets (75 mg total) by mouth daily.  Dispense: 90 tablet; Refill: 0  -Refill Phentermine HCl (LOMAIRA) 8 MG TABS; Take 12 mg by mouth daily.  Dispense: 45 tablet; Refill: 0  3. Obesity with current BMI of 29.1 Heidi Payne is currently in the action stage of change. As such, her goal is to continue with weight loss efforts. She has agreed to the Category 3 Plan.   Exercise goals:  Increase walking frequency and continue resistance.  Behavioral modification strategies: increasing lean protein intake, meal planning and cooking strategies, keeping healthy foods in the home, and planning for success.  Heidi Payne has agreed to follow-up with our clinic in 4 weeks. She was informed of the importance of frequent follow-up visits to maximize her success with intensive lifestyle modifications for her multiple health conditions.   Objective:   Blood pressure 120/77, pulse (!) 109, temperature 98.5 F (36.9 C), height '5\' 6"'$  (1.676 m), weight 180 lb (81.6 kg), last menstrual period 12/12/2018, SpO2 98 %. Body mass index is 29.05 kg/m.  General: Cooperative, alert, well developed, in no acute distress. HEENT: Conjunctivae and lids unremarkable. Cardiovascular: Regular rhythm.  Lungs: Normal work of breathing. Neurologic: No focal deficits.   Lab Results  Component Value Date   CREATININE 0.76 01/27/2021   BUN 13 01/27/2021   NA 142 01/27/2021   K 4.0 01/27/2021   CL 103 01/27/2021   CO2 25 01/27/2021   Lab Results  Component Value Date   ALT 44 (H) 01/27/2021   AST 29 01/27/2021   ALKPHOS 130 (H) 01/27/2021   BILITOT 0.4 01/27/2021   Lab Results  Component Value Date   HGBA1C 6.1 (H) 01/27/2021   Lab Results  Component Value Date   INSULIN 26.6 (H) 01/27/2021   Lab Results  Component Value Date   TSH 1.110 01/27/2021   Lab Results  Component Value Date   CHOL 183 01/27/2021   HDL 52 01/27/2021  LDLCALC 103 (H) 01/27/2021   TRIG 158 (H) 01/27/2021   CHOLHDL 5.6 12/30/2015   Lab Results  Component Value Date   VD25OH 40.5 09/05/2021   VD25OH 26.2 (L) 01/27/2021   Lab Results  Component Value Date   WBC 6.2 01/27/2021   HGB 14.2 01/27/2021   HCT 42.9 01/27/2021   MCV 84 01/27/2021   PLT 296 01/27/2021   No results found for: "IRON", "TIBC", "FERRITIN"  Attestation Statements:   Reviewed by clinician on day of visit: allergies,  medications, problem list, medical history, surgical history, family history, social history, and previous encounter notes.  I, Elnora Morrison, RMA am acting as transcriptionist for Coralie Common, MD.  I have reviewed the above documentation for accuracy and completeness, and I agree with the above. - Coralie Common, MD

## 2022-01-20 ENCOUNTER — Other Ambulatory Visit (INDEPENDENT_AMBULATORY_CARE_PROVIDER_SITE_OTHER): Payer: Self-pay | Admitting: Family Medicine

## 2022-01-20 DIAGNOSIS — R632 Polyphagia: Secondary | ICD-10-CM

## 2022-01-22 IMAGING — MG DIGITAL SCREENING BILAT W/ TOMO W/ CAD
6 of 10 series · 6 of 30 positions shown · non-contrast
Comparison: Previous exam(s).

CLINICAL DATA: Screening.

EXAM:
DIGITAL SCREENING BILATERAL MAMMOGRAM WITH TOMO AND CAD

[R CC synth-2D]
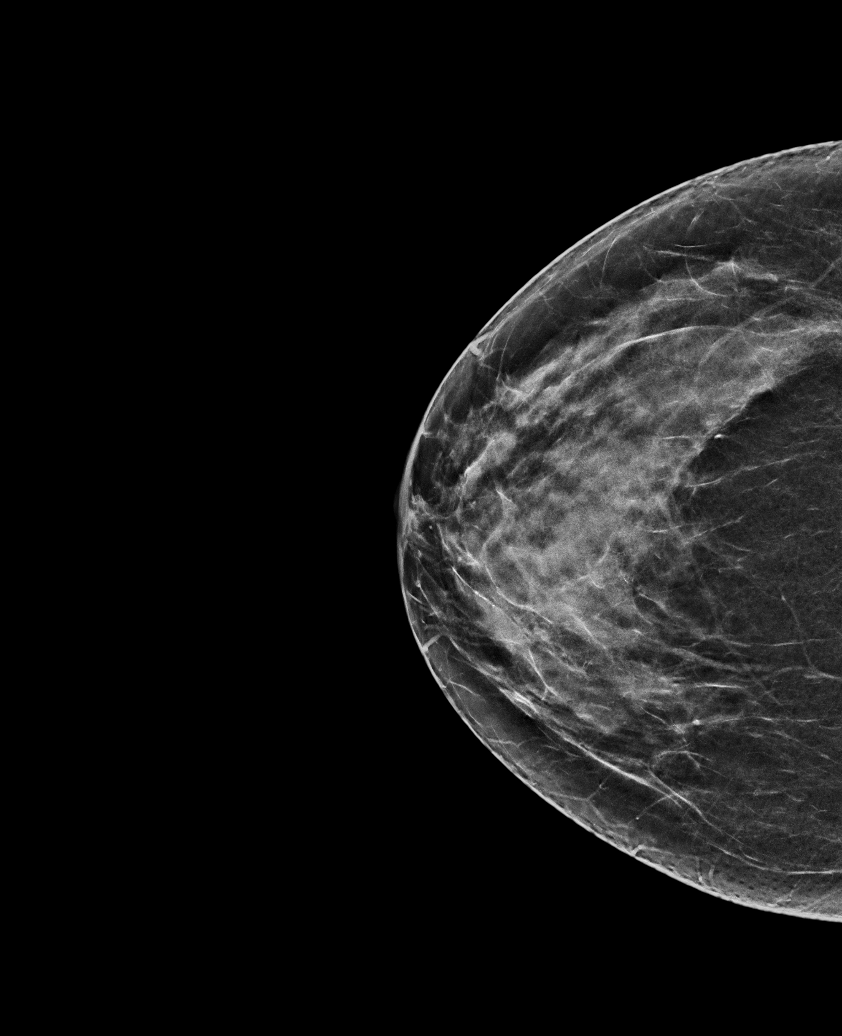

[L MLO synth-2D (1 of 2)]
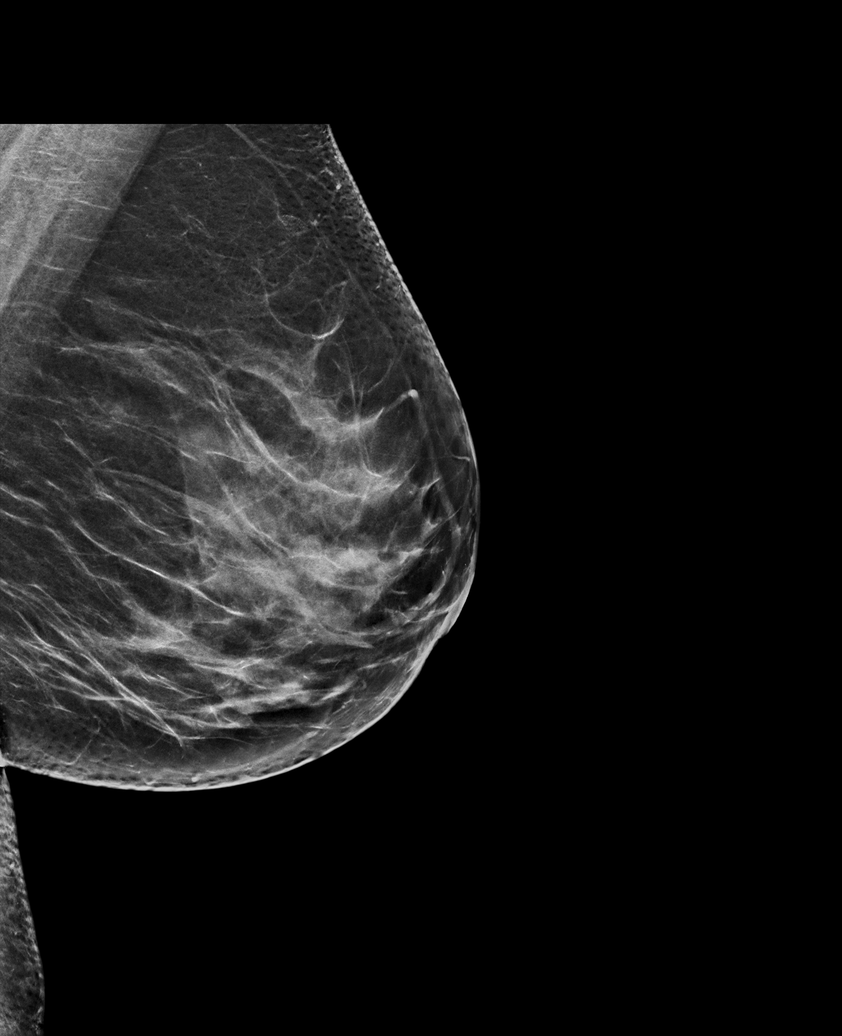

[L MLO synth-2D (2 of 2)]
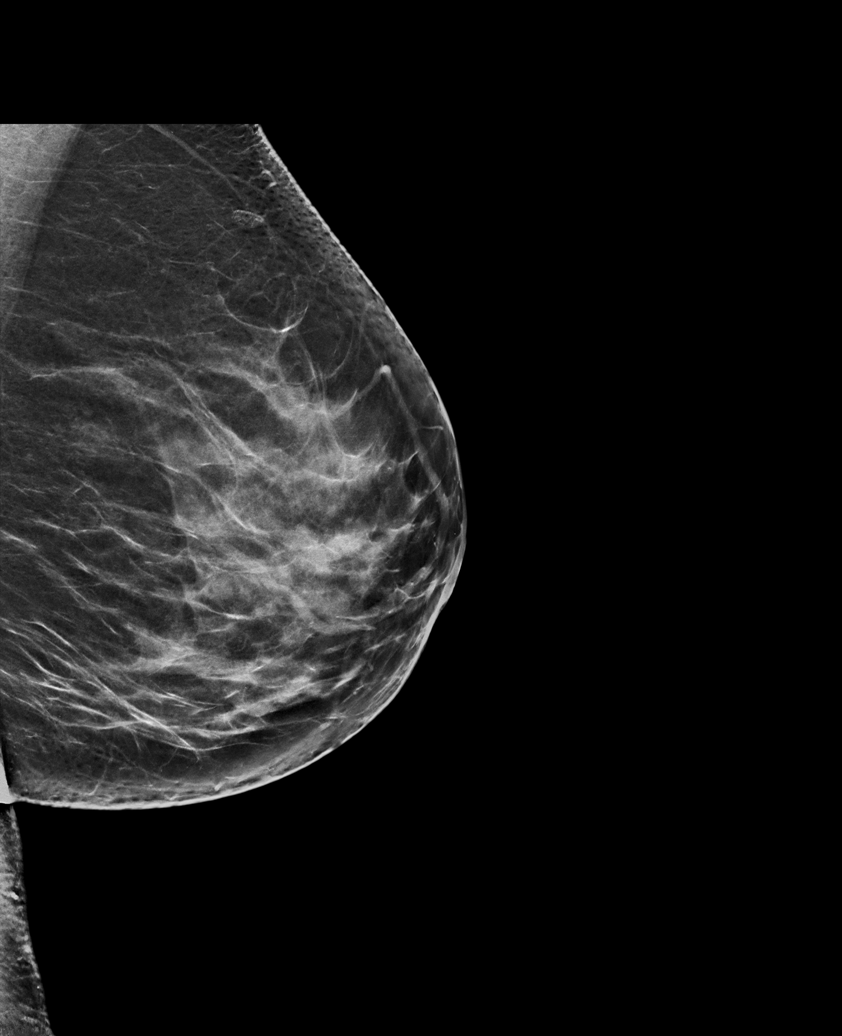

[L CC synth-2D]
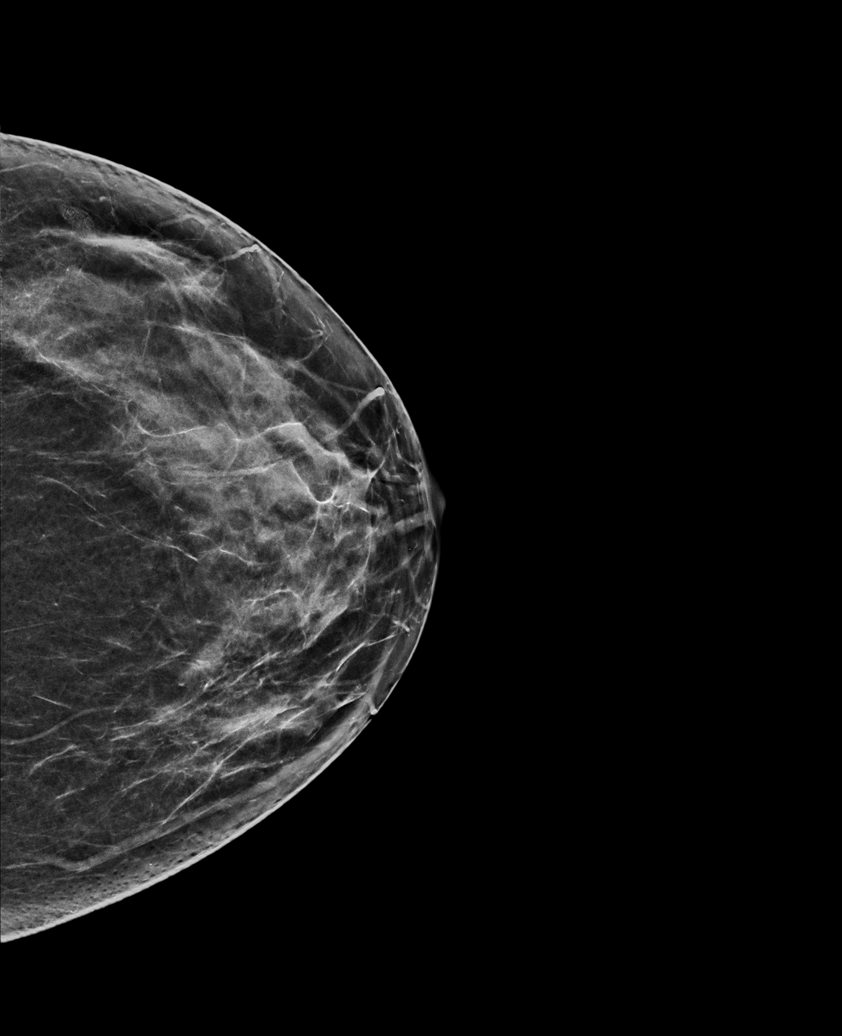

[R MLO synth-2D]
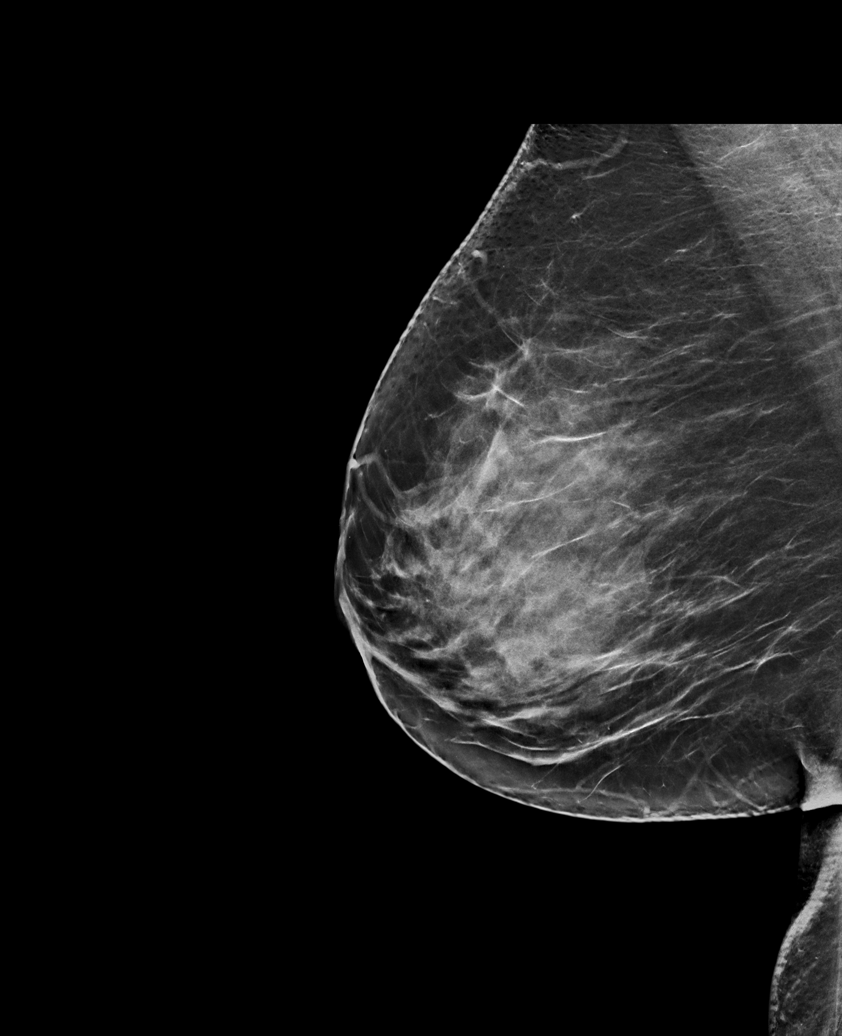

[R CC tomo · tomo slice 31/61.0]
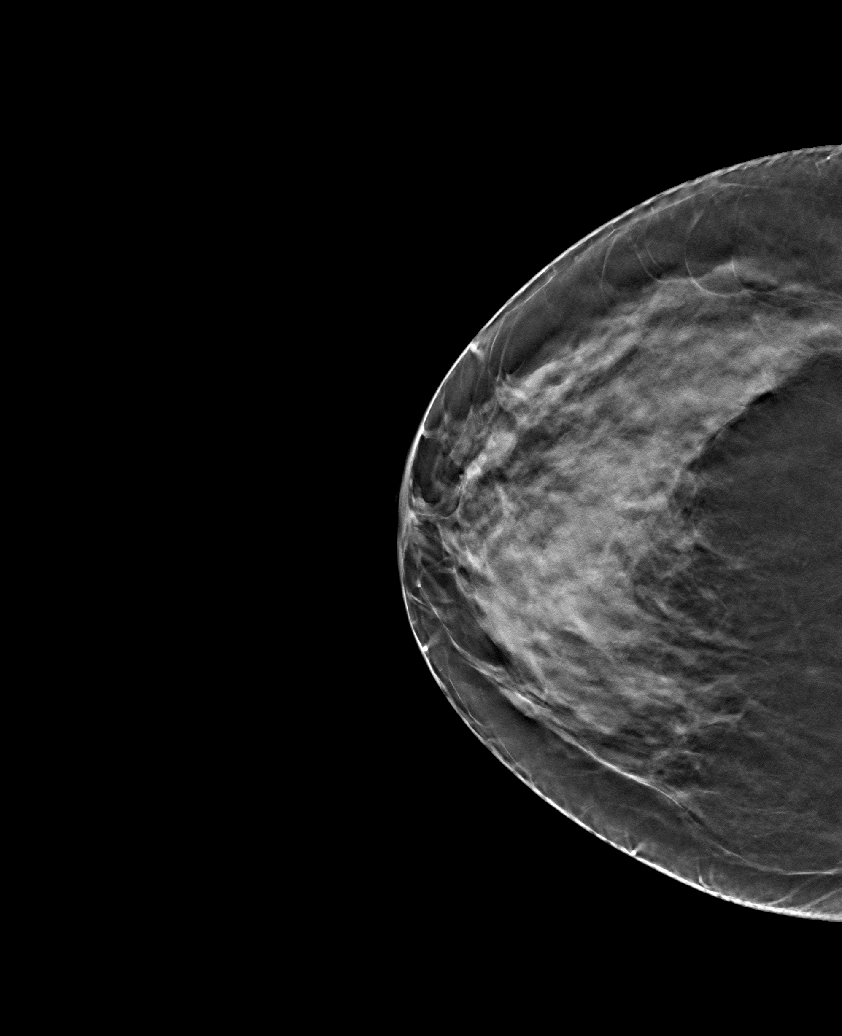

[6 of 30 positions shown; findings below may reference images not displayed]

ACR Breast Density Category c: The breast tissue is heterogeneously
dense, which may obscure small masses.
FINDINGS: There are no findings suspicious for malignancy. Images were
processed with CAD.
IMPRESSION: No mammographic evidence of malignancy. A result letter of this
screening mammogram will be mailed directly to the patient.

RECOMMENDATION:
Screening mammogram in one year. (Code:FT-U-LHB)

BI-RADS CATEGORY  1: Negative.

## 2022-01-24 ENCOUNTER — Other Ambulatory Visit (INDEPENDENT_AMBULATORY_CARE_PROVIDER_SITE_OTHER): Payer: Self-pay | Admitting: Family Medicine

## 2022-01-24 DIAGNOSIS — R632 Polyphagia: Secondary | ICD-10-CM

## 2022-01-31 ENCOUNTER — Encounter (INDEPENDENT_AMBULATORY_CARE_PROVIDER_SITE_OTHER): Payer: Self-pay

## 2022-01-31 ENCOUNTER — Telehealth (INDEPENDENT_AMBULATORY_CARE_PROVIDER_SITE_OTHER): Payer: Self-pay | Admitting: Family Medicine

## 2022-01-31 NOTE — Telephone Encounter (Signed)
Dr. Jearld Shines - Prior authorization denied for [facility]. Per insurance: You do not meet the requirements of your plan. Your plan covers this drug when you have not received 3 months of therapy with the requested drug within the past year. Patient sent denial message via mychart.

## 2022-02-07 ENCOUNTER — Encounter (INDEPENDENT_AMBULATORY_CARE_PROVIDER_SITE_OTHER): Payer: Self-pay | Admitting: Family Medicine

## 2022-02-07 LAB — BASIC METABOLIC PANEL
BUN: 17 (ref 4–21)
CO2: 25 — AB (ref 13–22)
Chloride: 105 (ref 99–108)
Creatinine: 0.9 (ref 0.5–1.1)
Glucose: 93
Potassium: 4.3 mEq/L (ref 3.5–5.1)
Sodium: 142 (ref 137–147)

## 2022-02-07 LAB — HEMOGLOBIN A1C: Hemoglobin A1C: 5.7

## 2022-02-07 LAB — COMPREHENSIVE METABOLIC PANEL
Albumin: 4.6 (ref 3.5–5.0)
Calcium: 9.6 (ref 8.7–10.7)
Globulin: 1.8
eGFR: 77

## 2022-02-07 LAB — CBC AND DIFFERENTIAL
HCT: 44 (ref 36–46)
Hemoglobin: 14.5 (ref 12.0–16.0)
Neutrophils Absolute: 3.4
Platelets: 277 10*3/uL (ref 150–400)
WBC: 5.3

## 2022-02-07 LAB — LIPID PANEL
Cholesterol: 153 (ref 0–200)
HDL: 56 (ref 35–70)
LDL Cholesterol: 75
Triglycerides: 122 (ref 40–160)

## 2022-02-07 LAB — HEPATIC FUNCTION PANEL
ALT: 25 U/L (ref 7–35)
AST: 18 (ref 13–35)
Alkaline Phosphatase: 99 (ref 25–125)
Bilirubin, Total: 0.2

## 2022-02-07 LAB — VITAMIN D 25 HYDROXY (VIT D DEFICIENCY, FRACTURES): Vit D, 25-Hydroxy: 46.8

## 2022-02-07 LAB — CBC: RBC: 5.05 (ref 3.87–5.11)

## 2022-02-07 LAB — TSH: TSH: 1.01 (ref 0.41–5.90)

## 2022-02-14 ENCOUNTER — Ambulatory Visit (INDEPENDENT_AMBULATORY_CARE_PROVIDER_SITE_OTHER): Payer: No Typology Code available for payment source | Admitting: Family Medicine

## 2022-02-14 ENCOUNTER — Encounter (INDEPENDENT_AMBULATORY_CARE_PROVIDER_SITE_OTHER): Payer: Self-pay | Admitting: Family Medicine

## 2022-02-14 VITALS — BP 130/82 | HR 101 | Temp 98.7°F | Ht 66.0 in | Wt 179.0 lb

## 2022-02-14 DIAGNOSIS — E559 Vitamin D deficiency, unspecified: Secondary | ICD-10-CM

## 2022-02-14 DIAGNOSIS — E669 Obesity, unspecified: Secondary | ICD-10-CM

## 2022-02-14 DIAGNOSIS — R7303 Prediabetes: Secondary | ICD-10-CM

## 2022-02-14 DIAGNOSIS — Z6829 Body mass index (BMI) 29.0-29.9, adult: Secondary | ICD-10-CM

## 2022-02-14 DIAGNOSIS — Z6834 Body mass index (BMI) 34.0-34.9, adult: Secondary | ICD-10-CM

## 2022-02-14 DIAGNOSIS — R632 Polyphagia: Secondary | ICD-10-CM | POA: Diagnosis not present

## 2022-02-14 MED ORDER — PHENTERMINE HCL 15 MG PO CAPS: 15.0000 mg | ORAL_CAPSULE | ORAL | 0 refills | Status: DC

## 2022-02-14 MED ORDER — TOPIRAMATE 100 MG PO TABS: 100.0000 mg | ORAL_TABLET | Freq: Every day | ORAL | 0 refills | Status: DC

## 2022-02-14 MED ORDER — VITAMIN D (ERGOCALCIFEROL) 1.25 MG (50000 UNIT) PO CAPS: 50000.0000 [IU] | ORAL_CAPSULE | ORAL | 0 refills | Status: DC

## 2022-02-15 NOTE — Progress Notes (Signed)
Chief Complaint:   OBESITY Heidi Payne is here to discuss her progress with her obesity treatment plan along with follow-up of her obesity related diagnoses. [name] is on the Category 3 Plan and states she is following her eating plan approximately 80% of the time. [name] states she is using treadmill 20 minutes 3 times per week.  Today's visit was #: 78 Starting weight: 215 lbs Starting date: 01/27/2021 Today's weight: 179 lbs Today's date: 02/14/2022 Total lbs lost to date: 36 lbs Total lbs lost since last in-office visit: 1  Interim History: Heidi Payne is really struggling with familiar stress/ (daughters not talking much to her and her father is mid dementia). She has reached out to counseling thru work ([employer]). Has not overate but likely not eating enough.   Subjective:   1. Prediabetes A1c at 5.7, insulin at 16.2 (previously 6.1 and 26.6). Previously on Mounjaro.  2. Vitamin D deficiency Heidi Payne is currently taking prescription Vit D 50,000 IU once a week. Last Vit D level 46.8.  3. Asencion Partridge on Phent/Topiramate, 1st treatment dose for 3 months---only lost 1 lb. She started treatment dose at 180 lbs mid July. PDMP (prescription drug monitoring program) checked--no concerns.  Assessment/Plan:   1. Prediabetes Follow up with labs in 3 months. Not at goal yet.  2. Vitamin D deficiency We will refill Vit D 50k IU once a week for 1 month with 0 refills.  -Refill Vitamin D, Ergocalciferol, (DRISDOL) 1.25 MG (50000 UNIT) CAPS capsule; Take 1 capsule (50,000 Units total) by mouth every 7 (seven) days.  Dispense: 4 capsule; Refill: 0  3. Polyphagia Increase/refill Phentermine 15 mg daily AND Topiramate 100 mg daily for 1 month with 0 refills.  -Increase/refill phentermine 15 MG capsule; Take 1 capsule (15 mg total) by mouth every morning.  Dispense: 30 capsule; Refill: 0  -Increase/refill topiramate (TOPAMAX) 100 MG tablet; Take 1 tablet (100 mg total) by mouth  daily.  Dispense: 30 tablet; Refill: 0  4. Obesity with current BMI of 29.0 Heidi Payne is currently in the action stage of change. As such, her goal is to continue with weight loss efforts. She has agreed to the Category 3 Plan.   Exercise goals: As is.  Behavioral modification strategies: increasing lean protein intake, meal planning and cooking strategies, keeping healthy foods in the home, and planning for success.  [name] has agreed to follow-up with our clinic in 4 weeks. She was informed of the importance of frequent follow-up visits to maximize her success with intensive lifestyle modifications for her multiple health conditions.   Objective:   Blood pressure 130/82, pulse (!) 101, temperature 98.7 F (37.1 C), height '5\' 6"'$  (1.676 m), weight 179 lb (81.2 kg), last menstrual period 12/12/2018, SpO2 99 %. Body mass index is 28.89 kg/m.  General: Cooperative, alert, well developed, in no acute distress. HEENT: Conjunctivae and lids unremarkable. Cardiovascular: Regular rhythm.  Lungs: Normal work of breathing. Neurologic: No focal deficits.   Lab Results  Component Value Date   CREATININE 0.76 01/27/2021   BUN 13 01/27/2021   NA 142 01/27/2021   K 4.0 01/27/2021   CL 103 01/27/2021   CO2 25 01/27/2021   Lab Results  Component Value Date   ALT 44 (H) 01/27/2021   AST 29 01/27/2021   ALKPHOS 130 (H) 01/27/2021   BILITOT 0.4 01/27/2021   Lab Results  Component Value Date   HGBA1C 6.1 (H) 01/27/2021   Lab Results  Component Value Date   INSULIN 26.6 (  H) 01/27/2021   Lab Results  Component Value Date   TSH 1.110 01/27/2021   Lab Results  Component Value Date   CHOL 183 01/27/2021   HDL 52 01/27/2021   LDLCALC 103 (H) 01/27/2021   TRIG 158 (H) 01/27/2021   CHOLHDL 5.6 12/30/2015   Lab Results  Component Value Date   VD25OH 40.5 09/05/2021   VD25OH 26.2 (L) 01/27/2021   Lab Results  Component Value Date   WBC 6.2 01/27/2021   HGB 14.2 01/27/2021   HCT  42.9 01/27/2021   MCV 84 01/27/2021   PLT 296 01/27/2021   No results found for: "IRON", "TIBC", "FERRITIN"  Attestation Statements:   Reviewed by clinician on day of visit: allergies, medications, problem list, medical history, surgical history, family history, social history, and previous encounter notes.  I, Elnora Morrison, RMA am acting as transcriptionist for Coralie Common, MD. I have reviewed the above documentation for accuracy and completeness, and I agree with the above. - Coralie Common, MD

## 2022-02-16 ENCOUNTER — Telehealth (INDEPENDENT_AMBULATORY_CARE_PROVIDER_SITE_OTHER): Payer: Self-pay | Admitting: Family Medicine

## 2022-02-16 ENCOUNTER — Encounter (INDEPENDENT_AMBULATORY_CARE_PROVIDER_SITE_OTHER): Payer: Self-pay

## 2022-02-16 NOTE — Telephone Encounter (Signed)
Dr. Jearld Shines - Prior authorization denied for Phentermine.  Per insurance: Your plan covers this drug when you have not received 3 months of therapy with the requested drug within the past year. Patient sent denial message via mychart.

## 2022-03-11 ENCOUNTER — Other Ambulatory Visit (INDEPENDENT_AMBULATORY_CARE_PROVIDER_SITE_OTHER): Payer: Self-pay | Admitting: Family Medicine

## 2022-03-11 DIAGNOSIS — R632 Polyphagia: Secondary | ICD-10-CM

## 2022-03-14 ENCOUNTER — Ambulatory Visit (INDEPENDENT_AMBULATORY_CARE_PROVIDER_SITE_OTHER): Payer: No Typology Code available for payment source | Admitting: Adult Health

## 2022-03-14 ENCOUNTER — Other Ambulatory Visit: Payer: Self-pay | Admitting: Adult Health

## 2022-03-14 ENCOUNTER — Other Ambulatory Visit (HOSPITAL_COMMUNITY)
Admission: RE | Admit: 2022-03-14 | Discharge: 2022-03-14 | Disposition: A | Discharge status: Home / Self Care | Payer: No Typology Code available for payment source | Source: Ambulatory Visit | Attending: Adult Health | Admitting: Adult Health

## 2022-03-14 ENCOUNTER — Encounter: Payer: Self-pay | Admitting: Adult Health

## 2022-03-14 VITALS — BP 127/88 | HR 97 | Ht 66.0 in | Wt 189.5 lb

## 2022-03-14 DIAGNOSIS — K649 Unspecified hemorrhoids: Secondary | ICD-10-CM

## 2022-03-14 DIAGNOSIS — Z01419 Encounter for gynecological examination (general) (routine) without abnormal findings: Secondary | ICD-10-CM | POA: Diagnosis present

## 2022-03-14 DIAGNOSIS — L80 Vitiligo: Secondary | ICD-10-CM | POA: Diagnosis not present

## 2022-03-14 DIAGNOSIS — Z1231 Encounter for screening mammogram for malignant neoplasm of breast: Secondary | ICD-10-CM

## 2022-03-14 DIAGNOSIS — L900 Lichen sclerosus et atrophicus: Secondary | ICD-10-CM | POA: Diagnosis not present

## 2022-03-14 DIAGNOSIS — Z1211 Encounter for screening for malignant neoplasm of colon: Secondary | ICD-10-CM | POA: Diagnosis not present

## 2022-03-14 DIAGNOSIS — N941 Unspecified dyspareunia: Secondary | ICD-10-CM

## 2022-03-14 LAB — HEMOCCULT GUIAC POC 1CARD (OFFICE): Fecal Occult Blood, POC: NEGATIVE

## 2022-03-14 MED ORDER — ESTRADIOL 0.1 MG/GM VA CREA: TOPICAL_CREAM | VAGINAL | 3 refills | Status: DC

## 2022-03-14 MED ORDER — PREMARIN 0.625 MG/GM VA CREA: TOPICAL_CREAM | VAGINAL | 3 refills | Status: DC

## 2022-03-14 MED ORDER — HYDROCORTISONE ACE-PRAMOXINE 1-1 % EX CREA: 1.0000 | TOPICAL_CREAM | Freq: Two times a day (BID) | CUTANEOUS | 0 refills | Status: DC

## 2022-03-14 MED ORDER — CLOBETASOL PROPIONATE 0.05 % EX CREA: 1.0000 | TOPICAL_CREAM | Freq: Two times a day (BID) | CUTANEOUS | 3 refills | Status: DC

## 2022-03-14 NOTE — Progress Notes (Signed)
Will rx estrace vaginal cream, PVC not covered

## 2022-03-14 NOTE — Progress Notes (Signed)
Patient ID: Rovena Hearld, female   DOB: Oct 22, 1965, 56 y.o.   MRN: [id number] History of Present Illness: [name] is a 56 year old white female, married, PM in for a well woman gyn exam and pap. She is having some pain with intercourse. And has hemorrhoid. She has lost weight, going to [facility]. Reviewed labs from September all normal except A1c 5.7  PCP is Dr Quillian Quince.   Current Medications, Allergies, Past Medical History, Past Surgical History, Family History and Social History were reviewed in [employer] record.     Review of Systems: Patient denies any headaches, hearing loss, fatigue, blurred vision, shortness of breath, chest pain, abdominal pain, problems with bowel movements, urination.  No joint pain or mood swings.  See HPI for positives.    Physical Exam:BP 127/88 (BP Location: Left Arm, Patient Position: Sitting, Cuff Size: Normal)   Pulse 97   Ht '5\' 6"'$  (1.676 m)   Wt 189 lb 8 oz (86 kg)   LMP 12/12/2018   BMI 30.59 kg/m   General:  Well developed, well nourished, no acute distress Skin:  Warm and dry,has vitiligo  Neck:  Midline trachea, normal thyroid, good ROM, no lymphadenopathy Lungs; Clear to auscultation bilaterally Breast:  No dominant palpable mass, retraction, or nipple discharge, has AK left breast  Cardiovascular: Regular rate and rhythm Abdomen:  Soft, non tender, no hepatosplenomegaly Pelvic:  External genitalia is normal in appearance, no lesions. Has stable LS. The vagina is pale and dry, with loss of rugae.Marland Kitchen Urethra has no lesions or masses. The cervix is smooth, pap with HR HPV genotyping performed.  Uterus is felt to be normal size, shape, and contour.  No adnexal masses or tenderness noted.Bladder is non tender, no masses felt. Rectal: Good sphincter tone, no polyps, + hemorrhoids felt.  Hemoccult negative. Extremities/musculoskeletal:  No swelling or varicosities noted, no clubbing or cyanosis Psych:   No mood changes, alert and cooperative,seems happy AA is 1 Fall risk is low    03/14/2022    9:58 AM 01/27/2021    9:05 AM 09/17/2017    8:59 AM  Depression screen PHQ 2/9  Decreased Interest 0 1 0  Down, Depressed, Hopeless 0 1 0  PHQ - 2 Score 0 2 0  Altered sleeping 0 0   Tired, decreased energy 0 2   Change in appetite 0 3   Feeling bad or failure about yourself  0 1   Trouble concentrating 0 1   Moving slowly or fidgety/restless 0 0   Suicidal thoughts 0 0   PHQ-9 Score 0 9   Difficult doing work/chores  Not difficult at all    She is on meds and sees therapist     03/14/2022    9:58 AM  GAD 7 : Generalized Anxiety Score  Nervous, Anxious, on Edge 0  Control/stop worrying 0  Worry too much - different things 0  Trouble relaxing 0  Restless 0  Easily annoyed or irritable 0  Afraid - awful might happen 0  Total GAD 7 Score 0      Upstream - 03/14/22 0957       Pregnancy Intention Screening   Does the patient want to become pregnant in the next year? No    Does the patient's partner want to become pregnant in the next year? No    Would the patient like to discuss contraceptive options today? No      Contraception Wrap Up  Current Method Female Sterilization    End Method Female Sterilization    Contraception Counseling Provided No            Examination chaperoned by Levy Pupa LPN    Impression and Plan: 1. Encounter for gynecological examination with Papanicolaou smear of cervix Pap sent Pap in 3 years if normal Physical in 1 year Labs with PCP Colonoscopy per GI Mammogram 01/15/20 and negative  She will schedule mammogram,order placed   2. Encounter for screening fecal occult blood testing Hemoccult was negative   3. Lichen sclerosus Continue temovate, refilled   4. Vitiligo  5. Hemorrhoids, unspecified hemorrhoid type Will rx anal pram HC  6. Dyspareunia, female Has dry vaginal  Will try premarin vaginal cream 4 sample tubes  given Use good lubricate with sex. Let me know how this is working in few months   Meds ordered this encounter  Medications   conjugated estrogens (PREMARIN) vaginal cream    Sig: Use 0.5 gm in vagina daily for 2 weeks then 2 x weekly    Dispense:  42.5 g    Refill:  3    Order Specific Question:   Supervising Provider    Answer:   Tania Ade H [[id number]]   clobetasol cream (TEMOVATE) 0.05 %    Sig: Apply 1 Application topically 2 (two) times daily.    Dispense:  30 g    Refill:  3    Order Specific Question:   Supervising Provider    Answer:   Tania Ade H [[id number]]   pramoxine-hydrocortisone (PROCTOCREAM-HC) 1-1 % rectal cream    Sig: Place 1 Application rectally 2 (two) times daily.    Dispense:  30 g    Refill:  0    Order Specific Question:   Supervising Provider    Answer:   Tania Ade H [[id number]]

## 2022-03-15 ENCOUNTER — Other Ambulatory Visit (INDEPENDENT_AMBULATORY_CARE_PROVIDER_SITE_OTHER): Payer: Self-pay | Admitting: Family Medicine

## 2022-03-15 DIAGNOSIS — R632 Polyphagia: Secondary | ICD-10-CM

## 2022-03-17 LAB — CYTOLOGY - PAP
Comment: NEGATIVE
Comment: NEGATIVE
Comment: NEGATIVE
HPV 16: NEGATIVE
HPV 18 / 45: NEGATIVE
High risk HPV: POSITIVE — AB

## 2022-03-20 ENCOUNTER — Encounter: Payer: Self-pay | Admitting: Adult Health

## 2022-03-20 ENCOUNTER — Telehealth: Payer: Self-pay | Admitting: Adult Health

## 2022-03-20 ENCOUNTER — Ambulatory Visit (INDEPENDENT_AMBULATORY_CARE_PROVIDER_SITE_OTHER): Payer: No Typology Code available for payment source | Admitting: Family Medicine

## 2022-03-20 VITALS — BP 148/90 | HR 87 | Temp 98.8°F | Ht 66.0 in | Wt 185.0 lb

## 2022-03-20 DIAGNOSIS — R632 Polyphagia: Secondary | ICD-10-CM

## 2022-03-20 DIAGNOSIS — E669 Obesity, unspecified: Secondary | ICD-10-CM | POA: Diagnosis not present

## 2022-03-20 DIAGNOSIS — Z6830 Body mass index (BMI) 30.0-30.9, adult: Secondary | ICD-10-CM

## 2022-03-20 DIAGNOSIS — E559 Vitamin D deficiency, unspecified: Secondary | ICD-10-CM | POA: Diagnosis not present

## 2022-03-20 DIAGNOSIS — R87612 Low grade squamous intraepithelial lesion on cytologic smear of cervix (LGSIL): Secondary | ICD-10-CM | POA: Insufficient documentation

## 2022-03-20 HISTORY — DX: Low grade squamous intraepithelial lesion on cytologic smear of cervix (LGSIL): R87.612

## 2022-03-20 MED ORDER — VITAMIN D (ERGOCALCIFEROL) 1.25 MG (50000 UNIT) PO CAPS: 50000.0000 [IU] | ORAL_CAPSULE | ORAL | 0 refills | Status: DC

## 2022-03-20 MED ORDER — PHENTERMINE HCL 15 MG PO CAPS: 15.0000 mg | ORAL_CAPSULE | ORAL | 0 refills | Status: DC

## 2022-03-20 MED ORDER — TOPIRAMATE 100 MG PO TABS: 100.0000 mg | ORAL_TABLET | Freq: Every day | ORAL | 0 refills | Status: DC

## 2022-03-20 NOTE — Telephone Encounter (Signed)
Pt aware of pap, will get colpo scheduled.

## 2022-03-22 ENCOUNTER — Ambulatory Visit (INDEPENDENT_AMBULATORY_CARE_PROVIDER_SITE_OTHER): Payer: No Typology Code available for payment source | Admitting: Obstetrics & Gynecology

## 2022-03-22 ENCOUNTER — Other Ambulatory Visit (HOSPITAL_COMMUNITY)
Admission: RE | Admit: 2022-03-22 | Discharge: 2022-03-22 | Disposition: A | Discharge status: Home / Self Care | Payer: No Typology Code available for payment source | Source: Ambulatory Visit | Attending: Obstetrics & Gynecology | Admitting: Obstetrics & Gynecology

## 2022-03-22 ENCOUNTER — Encounter: Payer: Self-pay | Admitting: Obstetrics & Gynecology

## 2022-03-22 VITALS — BP 146/93 | HR 84 | Ht 66.0 in | Wt 189.0 lb

## 2022-03-22 DIAGNOSIS — N870 Mild cervical dysplasia: Secondary | ICD-10-CM

## 2022-03-22 DIAGNOSIS — R87612 Low grade squamous intraepithelial lesion on cytologic smear of cervix (LGSIL): Secondary | ICD-10-CM | POA: Insufficient documentation

## 2022-03-22 DIAGNOSIS — R87618 Other abnormal cytological findings on specimens from cervix uteri: Secondary | ICD-10-CM

## 2022-03-22 NOTE — Progress Notes (Signed)
    Patient name: Heidi Payne MRN [id number]  Date of birth: October 31, 1965 Chief Complaint:   Colposcopy  History of Present Illness:   Heidi Payne is a 56 y.o. [id number] PM female being seen today for cervical dysplasia management.  Recent pap- 03/14/2022: LSIL, HPV+, 16/18 neg Notes h/o abnormal pap and colposcopy several few yrs ago  Smoker:  No. New sexual partner:  No.  Of note, at annual noted dyspareunia/vaginal dryness- prescription of premarin sent in, which she has not yet started.  History of abnormal Pap: yes  No acute complaints today  Patient's last menstrual period was 12/12/2018.     03/14/2022    9:58 AM 01/27/2021    9:05 AM 09/17/2017    8:59 AM 09/14/2016   10:29 AM  Depression screen PHQ 2/9  Decreased Interest 0 1 0 0  Down, Depressed, Hopeless 0 1 0 0  PHQ - 2 Score 0 2 0 0  Altered sleeping 0 0    Tired, decreased energy 0 2    Change in appetite 0 3    Feeling bad or failure about yourself  0 1    Trouble concentrating 0 1    Moving slowly or fidgety/restless 0 0    Suicidal thoughts 0 0    PHQ-9 Score 0 9    Difficult doing work/chores  Not difficult at all       Review of Systems:   Pertinent items are noted in HPI Denies fever/chills, dizziness, headaches, visual disturbances, fatigue, shortness of breath, chest pain, abdominal pain, vomiting. Pertinent History Reviewed:  Reviewed past medical,surgical, social, obstetrical and family history.  Reviewed problem list, medications and allergies. Physical Assessment:   Vitals:   03/22/22 1113 03/22/22 1116  BP: (!) 138/91 (!) 146/93  Pulse: 84 84  Weight: 189 lb (85.7 kg)   Height: '5\' 6"'$  (1.676 m)   Body mass index is 30.51 kg/m.       Physical Examination:   General appearance: alert, well appearing, and in no distress  Psych: mood appropriate, normal affect  Skin: warm & dry   Cardiovascular: normal heart rate noted  Respiratory: normal respiratory effort, no  distress  Abdomen: soft, non-tender   Pelvic: VULVA: normal appearing vulva with no masses, tenderness or lesions, VAGINA: normal appearing vagina with normal color and discharge, no lesions, CERVIX: see colposcopy section  Extremities: no edema   Chaperone:  Andrez Grime      Colposcopy Procedure Note  Indications: LSIL, HPV+   Procedure Details  The risks and benefits of the procedure and Written informed consent obtained.  Speculum placed in vagina and excellent visualization of cervix achieved, cervix swabbed x 3 with acetic acid solution.  Findings: Adequate colposcopy is noted today.  TMZ zone visualized  Cervix: Acteowhite changes noted, mosaicism noted   Monsel's applied.  Adequate hemostasis noted  Specimens: ECC and cervical biopsies obtained  Complications: none.  Colposcopic Impression: CIN 1   Plan(Based on 2019 ASCCP recommendations)  -Discussed HPV- reviewed incidence and its potential to cause condylomas to dysplasia to cervical cancer -Reviewed degree of abnormal pap smears  -Discussed ASCCP guidelines and current recommendations for colposcopy -As above, inform consent obtained and procedure completed -biopsies obtained, further management pending results -Questions and concerns were addressed  Janyth Pupa, DO Attending [place], [place] [facility] [place] [place]

## 2022-03-23 LAB — SURGICAL PATHOLOGY

## 2022-03-27 NOTE — Progress Notes (Signed)
Chief Complaint:   OBESITY Heidi Payne is here to discuss her progress with her obesity treatment plan along with follow-up of her obesity related diagnoses. Heidi Payne is on the Category 3 Plan and states she is following her eating plan approximately 60% of the time. Heidi Payne states she is using kettle ball/treadmill  10-15 minutes 4 times per week.  Today's visit was #: 17 Starting weight: 215 lbs Starting date: 01/27/2021 Today's weight: 185 lbs Today's date: 03/20/2022 Total lbs lost to date: 30 lbs Total lbs lost since last in-office visit: 6  Interim History: Heidi Payne has a wedding celebration over the last few weeks and voices she did over indulge with all extra sweets. All sweets out of the house now. Plans to get back to Cat 3 meal plan now. Not sure of plans for Thanksgiving yet.  Subjective:   1. Polyphagia Heidi Payne is on combo Phent/Topiramate. She started max dose 02/2022 at 179 lbs--needs loss to 181 lbs.  2. Vitamin D deficiency Heidi Payne is currently taking prescription Vit D 50,000 IU once a week. Denies any nausea, vomiting or muscle weakness. She notes fatigue.  Assessment/Plan:   1. Polyphagia We will refill Phentermine 15 mg by mouth daily AND refill Topiramate 100 mg by mouth daily for 1 month with 0 refills.  -Refill phentermine 15 MG capsule; Take 1 capsule (15 mg total) by mouth every morning.  Dispense: 30 capsule; Refill: 0  -Refill topiramate (TOPAMAX) 100 MG tablet; Take 1 tablet (100 mg total) by mouth daily.  Dispense: 30 tablet; Refill: 0  2. Vitamin D deficiency We will refill Vit D 50k IU once a week for 1 month with 0 refills.  -Refill Vitamin D, Ergocalciferol, (DRISDOL) 1.25 MG (50000 UNIT) CAPS capsule; Take 1 capsule (50,000 Units total) by mouth every 7 (seven) days.  Dispense: 4 capsule; Refill: 0  3. Obesity with current BMI of 30.0 Heidi Payne is currently in the action stage of change. As such, her goal is to continue with weight loss  efforts. She has agreed to the Category 3 Plan.   Exercise goals: As is.  Behavioral modification strategies: increasing lean protein intake, meal planning and cooking strategies, keeping healthy foods in the home, and planning for success.  Heidi Payne has agreed to follow-up with our clinic in 4 weeks. She was informed of the importance of frequent follow-up visits to maximize her success with intensive lifestyle modifications for her multiple health conditions.   Objective:   Blood pressure (!) 148/90, pulse 87, temperature 98.8 F (37.1 C), height '5\' 6"'$  (1.676 m), weight 185 lb (83.9 kg), last menstrual period 12/12/2018, SpO2 99 %. Body mass index is 29.86 kg/m.  General: Cooperative, alert, well developed, in no acute distress. HEENT: Conjunctivae and lids unremarkable. Cardiovascular: Regular rhythm.  Lungs: Normal work of breathing. Neurologic: No focal deficits.   Lab Results  Component Value Date   CREATININE 0.9 02/07/2022   BUN 17 02/07/2022   NA 142 02/07/2022   K 4.3 02/07/2022   CL 105 02/07/2022   CO2 25 (A) 02/07/2022   Lab Results  Component Value Date   ALT 25 02/07/2022   AST 18 02/07/2022   ALKPHOS 99 02/07/2022   BILITOT 0.4 01/27/2021   Lab Results  Component Value Date   HGBA1C 5.7 02/07/2022   HGBA1C 6.1 (H) 01/27/2021   Lab Results  Component Value Date   INSULIN 26.6 (H) 01/27/2021   Lab Results  Component Value Date   TSH 1.01 02/07/2022  Lab Results  Component Value Date   CHOL 153 02/07/2022   HDL 56 02/07/2022   LDLCALC 75 02/07/2022   TRIG 122 02/07/2022   CHOLHDL 5.6 12/30/2015   Lab Results  Component Value Date   VD25OH 46.8 02/07/2022   VD25OH 40.5 09/05/2021   VD25OH 26.2 (L) 01/27/2021   Lab Results  Component Value Date   WBC 5.3 02/07/2022   HGB 14.5 02/07/2022   HCT 44 02/07/2022   MCV 84 01/27/2021   PLT 277 02/07/2022   No results found for: "IRON", "TIBC", "FERRITIN"  Attestation Statements:    Reviewed by clinician on day of visit: allergies, medications, problem list, medical history, surgical history, family history, social history, and previous encounter notes.  I, Elnora Morrison, RMA am acting as transcriptionist for Coralie Common, MD.  I have reviewed the above documentation for accuracy and completeness, and I agree with the above. - Coralie Common, MD

## 2022-03-29 ENCOUNTER — Emergency Department (HOSPITAL_COMMUNITY): Payer: No Typology Code available for payment source

## 2022-03-29 ENCOUNTER — Encounter (HOSPITAL_COMMUNITY): Payer: Self-pay

## 2022-03-29 ENCOUNTER — Other Ambulatory Visit: Payer: Self-pay

## 2022-03-29 ENCOUNTER — Emergency Department (HOSPITAL_COMMUNITY)
Admission: EM | Admit: 2022-03-29 | Discharge: 2022-03-29 | Disposition: A | Discharge status: Home / Self Care | Payer: No Typology Code available for payment source | Attending: Emergency Medicine | Admitting: Emergency Medicine

## 2022-03-29 DIAGNOSIS — N200 Calculus of kidney: Secondary | ICD-10-CM | POA: Diagnosis not present

## 2022-03-29 DIAGNOSIS — R1032 Left lower quadrant pain: Secondary | ICD-10-CM | POA: Diagnosis present

## 2022-03-29 DIAGNOSIS — Z79899 Other long term (current) drug therapy: Secondary | ICD-10-CM | POA: Diagnosis not present

## 2022-03-29 LAB — URINALYSIS, ROUTINE W REFLEX MICROSCOPIC
Bacteria, UA: NONE SEEN
Bilirubin Urine: NEGATIVE
Glucose, UA: NEGATIVE mg/dL
Hgb urine dipstick: NEGATIVE
Ketones, ur: NEGATIVE mg/dL
Nitrite: NEGATIVE
Protein, ur: 30 mg/dL — AB
Specific Gravity, Urine: 1.023 (ref 1.005–1.030)
WBC, UA: >50 WBC/hpf — ABNORMAL HIGH (ref 0–5)
pH: 6 (ref 5.0–8.0)

## 2022-03-29 LAB — COMPREHENSIVE METABOLIC PANEL
ALT: 28 U/L (ref 0–44)
AST: 24 U/L (ref 15–41)
Albumin: 3.9 g/dL (ref 3.5–5.0)
Alkaline Phosphatase: 93 U/L (ref 38–126)
Anion gap: 6 (ref 5–15)
BUN: 21 mg/dL — ABNORMAL HIGH (ref 6–20)
CO2: 23 mmol/L (ref 22–32)
Calcium: 8.4 mg/dL — ABNORMAL LOW (ref 8.9–10.3)
Chloride: 108 mmol/L (ref 98–111)
Creatinine, Ser: 1.27 mg/dL — ABNORMAL HIGH (ref 0.44–1.00)
GFR, Estimated: 50 mL/min — ABNORMAL LOW (ref >60)
Glucose, Bld: 86 mg/dL (ref 70–99)
Potassium: 3.4 mmol/L — ABNORMAL LOW (ref 3.5–5.1)
Sodium: 137 mmol/L (ref 135–145)
Total Bilirubin: 0.7 mg/dL (ref 0.3–1.2)
Total Protein: 6.7 g/dL (ref 6.5–8.1)

## 2022-03-29 LAB — CBC
HCT: 38.7 % (ref 36.0–46.0)
Hemoglobin: 13.2 g/dL (ref 12.0–15.0)
MCH: 29.4 pg (ref 26.0–34.0)
MCHC: 34.1 g/dL (ref 30.0–36.0)
MCV: 86.2 fL (ref 80.0–100.0)
Platelets: 243 10*3/uL (ref 150–400)
RBC: 4.49 MIL/uL (ref 3.87–5.11)
RDW: 12.5 % (ref 11.5–15.5)
WBC: 9.6 10*3/uL (ref 4.0–10.5)
nRBC: 0 % (ref 0.0–0.2)

## 2022-03-29 LAB — LIPASE, BLOOD: Lipase: 29 U/L (ref 11–51)

## 2022-03-29 MED ORDER — TAMSULOSIN HCL 0.4 MG PO CAPS
0.4000 mg | ORAL_CAPSULE | Freq: Once | ORAL | Status: AC
  Administered 2022-03-29: 0.4 mg via ORAL
  Filled 2022-03-29: qty 1

## 2022-03-29 MED ORDER — SODIUM CHLORIDE 0.9 % IV BOLUS
1000.0000 mL | Freq: Once | INTRAVENOUS | Status: AC
  Administered 2022-03-29: 1000 mL via INTRAVENOUS

## 2022-03-29 MED ORDER — TAMSULOSIN HCL 0.4 MG PO CAPS: 0.4000 mg | ORAL_CAPSULE | Freq: Every day | ORAL | 0 refills | Status: AC

## 2022-03-29 MED ORDER — KETOROLAC TROMETHAMINE 30 MG/ML IJ SOLN
15.0000 mg | Freq: Once | INTRAMUSCULAR | Status: AC
  Administered 2022-03-29: 15 mg via INTRAVENOUS
  Filled 2022-03-29: qty 1

## 2022-03-29 MED ORDER — ONDANSETRON HCL 4 MG/2ML IJ SOLN
4.0000 mg | Freq: Once | INTRAMUSCULAR | Status: AC
  Administered 2022-03-29: 4 mg via INTRAVENOUS
  Filled 2022-03-29: qty 2

## 2022-03-29 MED ORDER — OXYCODONE-ACETAMINOPHEN 5-325 MG PO TABS: 1.0000 | ORAL_TABLET | ORAL | 0 refills | Status: AC | PRN

## 2022-03-29 MED ORDER — ONDANSETRON HCL 4 MG PO TABS: 4.0000 mg | ORAL_TABLET | Freq: Four times a day (QID) | ORAL | 0 refills | Status: AC

## 2022-03-29 MED ORDER — HYDROMORPHONE HCL 1 MG/ML IJ SOLN
1.0000 mg | Freq: Once | INTRAMUSCULAR | Status: AC
  Administered 2022-03-29: 1 mg via INTRAVENOUS
  Filled 2022-03-29: qty 1

## 2022-03-29 NOTE — ED Triage Notes (Signed)
Pt reports left lower abdomen and back pain since Sunday. Hx of kidney stones. Pt reports not being able to rest night. Now patient has burning on urination.

## 2022-03-29 NOTE — ED Provider Notes (Signed)
[facility] EMERGENCY DEPARTMENT Provider Note   CSN: [id number] Arrival date & time: 03/29/22  1456     History  Chief Complaint  Patient presents with   Abdominal Pain   Back Pain    Heidi Payne is a 56 y.o. female.  The history is provided by the patient.  Abdominal Pain Pain location:  L flank Pain quality: sharp and stabbing   Pain radiates to:  LLQ Pain severity:  Moderate Onset quality:  Sudden Duration:  3 days Timing:  Intermittent Progression:  Worsening Chronicity:  Recurrent Context comment:  History of kidney stones, most recently 2-3 years ago. Relieved by:  Nothing Worsened by:  Nothing Ineffective treatments:  NSAIDs Associated symptoms: dysuria, nausea and vomiting   Associated symptoms: no chills, no fever and no shortness of breath        Home Medications Prior to Admission medications   Medication Sig Start Date End Date Taking? Authorizing Provider  acetaminophen (TYLENOL) 500 MG tablet Take 500 mg by mouth every 6 (six) hours as needed.   Yes [provider]  albuterol (PROVENTIL HFA;VENTOLIN HFA) 108 (90 Base) MCG/ACT inhaler Inhale 2 puffs into the lungs every 6 (six) hours as needed for wheezing or shortness of breath.   Yes [provider]  amLODipine (NORVASC) 2.5 MG tablet Take 2.5 mg by mouth daily. 02/19/20  Yes [provider]  atorvastatin (LIPITOR) 40 MG tablet Take 40 mg by mouth at bedtime.   Yes [provider]  buPROPion (WELLBUTRIN XL) 150 MG 24 hr tablet Take 150 mg by mouth daily.   Yes [provider]  clobetasol cream (TEMOVATE) 8.67 % Apply 1 Application topically 2 (two) times daily. 03/14/22  Yes Derrek Monaco A, NP  DULoxetine (CYMBALTA) 60 MG capsule Take 60 mg by mouth 2 (two) times daily.    Yes [provider]  estradiol (ESTRACE VAGINAL) 0.1 MG/GM vaginal cream Use 0.5 gm 2 x weekly in vagina 03/14/22  Yes Derrek Monaco A, NP  ibuprofen (ADVIL)  200 MG tablet Take 200 mg by mouth every 6 (six) hours as needed.   Yes [provider]  loratadine (CLARITIN) 10 MG tablet Take 10 mg by mouth daily.   Yes [provider]  losartan (COZAAR) 25 MG tablet Take 25 mg by mouth daily. 02/19/20  Yes [provider]  montelukast (SINGULAIR) 10 MG tablet Take 10 mg by mouth at bedtime. 07/10/20  Yes [provider]  Multiple Vitamins-Minerals (MULTIVITAMIN WITH MINERALS) tablet Take 2 tablets by mouth daily.   Yes [provider]  omeprazole (PRILOSEC) 40 MG capsule Take 40 mg by mouth daily.   Yes [provider]  ondansetron (ZOFRAN) 4 MG tablet Take 1 tablet (4 mg total) by mouth every 6 (six) hours. 03/29/22  Yes Zaryiah Barz, Almyra Free, PA-C  oxyCODONE-acetaminophen (PERCOCET/ROXICET) 5-325 MG tablet Take 1 tablet by mouth every 4 (four) hours as needed. 03/29/22  Yes Aneka Fagerstrom, Almyra Free, PA-C  phentermine 15 MG capsule Take 1 capsule (15 mg total) by mouth every morning. 03/20/22  Yes Laqueta Linden, MD  tamsulosin (FLOMAX) 0.4 MG CAPS capsule Take 1 capsule (0.4 mg total) by mouth daily after supper. 03/29/22  Yes Nehemie Casserly, Almyra Free, PA-C  topiramate (TOPAMAX) 100 MG tablet Take 1 tablet (100 mg total) by mouth daily. 03/20/22  Yes Laqueta Linden, MD  Vitamin D, Ergocalciferol, (DRISDOL) 1.25 MG (50000 UNIT) CAPS capsule Take 1 capsule (50,000 Units total) by mouth every 7 (seven) days. 03/20/22  Yes Laqueta Linden, MD  Fluticasone Propionate, Inhal, 55 MCG/ACT AEPB Inhale into the lungs. Patient not taking: Reported on 03/29/2022    [provider]  pramoxine-hydrocortisone (PROCTOCREAM-HC) 1-1 % rectal cream Place 1 Application rectally 2 (two) times daily. 03/14/22   Estill Dooms, NP      Allergies    Patient has no known allergies.    Review of Systems   Review of Systems  Constitutional:  Negative for chills and fever.  Respiratory:  Negative for shortness of breath.    Gastrointestinal:  Positive for abdominal pain, nausea and vomiting.  Genitourinary:  Positive for dysuria and flank pain.    Physical Exam Updated Vital Signs BP 124/77   Pulse 88   Temp 98.5 F (36.9 C) (Oral)   Resp 17   Ht '5\' 6"'$  (1.676 m)   Wt 87.2 kg   LMP 12/12/2018   SpO2 95%   BMI 31.02 kg/m  Physical Exam Vitals and nursing note reviewed.  Constitutional:      Appearance: She is well-developed.  HENT:     Head: Normocephalic and atraumatic.  Eyes:     Conjunctiva/sclera: Conjunctivae normal.  Cardiovascular:     Rate and Rhythm: Normal rate and regular rhythm.     Heart sounds: Normal heart sounds.  Pulmonary:     Effort: Pulmonary effort is normal.     Breath sounds: Normal breath sounds. No wheezing.  Abdominal:     General: Bowel sounds are normal.     Palpations: Abdomen is soft.     Tenderness: There is no abdominal tenderness. There is left CVA tenderness. There is no guarding.  Musculoskeletal:        General: Normal range of motion.     Cervical back: Normal range of motion.  Skin:    General: Skin is warm and dry.  Neurological:     General: No focal deficit present.     Mental Status: She is alert.     ED Results / Procedures / Treatments   Labs (all labs ordered are listed, but only abnormal results are displayed) Labs Reviewed  COMPREHENSIVE METABOLIC PANEL - Abnormal; Notable for the following components:      Result Value   Potassium 3.4 (*)    BUN 21 (*)    Creatinine, Ser 1.27 (*)    Calcium 8.4 (*)    GFR, Estimated 50 (*)    All other components within normal limits  URINALYSIS, ROUTINE W REFLEX MICROSCOPIC - Abnormal; Notable for the following components:   APPearance HAZY (*)    Protein, ur 30 (*)    Leukocytes,Ua LARGE (*)    WBC, UA >50 (*)    All other components within normal limits  LIPASE, BLOOD  CBC    EKG None  Radiology CT Renal Stone Study  Result Date: 03/29/2022 CLINICAL DATA:  Abdominal pain, left  flank pain EXAM: CT ABDOMEN AND PELVIS WITHOUT CONTRAST TECHNIQUE: Multidetector CT imaging of the abdomen and pelvis was performed following the standard protocol without IV contrast. RADIATION DOSE REDUCTION: This exam was performed according to the departmental dose-optimization program which includes automated exposure control, adjustment of the mA and/or kV according to patient size and/or use of iterative reconstruction technique. COMPARISON:  None Available. FINDINGS: Lower chest: Small linear densities are seen in left lower lobe suggesting scarring or minimal subsegmental atelectasis. Hepatobiliary: There are multiple low-density lesions of varying sizes largest measuring 4.9 cm. There is fluid attenuation in these lesions  suggesting possible hepatic cysts. There is no dilation of bile ducts. Surgical clips are seen in gallbladder fossa. Pancreas: No focal abnormalities are seen. Spleen: Unremarkable. Adrenals/Urinary Tract: Adrenals are unremarkable. There is mild left hydronephrosis. There is 8 mm calculus in the proximal left ureter at L3-L4 level. There are multiple bilateral renal stones, more so in the left kidney. There is mild left perinephric stranding. Distal left ureter is not dilated. Urinary bladder is unremarkable. Stomach/Bowel: Stomach is unremarkable. Small bowel loops are not dilated. Appendix is not seen. There is no significant wall thickening in colon. Moderate to large amount of stool is seen in proximal colon. There is no fecal impaction in rectum. Few diverticula are seen in colon without signs of focal acute diverticulitis. Vascular/Lymphatic: Scattered arterial calcifications are seen. Reproductive: Uterus is retroverted.  There are no adnexal masses. Other: There is no ascites or pneumoperitoneum. Umbilical hernia containing fat is seen. Bilateral inguinal hernias containing fat are seen, larger on the left side. Musculoskeletal: Degenerative changes are noted in lumbar spine,  more so at the L2-L3 level. IMPRESSION: There is 8 mm calculus in the proximal left ureter close to the ureteropelvic junction causing mild left hydronephrosis. Bilateral renal stones, more so in the left kidney. There is no evidence of intestinal obstruction or pneumoperitoneum. Multiple hepatic cysts. Few diverticula are seen in colon without signs of focal acute diverticulitis. Moderate to large amount of stool is seen in proximal colon. Other findings as described in the body of the report. Electronically Signed   By: Elmer Picker M.D.   On: 03/29/2022 17:57    Procedures Procedures    Medications Ordered in ED Medications  ketorolac (TORADOL) 30 MG/ML injection 15 mg (15 mg Intravenous Given 03/29/22 1832)  ondansetron (ZOFRAN) injection 4 mg (4 mg Intravenous Given 03/29/22 1832)  HYDROmorphone (DILAUDID) injection 1 mg (1 mg Intravenous Given 03/29/22 1924)  tamsulosin (FLOMAX) capsule 0.4 mg (0.4 mg Oral Given 03/29/22 1827)  sodium chloride 0.9 % bolus 1,000 mL (0 mLs Intravenous Stopped 03/29/22 1950)    ED Course/ Medical Decision Making/ A&P                           Medical Decision Making Patient with left flank pain over the course of several days, worse today, rather sudden onset and similar to prior kidney stone passage.  She has been afebrile.  Differential diagnoses including kidney stones, abscess, ureteral colic, UTI, [place].  Amount and/or Complexity of Data Reviewed Labs: ordered.    Details: Urine significant for leukocytosis, no bacteria, nitrite negative. Radiology: ordered.    Details: 8 mm left ureteral stone, proximal ureteral.  Mild hydronephrosis. Discussion of management or test interpretation with external provider(s): Patient discussed with Dr. Junious Silk of urology.  As she is afebrile she can be disposed home with close office follow-up.   Risk Prescription drug management. Risk Details: Patient was prescribed oxycodone, Flomax and Zofran.  Return  precautions were outlined.  Referral to urology for follow-up care.  She was given a urine strainer for home use.           Final Clinical Impression(s) / ED Diagnoses Final diagnoses:  Kidney stone    Rx / DC Orders ED Discharge Orders          Ordered    oxyCODONE-acetaminophen (PERCOCET/ROXICET) 5-325 MG tablet  Every 4 hours PRN        03/29/22 1953    ondansetron (ZOFRAN)  4 MG tablet  Every 6 hours        03/29/22 1953    tamsulosin (FLOMAX) 0.4 MG CAPS capsule  Daily after supper        03/29/22 1953              Evalee Jefferson, Hershal Coria 03/29/22 2138    Jeanell Sparrow, DO 03/30/22 1418

## 2022-03-29 NOTE — Discharge Instructions (Signed)
Take the medications prescribed as needed for symptom relief.  I also want you to take the Flomax tablet 1 every evening to help you hopefully more easily passed the stone.  Will be very important you follow-up with urology, call Dr. Junious Silk tomorrow to set up an appointment time.  In the interim you need to return here urgently if you develop fevers, uncontrolled pain or uncontrolled vomiting.  Do not drive within 4 hours of taking oxycodone as this medication will make you drowsy.

## 2022-04-03 ENCOUNTER — Encounter: Payer: Self-pay | Admitting: Urology

## 2022-04-03 ENCOUNTER — Ambulatory Visit (INDEPENDENT_AMBULATORY_CARE_PROVIDER_SITE_OTHER): Payer: No Typology Code available for payment source | Admitting: Urology

## 2022-04-03 VITALS — BP 148/97 | HR 134

## 2022-04-03 DIAGNOSIS — N202 Calculus of kidney with calculus of ureter: Secondary | ICD-10-CM

## 2022-04-03 DIAGNOSIS — N200 Calculus of kidney: Secondary | ICD-10-CM

## 2022-04-03 NOTE — Progress Notes (Unsigned)
04/03/2022 3:07 PM   Heidi Payne 09-Feb-1966 [id number]  Referring provider: Caryl Bis, MD [place],  [place] [address]  No chief complaint on file.   HPI:  New pt -   1) left ureteral stone - she had left flank pain and a 03/29/2022 CT revealed a 6 mm left proximal stone. It was difficult to see on the scout. Her WBC was 9.6, Cr 1.27. UA > 50 wbc, no rbc.   2) renal stones - bilateral L>R renal stones about 4 mm on CT 03/29/2022.   She had one other stone which she passed.   Today, seen for the above. She hasn't seen the stone pass. Pain manageable. Felt some left low back pain. Hasn't needed pain meds. She is taking tamsulosin.   She is a Chief Executive Officer for [employer].   PMH: Past Medical History:  Diagnosis Date   Anginal pain (HCC)    Anxiety    Asthma    Back pain    Bilateral swelling of feet    Depression    hitory of   Gallbladder problem    GERD (gastroesophageal reflux disease)    Headache    Hyperlipidemia    Hypertension    Joint pain    Kidney stones    LGSIL of cervix of undetermined significance 03/20/2022   03/20/22 will get colpo   Mental disorder    depression.   Seasonal allergies    Sleep apnea    SOB (shortness of breath)    Vitamin D deficiency    Vitiligo     Surgical History: Past Surgical History:  Procedure Laterality Date   [place] N/A 12/31/2015   Procedure: Left Heart Cath and Coronary Angiography;  Surgeon: Jettie Booze, MD;  Location: [facility];  Service: Cardiovascular;  Laterality: N/A;   CESAREAN SECTION W/BTL  05/16/2003   CHOLECYSTECTOMY  11/27/2011   Procedure: LAPAROSCOPIC CHOLECYSTECTOMY;  Surgeon: Jamesetta So, MD;  Location: [facility];  Service: General;  Laterality: N/A;   COLONOSCOPY  03/19/2012   Procedure: COLONOSCOPY;  Surgeon: Jamesetta So, MD;  [facility];  Service: Gastroenterology;  Laterality: N/A;   COLONOSCOPY N/A  10/26/2020   Procedure: COLONOSCOPY;  Surgeon: Aviva Signs, MD;  [facility];  Service: Gastroenterology;  Laterality: N/A;   COLPOSCOPY  01/11/2015   DILATION AND CURETTAGE OF UTERUS     ENDOMETRIAL BIOPSY  01/14/2019   LAPAROSCOPIC SALPINGO OOPHERECTOMY Bilateral 01/22/2019   Procedure: LAPAROSCOPIC SALPINGO OOPHORECTOMY;  Surgeon: Florian Buff, MD;  Location: [facility];  Service: Gynecology;  Laterality: Bilateral;   TUBAL LIGATION      Home Medications:  Allergies as of 04/03/2022   No Known Allergies      Medication List        Accurate as of April 03, 2022  3:07 PM. If you have any questions, ask your nurse or doctor.          acetaminophen 500 MG tablet Commonly known as: TYLENOL Take 500 mg by mouth every 6 (six) hours as needed.   albuterol 108 (90 Base) MCG/ACT inhaler Commonly known as: VENTOLIN HFA Inhale 2 puffs into the lungs every 6 (six) hours as needed for wheezing or shortness of breath.   amLODipine 2.5 MG tablet Commonly known as: NORVASC Take 2.5 mg by mouth daily.   atorvastatin 40 MG tablet Commonly known as: LIPITOR Take  40 mg by mouth at bedtime.   buPROPion 150 MG 24 hr tablet Commonly known as: WELLBUTRIN XL Take 150 mg by mouth daily.   clobetasol cream 0.05 % Commonly known as: TEMOVATE Apply 1 Application topically 2 (two) times daily.   DULoxetine 60 MG capsule Commonly known as: CYMBALTA Take 60 mg by mouth 2 (two) times daily.   estradiol 0.1 MG/GM vaginal cream Commonly known as: ESTRACE VAGINAL Use 0.5 gm 2 x weekly in vagina   Fluticasone Propionate (Inhal) 55 MCG/ACT Aepb Inhale into the lungs.   ibuprofen 200 MG tablet Commonly known as: ADVIL Take 200 mg by mouth every 6 (six) hours as needed.   loratadine 10 MG tablet Commonly known as: CLARITIN Take 10 mg by mouth daily.   losartan 25 MG tablet Commonly known as: COZAAR Take 25 mg by mouth daily.   montelukast 10 MG tablet Commonly known  as: SINGULAIR Take 10 mg by mouth at bedtime.   multivitamin with minerals tablet Take 2 tablets by mouth daily.   omeprazole 40 MG capsule Commonly known as: PRILOSEC Take 40 mg by mouth daily.   ondansetron 4 MG tablet Commonly known as: ZOFRAN Take 1 tablet (4 mg total) by mouth every 6 (six) hours.   oxyCODONE-acetaminophen 5-325 MG tablet Commonly known as: PERCOCET/ROXICET Take 1 tablet by mouth every 4 (four) hours as needed.   phentermine 15 MG capsule Take 1 capsule (15 mg total) by mouth every morning.   pramoxine-hydrocortisone 1-1 % rectal cream Commonly known as: PROCTOCREAM-HC Place 1 Application rectally 2 (two) times daily.   tamsulosin 0.4 MG Caps capsule Commonly known as: FLOMAX Take 1 capsule (0.4 mg total) by mouth daily after supper.   topiramate 100 MG tablet Commonly known as: Topamax Take 1 tablet (100 mg total) by mouth daily.   Vitamin D (Ergocalciferol) 1.25 MG (50000 UNIT) Caps capsule Commonly known as: DRISDOL Take 1 capsule (50,000 Units total) by mouth every 7 (seven) days.        Allergies: No Known Allergies  Family History: Family History  Problem Relation Age of Onset   Cancer Mother        bone brain cervical   Hyperlipidemia Father    Cancer Father        colon   Diabetes Father    Hypertension Father    Cancer Maternal Aunt        breast   Cancer Maternal Aunt        breast   Cancer Maternal Aunt        breast   Cancer Paternal Aunt        esophageal, lung    Social History:  reports that she has never smoked. She has never used smokeless tobacco. She reports that she does not currently use alcohol. She reports that she does not use drugs.   Physical Exam: BP (!) 148/97   Pulse (!) 134   LMP 12/12/2018   Constitutional:  Alert and oriented, No acute distress. HEENT: [place] AT, moist mucus membranes.  Trachea midline, no masses. Cardiovascular: No clubbing, cyanosis, or edema. Respiratory: Normal respiratory  effort, no increased work of breathing. GI: Abdomen is soft, nontender, nondistended, no abdominal masses GU: No CVA tenderness Skin: No rashes, bruises or suspicious lesions. Neurologic: Grossly intact, no focal deficits, moving all 4 extremities. Psychiatric: Normal mood and affect.  Laboratory Data: Lab Results  Component Value Date   WBC 9.6 03/29/2022   HGB 13.2 03/29/2022   HCT  38.7 03/29/2022   MCV 86.2 03/29/2022   PLT 243 03/29/2022    Lab Results  Component Value Date   CREATININE 1.27 (H) 03/29/2022    No results found for: "PSA"  No results found for: "TESTOSTERONE"  Lab Results  Component Value Date   HGBA1C 5.7 02/07/2022    Urinalysis    Component Value Date/Time   COLORURINE YELLOW 03/29/2022 1515   APPEARANCEUR HAZY (A) 03/29/2022 1515   LABSPEC 1.023 03/29/2022 1515   PHURINE 6.0 03/29/2022 1515   GLUCOSEU NEGATIVE 03/29/2022 [place] 03/29/2022 [place] 03/29/2022 1515   KETONESUR NEGATIVE 03/29/2022 1515   PROTEINUR 30 (A) 03/29/2022 1515   NITRITE NEGATIVE 03/29/2022 1515   LEUKOCYTESUR LARGE (A) 03/29/2022 1515    Lab Results  Component Value Date   BACTERIA NONE SEEN 03/29/2022    Pertinent Imaging: Ct images  No results found for this or any previous visit.  Results for orders placed during the hospital encounter of 03/29/22  CT Renal Stone Study  Narrative CLINICAL DATA:  Abdominal pain, left flank pain  EXAM: CT ABDOMEN AND PELVIS WITHOUT CONTRAST  TECHNIQUE: Multidetector CT imaging of the abdomen and pelvis was performed following the standard protocol without IV contrast.  RADIATION DOSE REDUCTION: This exam was performed according to the departmental dose-optimization program which includes automated exposure control, adjustment of the mA and/or kV according to patient size and/or use of iterative reconstruction technique.  COMPARISON:  None Available.  FINDINGS: Lower chest:  Small linear densities are seen in left lower lobe suggesting scarring or minimal subsegmental atelectasis.  Hepatobiliary: There are multiple low-density lesions of varying sizes largest measuring 4.9 cm. There is fluid attenuation in these lesions suggesting possible hepatic cysts. There is no dilation of bile ducts. Surgical clips are seen in gallbladder fossa.  Pancreas: No focal abnormalities are seen.  Spleen: Unremarkable.  Adrenals/Urinary Tract: Adrenals are unremarkable. There is mild left hydronephrosis. There is 8 mm calculus in the proximal left ureter at L3-L4 level. There are multiple bilateral renal stones, more so in the left kidney. There is mild left perinephric stranding. Distal left ureter is not dilated. Urinary bladder is unremarkable.  Stomach/Bowel: Stomach is unremarkable. Small bowel loops are not dilated. Appendix is not seen. There is no significant wall thickening in colon. Moderate to large amount of stool is seen in proximal colon. There is no fecal impaction in rectum. Few diverticula are seen in colon without signs of focal acute diverticulitis.  Vascular/Lymphatic: Scattered arterial calcifications are seen.  Reproductive: Uterus is retroverted.  There are no adnexal masses.  Other: There is no ascites or pneumoperitoneum. Umbilical hernia containing fat is seen. Bilateral inguinal hernias containing fat are seen, larger on the left side.  Musculoskeletal: Degenerative changes are noted in lumbar spine, more so at the L2-L3 level.  IMPRESSION: There is 8 mm calculus in the proximal left ureter close to the ureteropelvic junction causing mild left hydronephrosis. Bilateral renal stones, more so in the left kidney.  There is no evidence of intestinal obstruction or pneumoperitoneum.  Multiple hepatic cysts. Few diverticula are seen in colon without signs of focal acute diverticulitis. Moderate to large amount of stool is seen in proximal  colon.  Other findings as described in the body of the report.   Electronically Signed By: Elmer Picker M.D. On: 03/29/2022 17:57   Assessment & Plan:    1. Kidney stones Disc stones in kidneys and dietary changes to  prevent.  - Urinalysis, Routine w reflex microscopic  2. Left ureteral stone - disc nature r/b of cont stone passage, off label use of alpha blocker, ESWL and URS.    No follow-ups on file.  Festus Aloe, MD  [facility]  [address] [place], [place] [address] [phone]

## 2022-04-04 LAB — URINALYSIS, ROUTINE W REFLEX MICROSCOPIC
Bilirubin, UA: NEGATIVE
Glucose, UA: NEGATIVE
Ketones, UA: NEGATIVE
Nitrite, UA: NEGATIVE
Protein,UA: NEGATIVE
Specific Gravity, UA: 1.02 (ref 1.005–1.030)
Urobilinogen, Ur: 1 mg/dL (ref 0.2–1.0)
pH, UA: 5.5 (ref 5.0–7.5)

## 2022-04-04 LAB — MICROSCOPIC EXAMINATION: Bacteria, UA: NONE SEEN

## 2022-04-14 ENCOUNTER — Other Ambulatory Visit (INDEPENDENT_AMBULATORY_CARE_PROVIDER_SITE_OTHER): Payer: Self-pay | Admitting: Family Medicine

## 2022-04-14 DIAGNOSIS — R632 Polyphagia: Secondary | ICD-10-CM

## 2022-04-17 ENCOUNTER — Ambulatory Visit (INDEPENDENT_AMBULATORY_CARE_PROVIDER_SITE_OTHER): Payer: No Typology Code available for payment source | Admitting: Urology

## 2022-04-17 ENCOUNTER — Ambulatory Visit (HOSPITAL_COMMUNITY)
Admission: RE | Admit: 2022-04-17 | Discharge: 2022-04-17 | Disposition: A | Discharge status: Home / Self Care | Payer: No Typology Code available for payment source | Source: Ambulatory Visit | Attending: Urology | Admitting: Urology

## 2022-04-17 ENCOUNTER — Ambulatory Visit (INDEPENDENT_AMBULATORY_CARE_PROVIDER_SITE_OTHER): Payer: No Typology Code available for payment source | Admitting: Family Medicine

## 2022-04-17 ENCOUNTER — Encounter: Payer: Self-pay | Admitting: Urology

## 2022-04-17 VITALS — BP 156/87 | HR 66

## 2022-04-17 DIAGNOSIS — N200 Calculus of kidney: Secondary | ICD-10-CM

## 2022-04-17 NOTE — Progress Notes (Unsigned)
04/17/2022 3:21 PM   Heidi Payne 10-04-1965 [id number]  Referring provider: Caryl Bis, MD [place],  [place] [address]  No chief complaint on file.   HPI:  F/u -   1) left ureteral stone - she had left flank pain and a 03/29/2022 CT revealed a 6 mm left proximal stone. It was difficult to see on the scout. Her WBC was 9.6, Cr 1.27. UA > 50 wbc, no rbc.    2) renal stones - bilateral L>R renal stones about 4 mm on CT 03/29/2022.    She had one other stone which she passed.    Today, seen for the above. She hasn't seen a stone pass. She last had pain about 5 days ago. She is straining her urine. No fever or hematuria. Mild dysuria.      She is a Chief Executive Officer for [employer].  PMH: Past Medical History:  Diagnosis Date   Anginal pain (HCC)    Anxiety    Asthma    Back pain    Bilateral swelling of feet    Depression    hitory of   Gallbladder problem    GERD (gastroesophageal reflux disease)    Headache    Hyperlipidemia    Hypertension    Joint pain    Kidney stones    LGSIL of cervix of undetermined significance 03/20/2022   03/20/22 will get colpo   Mental disorder    depression.   Seasonal allergies    Sleep apnea    SOB (shortness of breath)    Vitamin D deficiency    Vitiligo     Surgical History: Past Surgical History:  Procedure Laterality Date   [place] N/A 12/31/2015   Procedure: Left Heart Cath and Coronary Angiography;  Surgeon: Jettie Booze, MD;  Location: [facility];  Service: Cardiovascular;  Laterality: N/A;   CESAREAN SECTION W/BTL  05/16/2003   CHOLECYSTECTOMY  11/27/2011   Procedure: LAPAROSCOPIC CHOLECYSTECTOMY;  Surgeon: Jamesetta So, MD;  Location: [facility];  Service: General;  Laterality: N/A;   COLONOSCOPY  03/19/2012   Procedure: COLONOSCOPY;  Surgeon: Jamesetta So, MD;  [facility];  Service: Gastroenterology;  Laterality: N/A;   COLONOSCOPY  N/A 10/26/2020   Procedure: COLONOSCOPY;  Surgeon: Aviva Signs, MD;  [facility];  Service: Gastroenterology;  Laterality: N/A;   COLPOSCOPY  01/11/2015   DILATION AND CURETTAGE OF UTERUS     ENDOMETRIAL BIOPSY  01/14/2019   LAPAROSCOPIC SALPINGO OOPHERECTOMY Bilateral 01/22/2019   Procedure: LAPAROSCOPIC SALPINGO OOPHORECTOMY;  Surgeon: Florian Buff, MD;  Location: [facility];  Service: Gynecology;  Laterality: Bilateral;   TUBAL LIGATION      Home Medications:  Allergies as of 04/17/2022   No Known Allergies      Medication List        Accurate as of April 17, 2022  3:21 PM. If you have any questions, ask your nurse or doctor.          acetaminophen 500 MG tablet Commonly known as: TYLENOL Take 500 mg by mouth every 6 (six) hours as needed.   albuterol 108 (90 Base) MCG/ACT inhaler Commonly known as: VENTOLIN HFA Inhale 2 puffs into the lungs every 6 (six) hours as needed for wheezing or shortness of breath.   amLODipine 2.5 MG tablet Commonly known as: NORVASC Take 2.5 mg by mouth daily.   atorvastatin 40  MG tablet Commonly known as: LIPITOR Take 40 mg by mouth at bedtime.   buPROPion 150 MG 24 hr tablet Commonly known as: WELLBUTRIN XL Take 150 mg by mouth daily.   clobetasol cream 0.05 % Commonly known as: TEMOVATE Apply 1 Application topically 2 (two) times daily.   DULoxetine 60 MG capsule Commonly known as: CYMBALTA Take 60 mg by mouth 2 (two) times daily.   estradiol 0.1 MG/GM vaginal cream Commonly known as: ESTRACE VAGINAL Use 0.5 gm 2 x weekly in vagina   Fluticasone Propionate (Inhal) 55 MCG/ACT Aepb Inhale into the lungs.   ibuprofen 200 MG tablet Commonly known as: ADVIL Take 200 mg by mouth every 6 (six) hours as needed.   loratadine 10 MG tablet Commonly known as: CLARITIN Take 10 mg by mouth daily.   losartan 25 MG tablet Commonly known as: COZAAR Take 25 mg by mouth daily.   montelukast 10 MG tablet Commonly  known as: SINGULAIR Take 10 mg by mouth at bedtime.   multivitamin with minerals tablet Take 2 tablets by mouth daily.   omeprazole 40 MG capsule Commonly known as: PRILOSEC Take 40 mg by mouth daily.   ondansetron 4 MG tablet Commonly known as: ZOFRAN Take 1 tablet (4 mg total) by mouth every 6 (six) hours.   oxyCODONE-acetaminophen 5-325 MG tablet Commonly known as: PERCOCET/ROXICET Take 1 tablet by mouth every 4 (four) hours as needed.   phentermine 15 MG capsule Take 1 capsule (15 mg total) by mouth every morning.   pramoxine-hydrocortisone 1-1 % rectal cream Commonly known as: PROCTOCREAM-HC Place 1 Application rectally 2 (two) times daily.   tamsulosin 0.4 MG Caps capsule Commonly known as: FLOMAX Take 1 capsule (0.4 mg total) by mouth daily after supper.   topiramate 100 MG tablet Commonly known as: Topamax Take 1 tablet (100 mg total) by mouth daily.   Vitamin D (Ergocalciferol) 1.25 MG (50000 UNIT) Caps capsule Commonly known as: DRISDOL Take 1 capsule (50,000 Units total) by mouth every 7 (seven) days.        Allergies: No Known Allergies  Family History: Family History  Problem Relation Age of Onset   Cancer Mother        bone brain cervical   Hyperlipidemia Father    Cancer Father        colon   Diabetes Father    Hypertension Father    Cancer Maternal Aunt        breast   Cancer Maternal Aunt        breast   Cancer Maternal Aunt        breast   Cancer Paternal Aunt        esophageal, lung    Social History:  reports that she has never smoked. She has never used smokeless tobacco. She reports that she does not currently use alcohol. She reports that she does not use drugs.   Physical Exam: BP (!) 156/87   Pulse 66   LMP 12/12/2018   Constitutional:  Alert and oriented, No acute distress. HEENT: [place] AT, moist mucus membranes.  Trachea midline, no masses. Cardiovascular: No clubbing, cyanosis, or edema. Respiratory: Normal respiratory  effort, no increased work of breathing. GI: Abdomen is soft, nontender, nondistended, no abdominal masses GU: No CVA tenderness Skin: No rashes, bruises or suspicious lesions. Neurologic: Grossly intact, no focal deficits, moving all 4 extremities. Psychiatric: Normal mood and affect.  Laboratory Data: Lab Results  Component Value Date   WBC 9.6 03/29/2022  HGB 13.2 03/29/2022   HCT 38.7 03/29/2022   MCV 86.2 03/29/2022   PLT 243 03/29/2022    Lab Results  Component Value Date   CREATININE 1.27 (H) 03/29/2022    No results found for: "PSA"  No results found for: "TESTOSTERONE"  Lab Results  Component Value Date   HGBA1C 5.7 02/07/2022    Urinalysis    Component Value Date/Time   COLORURINE YELLOW 03/29/2022 1515   APPEARANCEUR Hazy (A) 04/03/2022 1538   LABSPEC 1.023 03/29/2022 1515   PHURINE 6.0 03/29/2022 1515   GLUCOSEU Negative 04/03/2022 1538   HGBUR NEGATIVE 03/29/2022 1515   BILIRUBINUR Negative 04/03/2022 1538   KETONESUR NEGATIVE 03/29/2022 1515   PROTEINUR Negative 04/03/2022 1538   PROTEINUR 30 (A) 03/29/2022 1515   NITRITE Negative 04/03/2022 1538   NITRITE NEGATIVE 03/29/2022 1515   LEUKOCYTESUR 1+ (A) 04/03/2022 1538   LEUKOCYTESUR LARGE (A) 03/29/2022 1515    Lab Results  Component Value Date   LABMICR See below: 04/03/2022   WBCUA 6-10 (A) 04/03/2022   LABEPIT 0-10 04/03/2022   BACTERIA None seen 04/03/2022    Pertinent Imaging: Reviewed ct images   No valid procedures specified. No results found for this or any previous visit.  Results for orders placed during the hospital encounter of 03/29/22  CT Renal Stone Study  Narrative CLINICAL DATA:  Abdominal pain, left flank pain  EXAM: CT ABDOMEN AND PELVIS WITHOUT CONTRAST  TECHNIQUE: Multidetector CT imaging of the abdomen and pelvis was performed following the standard protocol without IV contrast.  RADIATION DOSE REDUCTION: This exam was performed according to  the departmental dose-optimization program which includes automated exposure control, adjustment of the mA and/or kV according to patient size and/or use of iterative reconstruction technique.  COMPARISON:  None Available.  FINDINGS: Lower chest: Small linear densities are seen in left lower lobe suggesting scarring or minimal subsegmental atelectasis.  Hepatobiliary: There are multiple low-density lesions of varying sizes largest measuring 4.9 cm. There is fluid attenuation in these lesions suggesting possible hepatic cysts. There is no dilation of bile ducts. Surgical clips are seen in gallbladder fossa.  Pancreas: No focal abnormalities are seen.  Spleen: Unremarkable.  Adrenals/Urinary Tract: Adrenals are unremarkable. There is mild left hydronephrosis. There is 8 mm calculus in the proximal left ureter at L3-L4 level. There are multiple bilateral renal stones, more so in the left kidney. There is mild left perinephric stranding. Distal left ureter is not dilated. Urinary bladder is unremarkable.  Stomach/Bowel: Stomach is unremarkable. Small bowel loops are not dilated. Appendix is not seen. There is no significant wall thickening in colon. Moderate to large amount of stool is seen in proximal colon. There is no fecal impaction in rectum. Few diverticula are seen in colon without signs of focal acute diverticulitis.  Vascular/Lymphatic: Scattered arterial calcifications are seen.  Reproductive: Uterus is retroverted.  There are no adnexal masses.  Other: There is no ascites or pneumoperitoneum. Umbilical hernia containing fat is seen. Bilateral inguinal hernias containing fat are seen, larger on the left side.  Musculoskeletal: Degenerative changes are noted in lumbar spine, more so at the L2-L3 level.  IMPRESSION: There is 8 mm calculus in the proximal left ureter close to the ureteropelvic junction causing mild left hydronephrosis. Bilateral renal stones, more  so in the left kidney.  There is no evidence of intestinal obstruction or pneumoperitoneum.  Multiple hepatic cysts. Few diverticula are seen in colon without signs of focal acute diverticulitis. Moderate to large amount of  stool is seen in proximal colon.  Other findings as described in the body of the report.   Electronically Signed By: Elmer Picker M.D. On: 03/29/2022 17:57   Assessment & Plan:    1. Kidney stones Check KUB  - Urinalysis, Routine w reflex microscopic  2. left ureteral stone - check KUB and if visible schedule left ESWL. If not get a left renal US and consider left URS/HLL/stent/ Disc the nature r/b/a to these procedures.   No follow-ups on file.  Festus Aloe, MD  [facility]  [address] [place], [place] [address] [phone]

## 2022-04-18 LAB — MICROSCOPIC EXAMINATION: Bacteria, UA: NONE SEEN

## 2022-04-18 LAB — URINALYSIS, ROUTINE W REFLEX MICROSCOPIC
Bilirubin, UA: NEGATIVE
Glucose, UA: NEGATIVE
Ketones, UA: NEGATIVE
Nitrite, UA: NEGATIVE
Protein,UA: NEGATIVE
RBC, UA: NEGATIVE
Specific Gravity, UA: 1.015 (ref 1.005–1.030)
Urobilinogen, Ur: 0.2 mg/dL (ref 0.2–1.0)
pH, UA: 7 (ref 5.0–7.5)

## 2022-04-19 ENCOUNTER — Encounter (INDEPENDENT_AMBULATORY_CARE_PROVIDER_SITE_OTHER): Payer: Self-pay | Admitting: Family Medicine

## 2022-04-19 ENCOUNTER — Ambulatory Visit (INDEPENDENT_AMBULATORY_CARE_PROVIDER_SITE_OTHER): Payer: No Typology Code available for payment source | Admitting: Family Medicine

## 2022-04-19 VITALS — BP 125/85 | HR 95 | Temp 98.8°F | Ht 66.0 in | Wt 185.0 lb

## 2022-04-19 DIAGNOSIS — R632 Polyphagia: Secondary | ICD-10-CM

## 2022-04-19 DIAGNOSIS — Z6829 Body mass index (BMI) 29.0-29.9, adult: Secondary | ICD-10-CM | POA: Diagnosis not present

## 2022-04-19 DIAGNOSIS — E559 Vitamin D deficiency, unspecified: Secondary | ICD-10-CM | POA: Diagnosis not present

## 2022-04-19 DIAGNOSIS — E669 Obesity, unspecified: Secondary | ICD-10-CM

## 2022-04-19 DIAGNOSIS — Z9189 Other specified personal risk factors, not elsewhere classified: Secondary | ICD-10-CM | POA: Diagnosis not present

## 2022-04-19 MED ORDER — PHENTERMINE HCL 15 MG PO CAPS: 15.0000 mg | ORAL_CAPSULE | ORAL | 0 refills | Status: DC

## 2022-04-19 MED ORDER — VITAMIN D (ERGOCALCIFEROL) 1.25 MG (50000 UNIT) PO CAPS: 50000.0000 [IU] | ORAL_CAPSULE | ORAL | 0 refills | Status: DC

## 2022-04-20 ENCOUNTER — Encounter: Payer: Self-pay | Admitting: Urology

## 2022-04-21 ENCOUNTER — Telehealth: Payer: Self-pay

## 2022-04-21 NOTE — Telephone Encounter (Signed)
Made patient aware that her stone was seen on the KUB and Dr. Junious Silk is getting her set up for ESWL and a message was sent to [facility] the surgery scheduler. Patient voiced understanding

## 2022-04-21 NOTE — Telephone Encounter (Signed)
-----   Message from Festus Aloe, MD sent at 04/20/2022  5:28 PM EST ----- Let patient know her stone is visible on KUB and I'll get her set up for left ESWL as planned. I sent a request to [facility].   ----- Message ----- From: Sherrilyn Rist, CMA Sent: 04/20/2022   8:30 AM EST To: Festus Aloe, MD  Please review

## 2022-04-25 ENCOUNTER — Telehealth: Payer: Self-pay

## 2022-04-25 NOTE — Telephone Encounter (Signed)
Patient left a voice message 04-25-22.  Checking on litho procedure status. Please call pt back to discuss date and time.  Call back:  [phone]  Thanks, Helene Kelp

## 2022-04-27 ENCOUNTER — Other Ambulatory Visit: Payer: Self-pay

## 2022-04-27 ENCOUNTER — Other Ambulatory Visit: Payer: Self-pay | Admitting: Urology

## 2022-04-27 DIAGNOSIS — N200 Calculus of kidney: Secondary | ICD-10-CM

## 2022-04-27 NOTE — Telephone Encounter (Signed)
Patient unable to have ESWL at [facility] due to schedule and recent Phenetamine prescription taken. Discussed with Dr. Junious Silk, patient will go to [facility] for ESWL next Thursday and [facility] scheduler will reach out to patient. Patient called and made aware and voiced understanding. Patient understands to hold phenetamine 7 days prior

## 2022-04-27 NOTE — Telephone Encounter (Signed)
Discussed with Dr. Junious Silk- patient will go to [facility] on 05/04/2022 for ESWL arrival at 6:45. Discussed with patient, patient voiced understanding of arrival time and will come by office on Monday to have ESWL paperwork completed

## 2022-04-27 NOTE — Telephone Encounter (Signed)
I reached out to patient today- patient upset that she had not been contacted prior to today. I informed patient I just received a message that patient would need ESWL and we would get her scheduled for next Tuesday if time appropriate with patient current med list.  Patient reassured and is understanding of ESWL schedule. Patient took Phentermine on 04/26/2022. Waiting for pre op to confirm if patient can proceed with ESWL on 05/02/2022

## 2022-04-28 NOTE — H&P (Signed)
F/u -   1) left ureteral stone - she had left flank pain and a 03/29/2022 CT revealed a 6 mm left proximal stone. It was difficult to see on the scout. Her WBC was 9.6, Cr 1.27. UA > 50 wbc, no rbc.    2) renal stones - bilateral L>R renal stones about 4 mm on CT 03/29/2022.    She had one other stone which she passed.    Today, seen for the above. She hasn't seen a stone pass. She last had pain about 5 days ago. She is straining her urine. No fever or hematuria. Mild dysuria.       She is a Chief Executive Officer for [employer].   PMH:     Past Medical History:  Diagnosis Date   Anginal pain (HCC)     Anxiety     Asthma     Back pain     Bilateral swelling of feet     Depression      hitory of   Gallbladder problem     GERD (gastroesophageal reflux disease)     Headache     Hyperlipidemia     Hypertension     Joint pain     Kidney stones     LGSIL of cervix of undetermined significance 03/20/2022    03/20/22 will get colpo   Mental disorder      depression.   Seasonal allergies     Sleep apnea     SOB (shortness of breath)     Vitamin D deficiency     Vitiligo        Surgical History:      Past Surgical History:  Procedure Laterality Date   [place] N/A 12/31/2015    Procedure: Left Heart Cath and Coronary Angiography;  Surgeon: Jettie Booze, MD;  Location: [facility];  Service: Cardiovascular;  Laterality: N/A;   CESAREAN SECTION W/BTL   05/16/2003   CHOLECYSTECTOMY   11/27/2011    Procedure: LAPAROSCOPIC CHOLECYSTECTOMY;  Surgeon: Jamesetta So, MD;  Location: [facility];  Service: General;  Laterality: N/A;   COLONOSCOPY   03/19/2012    Procedure: COLONOSCOPY;  Surgeon: Jamesetta So, MD;  Location: [facility];  Service: Gastroenterology;  Laterality: N/A;   COLONOSCOPY N/A 10/26/2020    Procedure: COLONOSCOPY;  Surgeon: Aviva Signs, MD;  Location: [facility];  Service: Gastroenterology;  Laterality: N/A;    COLPOSCOPY   01/11/2015   DILATION AND CURETTAGE OF UTERUS       ENDOMETRIAL BIOPSY   01/14/2019   LAPAROSCOPIC SALPINGO OOPHERECTOMY Bilateral 01/22/2019    Procedure: LAPAROSCOPIC SALPINGO OOPHORECTOMY;  Surgeon: Florian Buff, MD;  Location: [facility];  Service: Gynecology;  Laterality: Bilateral;   TUBAL LIGATION          Home Medications:  Allergies as of 04/17/2022   No Known Allergies         Medication List           Accurate as of April 17, 2022  3:21 PM. If you have any questions, ask your nurse or doctor.              acetaminophen 500 MG tablet Commonly known as: TYLENOL Take 500 mg by mouth every 6 (six) hours as needed.    albuterol 108 (90 Base) MCG/ACT inhaler Commonly known as: VENTOLIN HFA Inhale 2 puffs into the lungs every 6 (six)  hours as needed for wheezing or shortness of breath.    amLODipine 2.5 MG tablet Commonly known as: NORVASC Take 2.5 mg by mouth daily.    atorvastatin 40 MG tablet Commonly known as: LIPITOR Take 40 mg by mouth at bedtime.    buPROPion 150 MG 24 hr tablet Commonly known as: WELLBUTRIN XL Take 150 mg by mouth daily.    clobetasol cream 0.05 % Commonly known as: TEMOVATE Apply 1 Application topically 2 (two) times daily.    DULoxetine 60 MG capsule Commonly known as: CYMBALTA Take 60 mg by mouth 2 (two) times daily.    estradiol 0.1 MG/GM vaginal cream Commonly known as: ESTRACE VAGINAL Use 0.5 gm 2 x weekly in vagina    Fluticasone Propionate (Inhal) 55 MCG/ACT Aepb Inhale into the lungs.    ibuprofen 200 MG tablet Commonly known as: ADVIL Take 200 mg by mouth every 6 (six) hours as needed.    loratadine 10 MG tablet Commonly known as: CLARITIN Take 10 mg by mouth daily.    losartan 25 MG tablet Commonly known as: COZAAR Take 25 mg by mouth daily.    montelukast 10 MG tablet Commonly known as: SINGULAIR Take 10 mg by mouth at bedtime.    multivitamin with minerals tablet Take 2 tablets by  mouth daily.    omeprazole 40 MG capsule Commonly known as: PRILOSEC Take 40 mg by mouth daily.    ondansetron 4 MG tablet Commonly known as: ZOFRAN Take 1 tablet (4 mg total) by mouth every 6 (six) hours.    oxyCODONE-acetaminophen 5-325 MG tablet Commonly known as: PERCOCET/ROXICET Take 1 tablet by mouth every 4 (four) hours as needed.    phentermine 15 MG capsule Take 1 capsule (15 mg total) by mouth every morning.    pramoxine-hydrocortisone 1-1 % rectal cream Commonly known as: PROCTOCREAM-HC Place 1 Application rectally 2 (two) times daily.    tamsulosin 0.4 MG Caps capsule Commonly known as: FLOMAX Take 1 capsule (0.4 mg total) by mouth daily after supper.    topiramate 100 MG tablet Commonly known as: Topamax Take 1 tablet (100 mg total) by mouth daily.    Vitamin D (Ergocalciferol) 1.25 MG (50000 UNIT) Caps capsule Commonly known as: DRISDOL Take 1 capsule (50,000 Units total) by mouth every 7 (seven) days.             Allergies: No Known Allergies   Family History:      Family History  Problem Relation Age of Onset   Cancer Mother          bone brain cervical   Hyperlipidemia Father     Cancer Father          colon   Diabetes Father     Hypertension Father     Cancer Maternal Aunt          breast   Cancer Maternal Aunt          breast   Cancer Maternal Aunt          breast   Cancer Paternal Aunt          esophageal, lung      Social History:  reports that she has never smoked. She has never used smokeless tobacco. She reports that she does not currently use alcohol. She reports that she does not use drugs.     Physical Exam: BP (!) 156/87   Pulse 66   LMP 12/12/2018   Constitutional:  Alert and oriented, No acute  distress. HEENT: [place] AT, moist mucus membranes.  Trachea midline, no masses. Cardiovascular: No clubbing, cyanosis, or edema. Respiratory: Normal respiratory effort, no increased work of breathing. GI: Abdomen is soft,  nontender, nondistended, no abdominal masses GU: No CVA tenderness Skin: No rashes, bruises or suspicious lesions. Neurologic: Grossly intact, no focal deficits, moving all 4 extremities. Psychiatric: Normal mood and affect.   Laboratory Data: Recent Labs       Lab Results  Component Value Date    WBC 9.6 03/29/2022    HGB 13.2 03/29/2022    HCT 38.7 03/29/2022    MCV 86.2 03/29/2022    PLT 243 03/29/2022        Recent Labs       Lab Results  Component Value Date    CREATININE 1.27 (H) 03/29/2022        Recent Labs  No results found for: "PSA"     Recent Labs  No results found for: "TESTOSTERONE"     Recent Labs       Lab Results  Component Value Date    HGBA1C 5.7 02/07/2022        Urinalysis Labs (Brief)          Component Value Date/Time    COLORURINE YELLOW 03/29/2022 1515    APPEARANCEUR Hazy (A) 04/03/2022 1538    LABSPEC 1.023 03/29/2022 1515    PHURINE 6.0 03/29/2022 1515    GLUCOSEU Negative 04/03/2022 1538    HGBUR NEGATIVE 03/29/2022 1515    BILIRUBINUR Negative 04/03/2022 1538    KETONESUR NEGATIVE 03/29/2022 1515    PROTEINUR Negative 04/03/2022 1538    PROTEINUR 30 (A) 03/29/2022 1515    NITRITE Negative 04/03/2022 1538    NITRITE NEGATIVE 03/29/2022 1515    LEUKOCYTESUR 1+ (A) 04/03/2022 1538    LEUKOCYTESUR LARGE (A) 03/29/2022 1515        Recent Labs       Lab Results  Component Value Date    LABMICR See below: 04/03/2022    WBCUA 6-10 (A) 04/03/2022    LABEPIT 0-10 04/03/2022    BACTERIA None seen 04/03/2022        Pertinent Imaging: Reviewed ct images    No valid procedures specified. No results found for this or any previous visit.   Results for orders placed during the hospital encounter of 03/29/22   CT Renal Stone Study   Narrative CLINICAL DATA:  Abdominal pain, left flank pain   EXAM: CT ABDOMEN AND PELVIS WITHOUT CONTRAST   TECHNIQUE: Multidetector CT imaging of the abdomen and pelvis was  performed following the standard protocol without IV contrast.   RADIATION DOSE REDUCTION: This exam was performed according to the departmental dose-optimization program which includes automated exposure control, adjustment of the mA and/or kV according to patient size and/or use of iterative reconstruction technique.   COMPARISON:  None Available.   FINDINGS: Lower chest: Small linear densities are seen in left lower lobe suggesting scarring or minimal subsegmental atelectasis.   Hepatobiliary: There are multiple low-density lesions of varying sizes largest measuring 4.9 cm. There is fluid attenuation in these lesions suggesting possible hepatic cysts. There is no dilation of bile ducts. Surgical clips are seen in gallbladder fossa.   Pancreas: No focal abnormalities are seen.   Spleen: Unremarkable.   Adrenals/Urinary Tract: Adrenals are unremarkable. There is mild left hydronephrosis. There is 8 mm calculus in the proximal left ureter at L3-L4 level. There are multiple bilateral renal stones, more so in the left kidney.  There is mild left perinephric stranding. Distal left ureter is not dilated. Urinary bladder is unremarkable.   Stomach/Bowel: Stomach is unremarkable. Small bowel loops are not dilated. Appendix is not seen. There is no significant wall thickening in colon. Moderate to large amount of stool is seen in proximal colon. There is no fecal impaction in rectum. Few diverticula are seen in colon without signs of focal acute diverticulitis.   Vascular/Lymphatic: Scattered arterial calcifications are seen.   Reproductive: Uterus is retroverted.  There are no adnexal masses.   Other: There is no ascites or pneumoperitoneum. Umbilical hernia containing fat is seen. Bilateral inguinal hernias containing fat are seen, larger on the left side.   Musculoskeletal: Degenerative changes are noted in lumbar spine, more so at the L2-L3 level.   IMPRESSION: There is 8  mm calculus in the proximal left ureter close to the ureteropelvic junction causing mild left hydronephrosis. Bilateral renal stones, more so in the left kidney.   There is no evidence of intestinal obstruction or pneumoperitoneum.   Multiple hepatic cysts. Few diverticula are seen in colon without signs of focal acute diverticulitis. Moderate to large amount of stool is seen in proximal colon.   Other findings as described in the body of the report.     Electronically Signed By: Elmer Picker M.D. On: 03/29/2022 17:57     Assessment & Plan:     1. Kidney stones Check KUB  - Urinalysis, Routine w reflex microscopic   2. left ureteral stone - check KUB and if visible schedule left ESWL. If not get a left renal US and consider left URS/HLL/stent/ Disc the nature r/b/a to these procedures.    No follow-ups on file.   Festus Aloe, MD   [facility]  [address] [place], [place] [address] [phone]

## 2022-05-02 NOTE — Progress Notes (Signed)
Chief Complaint:   OBESITY Heidi Payne is here to discuss her progress with her obesity treatment plan along with follow-up of her obesity related diagnoses. Heidi Payne is on the Category 3 Plan and states she is following her eating plan approximately 50% of the time. Heidi Payne states she is exercising 0 minutes 0 times per week.  Today's visit was #: 18 Starting weight: 215 lbs Starting date: 01/27/2021 Today's weight: 185 lbs Today's date: 04/19/2022 Total lbs lost to date: 30 lbs Total lbs lost since last in-office visit: 0  Interim History: Heidi Payne has had numerous cervical biopsies and an endometrial biopsy and also kidney stones since last appointment. Due to recurrent issues and pain she was only able to stick to plan 50% of time. Has small Christmas dinner with family and one with church in next few weeks. She is currently not in communication with her daughter and her grandson. Did not hear from daughters over the holiday and missed grandson's birthday.  Subjective:   1. Polyphagia On combo Phen/Topiramate recent significant medical issues due to kidney stones which is a common side effect of Topiramate.  2. Vitamin D deficiency Heidi Payne is currently taking prescription Vit D 50,000 IU once a week. Denies any nausea, vomiting or muscle weakness.  She notes fatigue.  3. At high risk for altered family dynamics Really significant communication issues and relationship strain with daughters. She is teary eyed.  Assessment/Plan:   1. Polyphagia We will refill Phentermine 15 mg daily for 1 month with 0 refills. Decrease Topamax to 50 mg for 5 days then take 25 mg for 5 days then off. - phentermine 15 MG capsule; Take 1 capsule (15 mg total) by mouth every morning.  Dispense: 30 capsule; Refill: 0  2. Vitamin D deficiency We will refill Vit D 50K IU once a week for 1 month with 0 refills.  -Refill Vitamin D, Ergocalciferol, (DRISDOL) 1.25 MG (50000 UNIT) CAPS capsule; Take 1  capsule (50,000 Units total) by mouth every 7 (seven) days.  Dispense: 4 capsule; Refill: 0  3. At high risk for altered family dynamics Discussed some support options for patient. She is to continue seeing therapist.  4. Obesity with current BMI of 29.9 Heidi Payne is currently in the action stage of change. As such, her goal is to continue with weight loss efforts. She has agreed to the Category 3 Plan.   Exercise goals: No exercise has been prescribed at this time.  Behavioral modification strategies: increasing lean protein intake, meal planning and cooking strategies, keeping healthy foods in the home, and planning for success.  Heidi Payne has agreed to follow-up with our clinic in 4 weeks. She was informed of the importance of frequent follow-up visits to maximize her success with intensive lifestyle modifications for her multiple health conditions.   Objective:   Blood pressure 125/85, pulse 95, temperature 98.8 F (37.1 C), height '5\' 6"'$  (1.676 m), weight 185 lb (83.9 kg), last menstrual period 12/12/2018, SpO2 99 %. Body mass index is 29.86 kg/m.  General: Cooperative, alert, well developed, in no acute distress. HEENT: Conjunctivae and lids unremarkable. Cardiovascular: Regular rhythm.  Lungs: Normal work of breathing. Neurologic: No focal deficits.   Lab Results  Component Value Date   CREATININE 1.27 (H) 03/29/2022   BUN 21 (H) 03/29/2022   NA 137 03/29/2022   K 3.4 (L) 03/29/2022   CL 108 03/29/2022   CO2 23 03/29/2022   Lab Results  Component Value Date   ALT 28 03/29/2022  AST 24 03/29/2022   ALKPHOS 93 03/29/2022   BILITOT 0.7 03/29/2022   Lab Results  Component Value Date   HGBA1C 5.7 02/07/2022   HGBA1C 6.1 (H) 01/27/2021   Lab Results  Component Value Date   INSULIN 26.6 (H) 01/27/2021   Lab Results  Component Value Date   TSH 1.01 02/07/2022   Lab Results  Component Value Date   CHOL 153 02/07/2022   HDL 56 02/07/2022   LDLCALC 75 02/07/2022    TRIG 122 02/07/2022   CHOLHDL 5.6 12/30/2015   Lab Results  Component Value Date   VD25OH 46.8 02/07/2022   VD25OH 40.5 09/05/2021   VD25OH 26.2 (L) 01/27/2021   Lab Results  Component Value Date   WBC 9.6 03/29/2022   HGB 13.2 03/29/2022   HCT 38.7 03/29/2022   MCV 86.2 03/29/2022   PLT 243 03/29/2022   No results found for: "IRON", "TIBC", "FERRITIN"  Attestation Statements:   Reviewed by clinician on day of visit: allergies, medications, problem list, medical history, surgical history, family history, social history, and previous encounter notes.  I, Elnora Morrison, RMA am acting as transcriptionist for Coralie Common, MD.  I have reviewed the above documentation for accuracy and completeness, and I agree with the above. - Coralie Common, MD

## 2022-05-02 NOTE — Progress Notes (Signed)
Talked with patient. Instructions given. Arrival time 0645 hx and meds reviewed. Driver secured,. NPO after MN

## 2022-05-04 ENCOUNTER — Ambulatory Visit (HOSPITAL_COMMUNITY): Payer: No Typology Code available for payment source

## 2022-05-04 ENCOUNTER — Other Ambulatory Visit: Payer: Self-pay

## 2022-05-04 ENCOUNTER — Ambulatory Visit (HOSPITAL_BASED_OUTPATIENT_CLINIC_OR_DEPARTMENT_OTHER)
Admission: RE | Admit: 2022-05-04 | Discharge: 2022-05-04 | Disposition: A | Discharge status: Home / Self Care | Payer: No Typology Code available for payment source | Attending: Urology | Admitting: Urology

## 2022-05-04 ENCOUNTER — Encounter (HOSPITAL_BASED_OUTPATIENT_CLINIC_OR_DEPARTMENT_OTHER)
Admission: RE | Disposition: A | Discharge status: Home / Self Care | Payer: Self-pay | Source: Home / Self Care | Attending: Urology

## 2022-05-04 ENCOUNTER — Encounter (HOSPITAL_BASED_OUTPATIENT_CLINIC_OR_DEPARTMENT_OTHER): Payer: Self-pay | Admitting: Urology

## 2022-05-04 DIAGNOSIS — I10 Essential (primary) hypertension: Secondary | ICD-10-CM | POA: Diagnosis not present

## 2022-05-04 DIAGNOSIS — N202 Calculus of kidney with calculus of ureter: Secondary | ICD-10-CM | POA: Diagnosis present

## 2022-05-04 DIAGNOSIS — F419 Anxiety disorder, unspecified: Secondary | ICD-10-CM | POA: Insufficient documentation

## 2022-05-04 DIAGNOSIS — J45909 Unspecified asthma, uncomplicated: Secondary | ICD-10-CM | POA: Diagnosis not present

## 2022-05-04 DIAGNOSIS — E669 Obesity, unspecified: Secondary | ICD-10-CM | POA: Diagnosis not present

## 2022-05-04 DIAGNOSIS — G4730 Sleep apnea, unspecified: Secondary | ICD-10-CM | POA: Diagnosis not present

## 2022-05-04 HISTORY — PX: EXTRACORPOREAL SHOCK WAVE LITHOTRIPSY: SHX1557

## 2022-05-04 SURGERY — LITHOTRIPSY, ESWL
Anesthesia: LOCAL | Laterality: Left

## 2022-05-04 MED ORDER — DIPHENHYDRAMINE HCL 25 MG PO CAPS
25.0000 mg | ORAL_CAPSULE | ORAL | Status: AC
  Administered 2022-05-04: 25 mg via ORAL

## 2022-05-04 MED ORDER — SODIUM CHLORIDE 0.9 % IV SOLN: INTRAVENOUS | Status: DC

## 2022-05-04 MED ORDER — DIPHENHYDRAMINE HCL 25 MG PO CAPS
ORAL_CAPSULE | ORAL | Status: AC
  Filled 2022-05-04: qty 1

## 2022-05-04 MED ORDER — CIPROFLOXACIN HCL 500 MG PO TABS
ORAL_TABLET | ORAL | Status: AC
  Filled 2022-05-04: qty 1

## 2022-05-04 MED ORDER — CIPROFLOXACIN HCL 500 MG PO TABS
500.0000 mg | ORAL_TABLET | ORAL | Status: AC
  Administered 2022-05-04: 500 mg via ORAL

## 2022-05-04 MED ORDER — DIAZEPAM 5 MG PO TABS
ORAL_TABLET | ORAL | Status: AC
  Filled 2022-05-04: qty 2

## 2022-05-04 MED ORDER — DIAZEPAM 5 MG PO TABS
10.0000 mg | ORAL_TABLET | ORAL | Status: AC
  Administered 2022-05-04: 10 mg via ORAL

## 2022-05-04 NOTE — Discharge Instructions (Addendum)
I have reviewed discharge instructions in detail with the patient. They will follow-up with me or their physician as scheduled. My nurse will also be calling the patients as per protocol.        Post Anesthesia Home Care Instructions  Activity: Get plenty of rest for the remainder of the day. A responsible individual must stay with you for 24 hours following the procedure.  For the next 24 hours, DO NOT: -Drive a car -Paediatric nurse -Drink alcoholic beverages -Take any medication unless instructed by your physician -Make any legal decisions or sign important papers.  Meals: Start with liquid foods such as gelatin or soup. Progress to regular foods as tolerated. Avoid greasy, spicy, heavy foods. If nausea and/or vomiting occur, drink only clear liquids until the nausea and/or vomiting subsides. Call your physician if vomiting continues.  Special Instructions/Symptoms: Your throat may feel dry or sore from the anesthesia or the breathing tube placed in your throat during surgery. If this causes discomfort, gargle with warm salt water. The discomfort should disappear within 24 hours.

## 2022-05-04 NOTE — Interval H&P Note (Signed)
History and Physical Interval Note:  05/04/2022 7:06 AM  Heidi Payne  has presented today for surgery, with the diagnosis of LEFT URETERAL STONE.  The various methods of treatment have been discussed with the patient and family. After consideration of risks, benefits and other options for treatment, the patient has consented to  Procedure(s): LEFT EXTRACORPOREAL SHOCK WAVE LITHOTRIPSY (ESWL) (Left) as a surgical intervention.  The patient's history has been reviewed, patient examined, no change in status, stable for surgery.  I have reviewed the patient's chart and labs.  Questions were answered to the patient's satisfaction.     Hassan Blackshire A Ahamed Hofland

## 2022-05-05 ENCOUNTER — Encounter (HOSPITAL_BASED_OUTPATIENT_CLINIC_OR_DEPARTMENT_OTHER): Payer: Self-pay | Admitting: Urology

## 2022-05-09 ENCOUNTER — Encounter: Payer: Self-pay | Admitting: Urology

## 2022-05-10 ENCOUNTER — Other Ambulatory Visit (INDEPENDENT_AMBULATORY_CARE_PROVIDER_SITE_OTHER): Payer: Self-pay | Admitting: Family Medicine

## 2022-05-10 DIAGNOSIS — R632 Polyphagia: Secondary | ICD-10-CM

## 2022-05-17 ENCOUNTER — Ambulatory Visit (INDEPENDENT_AMBULATORY_CARE_PROVIDER_SITE_OTHER): Payer: No Typology Code available for payment source | Admitting: Family Medicine

## 2022-05-17 ENCOUNTER — Encounter (INDEPENDENT_AMBULATORY_CARE_PROVIDER_SITE_OTHER): Payer: Self-pay | Admitting: Family Medicine

## 2022-05-17 VITALS — BP 115/80 | HR 95 | Temp 98.1°F | Ht 66.0 in | Wt 188.0 lb

## 2022-05-17 DIAGNOSIS — E559 Vitamin D deficiency, unspecified: Secondary | ICD-10-CM

## 2022-05-17 DIAGNOSIS — Z6830 Body mass index (BMI) 30.0-30.9, adult: Secondary | ICD-10-CM | POA: Diagnosis not present

## 2022-05-17 DIAGNOSIS — R632 Polyphagia: Secondary | ICD-10-CM | POA: Diagnosis not present

## 2022-05-17 DIAGNOSIS — E669 Obesity, unspecified: Secondary | ICD-10-CM | POA: Diagnosis not present

## 2022-05-17 MED ORDER — PHENTERMINE HCL 15 MG PO CAPS: 15.0000 mg | ORAL_CAPSULE | ORAL | 0 refills | Status: AC

## 2022-05-17 MED ORDER — VITAMIN D (ERGOCALCIFEROL) 1.25 MG (50000 UNIT) PO CAPS: 50000.0000 [IU] | ORAL_CAPSULE | ORAL | 0 refills | Status: DC

## 2022-05-18 ENCOUNTER — Encounter (INDEPENDENT_AMBULATORY_CARE_PROVIDER_SITE_OTHER): Payer: Self-pay | Admitting: Family Medicine

## 2022-05-21 ENCOUNTER — Other Ambulatory Visit: Payer: Self-pay | Admitting: Adult Health

## 2022-05-22 ENCOUNTER — Telehealth: Payer: Self-pay

## 2022-05-22 MED ORDER — TAMSULOSIN HCL 0.4 MG PO CAPS: 0.4000 mg | ORAL_CAPSULE | Freq: Every day | ORAL | 1 refills | Status: DC

## 2022-05-22 NOTE — Telephone Encounter (Signed)
Made patient aware that flomax was sent to her pharmacy. Patient voiced understanding

## 2022-05-29 ENCOUNTER — Ambulatory Visit (HOSPITAL_COMMUNITY)
Admission: RE | Admit: 2022-05-29 | Discharge: 2022-05-29 | Disposition: A | Discharge status: Home / Self Care | Payer: No Typology Code available for payment source | Source: Ambulatory Visit | Attending: Urology | Admitting: Urology

## 2022-05-29 ENCOUNTER — Ambulatory Visit (HOSPITAL_COMMUNITY)
Admission: RE | Admit: 2022-05-29 | Discharge: 2022-05-29 | Disposition: A | Discharge status: Home / Self Care | Payer: No Typology Code available for payment source | Source: Ambulatory Visit | Attending: Adult Health | Admitting: Adult Health

## 2022-05-29 ENCOUNTER — Ambulatory Visit (INDEPENDENT_AMBULATORY_CARE_PROVIDER_SITE_OTHER): Payer: No Typology Code available for payment source | Admitting: Urology

## 2022-05-29 ENCOUNTER — Encounter: Payer: Self-pay | Admitting: Urology

## 2022-05-29 VITALS — BP 141/87 | HR 97 | Ht 66.0 in | Wt 190.0 lb

## 2022-05-29 DIAGNOSIS — N200 Calculus of kidney: Secondary | ICD-10-CM

## 2022-05-29 DIAGNOSIS — Z1231 Encounter for screening mammogram for malignant neoplasm of breast: Secondary | ICD-10-CM | POA: Diagnosis present

## 2022-05-29 DIAGNOSIS — N201 Calculus of ureter: Secondary | ICD-10-CM

## 2022-05-29 LAB — URINALYSIS, ROUTINE W REFLEX MICROSCOPIC
Bilirubin, UA: NEGATIVE
Glucose, UA: NEGATIVE
Ketones, UA: NEGATIVE
Nitrite, UA: NEGATIVE
Protein,UA: NEGATIVE
Specific Gravity, UA: 1.015 (ref 1.005–1.030)
Urobilinogen, Ur: 0.2 mg/dL (ref 0.2–1.0)
pH, UA: 6 (ref 5.0–7.5)

## 2022-05-29 LAB — MICROSCOPIC EXAMINATION: Bacteria, UA: NONE SEEN

## 2022-05-29 NOTE — Progress Notes (Signed)
05/29/2022 9:45 AM   Heidi Payne 10/28/1965 [id number]  [place]erring [place] [address] [place],  [place] [address]  No chief complaint on file.   HPI:  F/u -   1) left ureteral stone - she had left flank pain and a 03/29/2022 CT revealed a 6 mm left proximal stone. It was difficult to see on the scout. Her WBC was 9.6, Cr 1.27. UA > 50 wbc, no rbc.    2) renal stones - bilateral L>R renal stones about 4 mm on CT 03/29/2022.    She had one other stone which she passed.    Today, seen for the above. She underwent left ESWL Dec 2023 for a left proximal stone - stone was difficult to see on KUB that day and targeted with IVP. She did not pass any fragments or sand, BUT an intact "stone" fell out of her pajama pants leg and she's not sure if it was her stone. Confusingly, a f/u KUB today 06/08/2022 shows a possible 5 mm fragment/stone at L4/L5. She has had some mild left flank discomfort. UA with 11-30 rbc.      She is a Chief Executive Officer for [employer].   PMH: Past Medical History:  Diagnosis Date   Anginal pain (HCC)    Anxiety    Asthma    Back pain    Bilateral swelling of feet    Depression    hitory of   Gallbladder problem    GERD (gastroesophageal reflux disease)    Headache    Hyperlipidemia    Hypertension    Joint pain    Kidney stones    LGSIL of cervix of undetermined significance 03/20/2022   03/20/22 will get colpo   Mental disorder    depression.   Seasonal allergies    Sleep apnea    SOB (shortness of breath)    Vitamin D deficiency    Vitiligo     Surgical History: Past Surgical History:  Procedure Laterality Date   [facility] N/A 12/31/2015   Procedure: Left Heart Cath and Coronary Angiography;  Surgeon: Jettie Booze, MD;  Location: [facility];  Service: Cardiovascular;  Laterality: N/A;   CESAREAN SECTION W/BTL  05/16/2003   CHOLECYSTECTOMY  11/27/2011   Procedure:  LAPAROSCOPIC CHOLECYSTECTOMY;  Surgeon: Jamesetta So, MD;  Location: [facility];  Service: General;  Laterality: N/A;   COLONOSCOPY  03/19/2012   Procedure: COLONOSCOPY;  Surgeon: Jamesetta So, MD;  Location: [facility];  Service: Gastroenterology;  Laterality: N/A;   COLONOSCOPY N/A 10/26/2020   Procedure: COLONOSCOPY;  Surgeon: Aviva Signs, MD;  Location: [facility];  Service: Gastroenterology;  Laterality: N/A;   COLPOSCOPY  01/11/2015   DILATION AND CURETTAGE OF UTERUS     ENDOMETRIAL BIOPSY  01/14/2019   EXTRACORPOREAL SHOCK WAVE LITHOTRIPSY Left 05/04/2022   Procedure: LEFT EXTRACORPOREAL SHOCK WAVE LITHOTRIPSY (ESWL);  Surgeon: Bjorn Loser, MD;  [facility]: [facility];  Service: Urology;  Laterality: Left;   LAPAROSCOPIC SALPINGO OOPHERECTOMY Bilateral 01/22/2019   Procedure: LAPAROSCOPIC SALPINGO OOPHORECTOMY;  Surgeon: Florian Buff, MD;  Location: [facility];  Service: Gynecology;  Laterality: Bilateral;   TUBAL LIGATION      Home Medications:  Allergies as of 05/29/2022   No Known Allergies      Medication List        Accurate as of May 29, 2022  9:45 AM.  If you have any questions, ask your nurse or doctor.          acetaminophen 500 MG tablet Commonly known as: TYLENOL Take 500 mg by mouth every 6 (six) hours as needed.   albuterol 108 (90 Base) MCG/ACT inhaler Commonly known as: VENTOLIN HFA Inhale 2 puffs into the lungs every 6 (six) hours as needed for wheezing or shortness of breath.   amLODipine 2.5 MG tablet Commonly known as: NORVASC Take 2.5 mg by mouth daily.   atorvastatin 40 MG tablet Commonly known as: LIPITOR Take 40 mg by mouth at bedtime.   buPROPion 150 MG 24 hr tablet Commonly known as: WELLBUTRIN XL Take 150 mg by mouth daily.   clobetasol cream 0.05 % Commonly known as: TEMOVATE Apply 1 Application topically 2 (two) times daily.   DULoxetine 60 MG capsule Commonly known as: CYMBALTA Take 60 mg by mouth  2 (two) times daily.   estradiol 0.1 MG/GM vaginal cream Commonly known as: ESTRACE VAGINAL Use 0.5 gm 2 x weekly in vagina   Fluticasone Propionate (Inhal) 55 MCG/ACT Aepb Inhale into the lungs.   ibuprofen 200 MG tablet Commonly known as: ADVIL Take 200 mg by mouth every 6 (six) hours as needed.   loratadine 10 MG tablet Commonly known as: CLARITIN Take 10 mg by mouth daily.   losartan 25 MG tablet Commonly known as: COZAAR Take 25 mg by mouth daily.   montelukast 10 MG tablet Commonly known as: SINGULAIR Take 10 mg by mouth at bedtime.   multivitamin with minerals tablet Take 2 tablets by mouth daily.   omeprazole 40 MG capsule Commonly known as: PRILOSEC Take 40 mg by mouth daily.   ondansetron 4 MG tablet Commonly known as: ZOFRAN Take 1 tablet (4 mg total) by mouth every 6 (six) hours.   oxyCODONE-acetaminophen 5-325 MG tablet Commonly known as: PERCOCET/ROXICET Take 1 tablet by mouth every 4 (four) hours as needed.   phentermine 15 MG capsule Take 1 capsule (15 mg total) by mouth every morning.   pramoxine-hydrocortisone 1-1 % rectal cream Commonly known as: PROCTOCREAM-HC PLACE 1 APPLICATION RECTALLY 2 (TWO) TIMES DAILY.   tamsulosin 0.4 MG Caps capsule Commonly known as: FLOMAX Take 1 capsule (0.4 mg total) by mouth daily after supper.   tamsulosin 0.4 MG Caps capsule Commonly known as: FLOMAX Take 1 capsule (0.4 mg total) by mouth daily.   Vitamin D (Ergocalciferol) 1.25 MG (50000 UNIT) Caps capsule Commonly known as: DRISDOL Take 1 capsule (50,000 Units total) by mouth every 7 (seven) days.        Allergies: No Known Allergies  Family History: Family History  Problem Relation Age of Onset   Cancer Mother        bone brain cervical   Hyperlipidemia Father    Cancer Father        colon   Diabetes Father    Hypertension Father    Cancer Maternal Aunt        breast   Cancer Maternal Aunt        breast   Cancer Maternal Aunt         breast   Cancer Paternal Aunt        esophageal, lung    Social History:  reports that she has never smoked. She has never used smokeless tobacco. She reports that she does not currently use alcohol. She reports that she does not use drugs.   Physical Exam: BP (!) 141/87   Pulse 97  Ht '5\' 6"'$  (1.676 m)   Wt 190 lb (86.2 kg)   LMP 12/12/2018   BMI 30.67 kg/m   Constitutional:  Alert and oriented, No acute distress. HEENT: [place] AT, moist mucus membranes.  Trachea midline, no masses. Cardiovascular: No clubbing, cyanosis, or edema. Respiratory: Normal respiratory effort, no increased work of breathing. GI: Abdomen is soft, nontender, nondistended, no abdominal masses GU: No CVA tenderness Skin: No rashes, bruises or suspicious lesions. Neurologic: Grossly intact, no focal deficits, moving all 4 extremities. Psychiatric: Normal mood and affect.  Laboratory Data: Lab Results  Component Value Date   WBC 9.6 03/29/2022   HGB 13.2 03/29/2022   HCT 38.7 03/29/2022   MCV 86.2 03/29/2022   PLT 243 03/29/2022    Lab Results  Component Value Date   CREATININE 1.27 (H) 03/29/2022    No results found for: "PSA"  No results found for: "TESTOSTERONE"  Lab Results  Component Value Date   HGBA1C 5.7 02/07/2022    Urinalysis    Component Value Date/Time   COLORURINE YELLOW 03/29/2022 1515   APPEARANCEUR Clear 04/17/2022 1441   LABSPEC 1.023 03/29/2022 1515   PHURINE 6.0 03/29/2022 1515   GLUCOSEU Negative 04/17/2022 1441   HGBUR NEGATIVE 03/29/2022 1515   BILIRUBINUR Negative 04/17/2022 1441   KETONESUR NEGATIVE 03/29/2022 1515   PROTEINUR Negative 04/17/2022 1441   PROTEINUR 30 (A) 03/29/2022 1515   NITRITE Negative 04/17/2022 1441   NITRITE NEGATIVE 03/29/2022 1515   LEUKOCYTESUR Trace (A) 04/17/2022 1441   LEUKOCYTESUR LARGE (A) 03/29/2022 1515    Lab Results  Component Value Date   LABMICR See below: 04/17/2022   WBCUA 0-5 04/17/2022   LABEPIT 0-10 04/17/2022    BACTERIA None seen 04/17/2022    Pertinent Imaging: KUB x 3 and CT scan images reviewed -- \ Results for orders placed during the hospital encounter of 05/29/22  DG Abd 1 View  Narrative CLINICAL DATA:  Status post extracorporeal shock wave lithotripsy  EXAM: ABDOMEN - 1 VIEW  COMPARISON:  Abdominal radiograph dated 05/04/2022  FINDINGS: Nonobstructive bowel gas pattern. No free air or pneumatosis. Moderate volume stool throughout the colon. Multifocal radiodensities project over the left kidney in keeping with nephrolithiasis. Ovoid radiodensity projecting over the medial left lower quadrant at the level of L4-5 measures 5 mm and may reflect a mid left ureteral stone. No acute or substantial osseous abnormality. The sacrum and coccyx are partially obscured by overlying bowel contents. Right upper quadrant surgical clips.  IMPRESSION: 1. Multifocal radiodensities project over the left kidney in keeping with nephrolithiasis. 2. Ovoid radiodensity projecting over the medial left lower quadrant at the level of L4-5 measures 5 mm and may reflect a mid left ureteral stone.   Electronically Signed By: Darrin Nipper M.D. On: 05/29/2022 09:40  No results found for this or any previous visit.  No results found for this or any previous visit.  No results found for this or any previous visit.  No results found for this or any previous visit.  No valid procedures specified. No results found for this or any previous visit.  Results for orders placed during the hospital encounter of 03/29/22  CT Renal Stone Study  Narrative CLINICAL DATA:  Abdominal pain, left flank pain  EXAM: CT ABDOMEN AND PELVIS WITHOUT CONTRAST  TECHNIQUE: Multidetector CT imaging of the abdomen and pelvis was performed following the standard protocol without IV contrast.  RADIATION DOSE REDUCTION: This exam was performed according to the departmental dose-optimization program which includes  automated exposure control, adjustment of the mA and/or kV according to patient size and/or use of iterative reconstruction technique.  COMPARISON:  None Available.  FINDINGS: Lower chest: Small linear densities are seen in left lower lobe suggesting scarring or minimal subsegmental atelectasis.  Hepatobiliary: There are multiple low-density lesions of varying sizes largest measuring 4.9 cm. There is fluid attenuation in these lesions suggesting possible hepatic cysts. There is no dilation of bile ducts. Surgical clips are seen in gallbladder fossa.  Pancreas: No focal abnormalities are seen.  Spleen: Unremarkable.  Adrenals/Urinary Tract: Adrenals are unremarkable. There is mild left hydronephrosis. There is 8 mm calculus in the proximal left ureter at L3-L4 level. There are multiple bilateral renal stones, more so in the left kidney. There is mild left perinephric stranding. Distal left ureter is not dilated. Urinary bladder is unremarkable.  Stomach/Bowel: Stomach is unremarkable. Small bowel loops are not dilated. Appendix is not seen. There is no significant wall thickening in colon. Moderate to large amount of stool is seen in proximal colon. There is no fecal impaction in rectum. Few diverticula are seen in colon without signs of focal acute diverticulitis.  Vascular/Lymphatic: Scattered arterial calcifications are seen.  Reproductive: Uterus is retroverted.  There are no adnexal masses.  Other: There is no ascites or pneumoperitoneum. Umbilical hernia containing fat is seen. Bilateral inguinal hernias containing fat are seen, larger on the left side.  Musculoskeletal: Degenerative changes are noted in lumbar spine, more so at the L2-L3 level.  IMPRESSION: There is 8 mm calculus in the proximal left ureter close to the ureteropelvic junction causing mild left hydronephrosis. Bilateral renal stones, more so in the left kidney.  There is no evidence of intestinal  obstruction or pneumoperitoneum.  Multiple hepatic cysts. Few diverticula are seen in colon without signs of focal acute diverticulitis. Moderate to large amount of stool is seen in proximal colon.  Other findings as described in the body of the report.   Electronically Signed By: Elmer Picker M.D. On: 03/29/2022 17:57   Assessment & Plan:    1. Kidney stones Monitor the renal stones  - Urinalysis, Routine w reflex microscopic  2. Left ureteral stone - a possible stone fell out of her PJ pants leg but possible residual stone on KUB. Check renal US and consider left URS/HLL/stent - again disc poss staged procedure.  No follow-ups on file.  Festus Aloe, MD  [facility]  [address] [place], [place] [address] [phone]

## 2022-05-29 NOTE — Progress Notes (Signed)
Chief Complaint:   OBESITY Heidi Payne is here to discuss her progress with her obesity treatment plan along with follow-up of her obesity related diagnoses. [name] is on the Category 3 Plan and states she is following her eating plan approximately 60% of the time. [name] states she is using the treadmill 20 minutes 2 times per week.  Today's visit was #: 44 Starting weight: 215 lbs Starting date: 01/27/2021 Today's weight: 188 lbs Today's date: 05/17/2022 Total lbs lost to date: 27 lbs Total lbs lost since last in-office visit: 0  Interim History: Heidi Payne got to see her younger daughters the week going into Christmas and that made her very happy.  She unfortunately had kidney stones on Christmas morning and was on the couch taking pain medication.  Does not necessarily want to change meal plan.  Subjective:   1. Polyphagia Heidi Payne is on Phentermine 45 mg daily now.  No side effects noted.  Blood pressure and pulse within normal limits.  PDMP ( prescription drug monitoring program) checked.  Stopped topiramate due to nephrolithiasis.  2. Vitamin D deficiency Heidi Payne is currently taking prescription Vit D 50,000 IU once a week.   Last vitamin D below goal.  Assessment/Plan:   1. Polyphagia We we will refill Phentermine 15 mg daily for 1 month with 0 refills.  -Refill phentermine 15 MG capsule; Take 1 capsule (15 mg total) by mouth every morning.  Dispense: 30 capsule; Refill: 0  2. Vitamin D deficiency We will refill Vit D 50K IU once a week for 1 month with 0 refills.  -Refill Vitamin D, Ergocalciferol, (DRISDOL) 1.25 MG (50000 UNIT) CAPS capsule; Take 1 capsule (50,000 Units total) by mouth every 7 (seven) days.  Dispense: 4 capsule; Refill: 0  3. Obesity with current BMI of 30.4 Heidi Payne is currently in the action stage of change. As such, her goal is to continue with weight loss efforts. She has agreed to the Category 3 Plan.   Exercise goals: All adults should  avoid inactivity. Some physical activity is better than none, and adults who participate in any amount of physical activity gain some health benefits.  Behavioral modification strategies: increasing lean protein intake, meal planning and cooking strategies, keeping healthy foods in the home, and planning for success.  Heidi Payne has agreed to follow-up with our clinic in 4 weeks. She was informed of the importance of frequent follow-up visits to maximize her success with intensive lifestyle modifications for her multiple health conditions.   Objective:   Blood pressure 115/80, pulse 95, temperature 98.1 F (36.7 C), height '5\' 6"'$  (1.676 m), weight 188 lb (85.3 kg), last menstrual period 12/12/2018, SpO2 99 %. Body mass index is 30.34 kg/m.  General: Cooperative, alert, well developed, in no acute distress. HEENT: Conjunctivae and lids unremarkable. Cardiovascular: Regular rhythm.  Lungs: Normal work of breathing. Neurologic: No focal deficits.   Lab Results  Component Value Date   CREATININE 1.27 (H) 03/29/2022   BUN 21 (H) 03/29/2022   NA 137 03/29/2022   K 3.4 (L) 03/29/2022   CL 108 03/29/2022   CO2 23 03/29/2022   Lab Results  Component Value Date   ALT 28 03/29/2022   AST 24 03/29/2022   ALKPHOS 93 03/29/2022   BILITOT 0.7 03/29/2022   Lab Results  Component Value Date   HGBA1C 5.7 02/07/2022   HGBA1C 6.1 (H) 01/27/2021   Lab Results  Component Value Date   INSULIN 26.6 (H) 01/27/2021   Lab Results  Component  Value Date   TSH 1.01 02/07/2022   Lab Results  Component Value Date   CHOL 153 02/07/2022   HDL 56 02/07/2022   LDLCALC 75 02/07/2022   TRIG 122 02/07/2022   CHOLHDL 5.6 12/30/2015   Lab Results  Component Value Date   VD25OH 46.8 02/07/2022   VD25OH 40.5 09/05/2021   VD25OH 26.2 (L) 01/27/2021   Lab Results  Component Value Date   WBC 9.6 03/29/2022   HGB 13.2 03/29/2022   HCT 38.7 03/29/2022   MCV 86.2 03/29/2022   PLT 243 03/29/2022    No results found for: "IRON", "TIBC", "FERRITIN"  Attestation Statements:   Reviewed by clinician on day of visit: allergies, medications, problem list, medical history, surgical history, family history, social history, and previous encounter notes.  I, Elnora Morrison, RMA am acting as transcriptionist for Coralie Common, MD.  I have reviewed the above documentation for accuracy and completeness, and I agree with the above. - Coralie Common, MD

## 2022-06-02 ENCOUNTER — Ambulatory Visit (HOSPITAL_COMMUNITY)
Admission: RE | Admit: 2022-06-02 | Discharge: 2022-06-02 | Disposition: A | Discharge status: Home / Self Care | Payer: No Typology Code available for payment source | Source: Ambulatory Visit | Attending: Urology | Admitting: Urology

## 2022-06-02 DIAGNOSIS — N201 Calculus of ureter: Secondary | ICD-10-CM | POA: Insufficient documentation

## 2022-06-02 DIAGNOSIS — N200 Calculus of kidney: Secondary | ICD-10-CM | POA: Diagnosis present

## 2022-06-08 ENCOUNTER — Telehealth: Payer: Self-pay

## 2022-06-08 NOTE — Telephone Encounter (Signed)
Made patient aware that there is still some swelling of the left kidney and this could mean that she is probably still passing her stone or some stone fragments. Patient is aware that  Dr. Junious Silk will be setting her up for ureteroscopy and Pam from [place] will call her to get this scheduled. Patient is aware that if she pass a stone before the procedures to call the office and let us know. Patient voiced that she is having frequent urination along with back pain, and she has not passed any stones. Made patient aware that I will send a task to Dr. Junious Silk making him aware. Patient voiced understanding

## 2022-06-08 NOTE — Telephone Encounter (Signed)
-----  Message from Festus Aloe, MD sent at 06/08/2022  2:10 PM EST ----- Please let Patty know there is still some swelling or hydronephrosis of the left kidney meaning she is probably still passing her stone or stone fragments. I am going to set her up for left ureteroscopy. Pam from [facility] will call to schedule. If she passed a stone before the procedure she should call and let us know.   ----- Message ----- From: Sherrilyn Rist, CMA Sent: 06/05/2022   8:22 AM EST To: Festus Aloe, MD  Please review

## 2022-06-09 ENCOUNTER — Other Ambulatory Visit: Payer: Self-pay | Admitting: Urology

## 2022-06-10 ENCOUNTER — Other Ambulatory Visit: Payer: Self-pay | Admitting: Adult Health

## 2022-06-11 LAB — CALCULI, WITH PHOTOGRAPH (CLINICAL LAB): Weight Calculi: 63 mg

## 2022-06-13 ENCOUNTER — Telehealth: Payer: Self-pay

## 2022-06-13 ENCOUNTER — Other Ambulatory Visit: Payer: Self-pay

## 2022-06-13 ENCOUNTER — Encounter (HOSPITAL_COMMUNITY): Payer: Self-pay

## 2022-06-13 DIAGNOSIS — N200 Calculus of kidney: Secondary | ICD-10-CM

## 2022-06-13 NOTE — Patient Instructions (Signed)
SURGICAL WAITING ROOM VISITATION  Patients having surgery or a procedure may have no more than 2 support people in the waiting area - these visitors may rotate.    Children under the age of 104 must have an adult with them who is not the patient.  Due to an increase in RSV and influenza rates and associated hospitalizations, children ages 63 and under may not visit patients in [place].  If the patient needs to stay at the hospital during part of their recovery, the visitor guidelines for inpatient rooms apply. Pre-op nurse will coordinate an appropriate time for 1 support person to accompany patient in pre-op.  This support person may not rotate.    Please refer to the [facility] website for the visitor guidelines for Inpatients (after your surgery is over and you are in a regular room).       Your procedure is scheduled on:    Report to [facility] Main Entrance    Report to admitting at AM   Call this number if you have problems the morning of surgery [phone]   Do not eat food or drink liquids :After Midnight.                               If you have questions, please contact your surgeon's office.   FOLLOW  ANY ADDITIONAL PRE OP INSTRUCTIONS YOU RECEIVED FROM YOUR SURGEON'S OFFICE!!!     Oral Hygiene is also important to reduce your risk of infection.                                    Remember - BRUSH YOUR TEETH THE MORNING OF SURGERY WITH YOUR REGULAR TOOTHPASTE  DENTURES WILL BE REMOVED PRIOR TO SURGERY PLEASE DO NOT APPLY "Poly grip" OR ADHESIVES!!!   Do NOT smoke after Midnight   Take these medicines the morning of surgery with A SIP OF WATER: amlodipine, bupropion, duloxentine, loratadine, omeprazole, tamsulosin , inhaler, and bring rescue inhaler with you    Bring CPAP mask and tubing day of surgery.                              You may not have any metal on your body including hair pins, jewelry, and body piercing              Do not wear make-up, lotions, powders, perfumes/cologne, or deodorant  Do not wear nail polish including gel and S&S, artificial/acrylic nails, or any other type of covering on natural nails including finger and toenails. If you have artificial nails, gel coating, etc. that needs to be removed by a nail salon please have this removed prior to surgery or surgery may need to be canceled/ delayed if the surgeon/ anesthesia feels like they are unable to be safely monitored.   Do not shave  48 hours prior to surgery.               Do not bring valuables to the hospital. [employer].   Contacts, glasses, dentures or bridgework may not be worn into surgery.      DO NOT [place]. PHARMACY WILL DISPENSE MEDICATIONS  LISTED ON YOUR MEDICATION LIST TO YOU DURING [place]!    Patients discharged on the day of surgery will not be allowed to drive home.  Someone NEEDS to stay with you for the first 24 hours after anesthesia.   Special Instructions: Bring a copy of your healthcare power of attorney and living will documents the day of surgery if you haven't scanned them before.              Please read over the following fact sheets you were given: IF [place] [phone]   If you received a COVID test during your pre-op visit  it is requested that you wear a mask when out in public, stay away from anyone that may not be feeling well and notify your surgeon if you develop symptoms. If you test positive for Covid or have been in contact with anyone that has tested positive in the last 10 days please notify you surgeon.    [place] - Preparing for Surgery Before surgery, you can play an important role.  Because skin is not sterile, your skin needs to be as free of germs as possible.  You can reduce the number of germs on your skin by washing with  CHG (chlorahexidine gluconate) soap before surgery.  CHG is an antiseptic cleaner which kills germs and bonds with the skin to continue killing germs even after washing. Please DO NOT use if you have an allergy to CHG or antibacterial soaps.  If your skin becomes reddened/irritated stop using the CHG and inform your nurse when you arrive at Short Stay. Do not shave (including legs and underarms) for at least 48 hours prior to the first CHG shower.  You may shave your face/neck. Please follow these instructions carefully:  1.  Shower with CHG Soap the night before surgery and the  morning of Surgery.  2.  If you choose to wash your hair, wash your hair first as usual with your  normal  shampoo.  3.  After you shampoo, rinse your hair and body thoroughly to remove the  shampoo.                           4.  Use CHG as you would any other liquid soap.  You can apply chg directly  to the skin and wash                       Gently with a scrungie or clean washcloth.  5.  Apply the CHG Soap to your body ONLY FROM THE NECK DOWN.   Do not use on face/ open                           Wound or open sores. Avoid contact with eyes, ears mouth and genitals (private parts).                       Wash face,  Genitals (private parts) with your normal soap.             6.  Wash thoroughly, paying special attention to the area where your surgery  will be performed.  7.  Thoroughly rinse your body with warm water from the neck down.  8.  DO NOT shower/wash with your normal soap after using and rinsing off  the CHG Soap.                9.  Pat yourself dry with a clean towel.            10.  Wear clean pajamas.            11.  Place clean sheets on your bed the night of your first shower and do not  sleep with pets. Day of Surgery : Do not apply any lotions/deodorants the morning of surgery.  Please wear clean clothes to the hospital/surgery center.  FAILURE TO FOLLOW THESE INSTRUCTIONS MAY RESULT IN THE CANCELLATION  OF YOUR SURGERY PATIENT SIGNATURE_________________________________  NURSE SIGNATURE__________________________________  ________________________________________________________________________

## 2022-06-13 NOTE — Telephone Encounter (Signed)
Orders for [place] placed.

## 2022-06-13 NOTE — Telephone Encounter (Signed)
Patient is needing a renal US prior to her post op in April. Please place order and I will contact patient in regard to app date and time.      Thank you!

## 2022-06-14 ENCOUNTER — Encounter (HOSPITAL_COMMUNITY)
Admission: RE | Admit: 2022-06-14 | Discharge: 2022-06-14 | Disposition: A | Discharge status: Home / Self Care | Payer: No Typology Code available for payment source | Source: Ambulatory Visit | Attending: Urology | Admitting: Urology

## 2022-06-14 ENCOUNTER — Other Ambulatory Visit: Payer: Self-pay

## 2022-06-14 ENCOUNTER — Ambulatory Visit (INDEPENDENT_AMBULATORY_CARE_PROVIDER_SITE_OTHER): Payer: No Typology Code available for payment source | Admitting: Family Medicine

## 2022-06-14 ENCOUNTER — Encounter (HOSPITAL_COMMUNITY): Payer: Self-pay

## 2022-06-14 VITALS — BP 144/95 | HR 103 | Temp 98.3°F | Ht 66.0 in | Wt 196.0 lb

## 2022-06-14 DIAGNOSIS — I10 Essential (primary) hypertension: Secondary | ICD-10-CM | POA: Insufficient documentation

## 2022-06-14 DIAGNOSIS — R7303 Prediabetes: Secondary | ICD-10-CM | POA: Diagnosis not present

## 2022-06-14 DIAGNOSIS — Z01812 Encounter for preprocedural laboratory examination: Secondary | ICD-10-CM | POA: Insufficient documentation

## 2022-06-14 HISTORY — DX: Personal history of urinary calculi: Z87.442

## 2022-06-14 HISTORY — DX: Prediabetes: R73.03

## 2022-06-14 LAB — BASIC METABOLIC PANEL
Anion gap: 9 (ref 5–15)
BUN: 22 mg/dL — ABNORMAL HIGH (ref 6–20)
CO2: 26 mmol/L (ref 22–32)
Calcium: 9.2 mg/dL (ref 8.9–10.3)
Chloride: 105 mmol/L (ref 98–111)
Creatinine, Ser: 0.99 mg/dL (ref 0.44–1.00)
GFR, Estimated: >60 mL/min (ref >60)
Glucose, Bld: 96 mg/dL (ref 70–99)
Potassium: 3.8 mmol/L (ref 3.5–5.1)
Sodium: 140 mmol/L (ref 135–145)

## 2022-06-14 LAB — CBC
HCT: 42.3 % (ref 36.0–46.0)
Hemoglobin: 13.9 g/dL (ref 12.0–15.0)
MCH: 28.4 pg (ref 26.0–34.0)
MCHC: 32.9 g/dL (ref 30.0–36.0)
MCV: 86.3 fL (ref 80.0–100.0)
Platelets: 242 10*3/uL (ref 150–400)
RBC: 4.9 MIL/uL (ref 3.87–5.11)
RDW: 12.4 % (ref 11.5–15.5)
WBC: 4.7 10*3/uL (ref 4.0–10.5)
nRBC: 0 % (ref 0.0–0.2)

## 2022-06-14 LAB — HEMOGLOBIN A1C
Hgb A1c MFr Bld: 5.3 % (ref 4.8–5.6)
Mean Plasma Glucose: 105.41 mg/dL

## 2022-06-14 LAB — GLUCOSE, CAPILLARY: Glucose-Capillary: 104 mg/dL — ABNORMAL HIGH (ref 70–99)

## 2022-06-14 NOTE — Telephone Encounter (Signed)
-----  Message from Festus Aloe, MD sent at 06/14/2022  9:14 AM EST ----- Let Heidi Payne know the stone she brought in for analysis was NOT a kidney stone. We need to keep plan for a look into her bladder and ureter for 06/27/2022 with me in [place].   ----- Message ----- From: Audie Box, CMA Sent: 06/13/2022   8:48 AM EST To: Festus Aloe, MD  Please review.

## 2022-06-14 NOTE — Telephone Encounter (Signed)
Patient aware of MD response and will f/u as scheduled on 02/13.

## 2022-06-14 NOTE — Progress Notes (Addendum)
For Short Stay: [place] appointment date:  Bowel Prep reminder:   For Anesthesia: PCP - Dr. Gar Ponto Cardiologist - Assar Jodelle Gross. LOV: 09/27/21  Chest x-ray -  EKG - 09/27/21: Chart Stress Test -  ECHO - 12/31/15 Cardiac Cath - 12/31/15 Pacemaker/ICD device last checked: Pacemaker orders received: Device Rep notified:  Spinal Cord Stimulator:  Sleep Study - Yes CPAP - Yes  Fasting Blood Sugar - N/A Checks Blood Sugar _____ times a day Date and result of last Hgb A1c-  Last dose of GLP1 agonist-  GLP1 instructions:   Last dose of SGLT-2 inhibitors-  SGLT-2 instructions:   Blood Thinner Instructions: Aspirin Instructions: Last Dose:  Activity level: Can go up a flight of stairs and activities of daily living without stopping and without chest pain and/or shortness of breath   Able to exercise without chest pain and/or shortness of breath  Anesthesia review: Hx: HTN,Angina,Pre-DIA.,OSA(CPAP)   Patient denies shortness of breath, fever, cough and chest pain at PAT appointment   Patient verbalized understanding of instructions that were given to them at the PAT appointment. Patient was also instructed that they will need to review over the PAT instructions again at home before surgery.

## 2022-06-19 ENCOUNTER — Other Ambulatory Visit: Payer: Self-pay

## 2022-06-19 MED ORDER — TAMSULOSIN HCL 0.4 MG PO CAPS: 0.4000 mg | ORAL_CAPSULE | Freq: Every day | ORAL | 0 refills | Status: AC

## 2022-06-19 NOTE — Progress Notes (Signed)
Pharmacy requested refill of Tamsulosin rx qty 90days.  Verbal from Dr. Junious Silk ok to refill qty 30 with no refills. Refill sent to pharmacy.

## 2022-06-26 ENCOUNTER — Encounter (HOSPITAL_COMMUNITY): Payer: Self-pay | Admitting: Urology

## 2022-06-26 NOTE — Anesthesia Preprocedure Evaluation (Signed)
Anesthesia Evaluation  Patient identified by MRN, date of birth, ID band Patient awake    Reviewed: Allergy & Precautions, NPO status , Patient's Chart, lab work & pertinent test results  History of Anesthesia Complications Negative for: history of anesthetic complications  Airway Mallampati: II  TM Distance: >3 FB Neck ROM: Full    Dental no notable dental hx.    Pulmonary asthma , sleep apnea    Pulmonary exam normal        Cardiovascular hypertension, Pt. on medications Normal cardiovascular exam     Neuro/Psych  Headaches  Anxiety Depression       GI/Hepatic ,GERD  Medicated,,  Endo/Other    Renal/GU LEFT URETERAL STONE     Musculoskeletal   Abdominal   Peds  Hematology   Anesthesia Other Findings   Reproductive/Obstetrics                              Anesthesia Physical Anesthesia Plan  ASA: 2  Anesthesia Plan: General   Post-op Pain Management: Tylenol PO (pre-op)*   Induction: Intravenous  PONV Risk Score and Plan: 3 and Midazolam, Treatment may vary due to age or medical condition, Dexamethasone, Ondansetron and Scopolamine patch - Pre-op  Airway Management Planned: LMA  Additional Equipment: None  Intra-op Plan:   Post-operative Plan: Extubation in OR  Informed Consent: I have reviewed the patients History and Physical, chart, labs and discussed the procedure including the risks, benefits and alternatives for the proposed anesthesia with the patient or authorized representative who has indicated his/her understanding and acceptance.     Dental advisory given  Plan Discussed with: CRNA  Anesthesia Plan Comments:         Anesthesia Quick Evaluation

## 2022-06-27 ENCOUNTER — Ambulatory Visit (HOSPITAL_COMMUNITY): Payer: No Typology Code available for payment source

## 2022-06-27 ENCOUNTER — Ambulatory Visit (HOSPITAL_COMMUNITY)
Admission: RE | Admit: 2022-06-27 | Discharge: 2022-06-27 | Disposition: A | Discharge status: Home / Self Care | Payer: No Typology Code available for payment source | Source: Ambulatory Visit | Attending: Urology | Admitting: Urology

## 2022-06-27 ENCOUNTER — Other Ambulatory Visit: Payer: Self-pay

## 2022-06-27 ENCOUNTER — Encounter (HOSPITAL_COMMUNITY): Payer: Self-pay | Admitting: Urology

## 2022-06-27 ENCOUNTER — Ambulatory Visit (HOSPITAL_COMMUNITY): Payer: No Typology Code available for payment source | Admitting: Physician Assistant

## 2022-06-27 ENCOUNTER — Ambulatory Visit (HOSPITAL_BASED_OUTPATIENT_CLINIC_OR_DEPARTMENT_OTHER): Payer: No Typology Code available for payment source | Admitting: Certified Registered Nurse Anesthetist

## 2022-06-27 ENCOUNTER — Encounter (HOSPITAL_COMMUNITY)
Admission: RE | Disposition: A | Discharge status: Home / Self Care | Payer: Self-pay | Source: Ambulatory Visit | Attending: Urology

## 2022-06-27 DIAGNOSIS — N132 Hydronephrosis with renal and ureteral calculous obstruction: Secondary | ICD-10-CM | POA: Diagnosis not present

## 2022-06-27 DIAGNOSIS — I10 Essential (primary) hypertension: Secondary | ICD-10-CM | POA: Diagnosis not present

## 2022-06-27 DIAGNOSIS — F419 Anxiety disorder, unspecified: Secondary | ICD-10-CM | POA: Insufficient documentation

## 2022-06-27 DIAGNOSIS — J45909 Unspecified asthma, uncomplicated: Secondary | ICD-10-CM | POA: Insufficient documentation

## 2022-06-27 DIAGNOSIS — F32A Depression, unspecified: Secondary | ICD-10-CM | POA: Insufficient documentation

## 2022-06-27 DIAGNOSIS — K219 Gastro-esophageal reflux disease without esophagitis: Secondary | ICD-10-CM | POA: Insufficient documentation

## 2022-06-27 DIAGNOSIS — G4730 Sleep apnea, unspecified: Secondary | ICD-10-CM | POA: Diagnosis not present

## 2022-06-27 DIAGNOSIS — N201 Calculus of ureter: Secondary | ICD-10-CM

## 2022-06-27 HISTORY — PX: CYSTOSCOPY WITH RETROGRADE PYELOGRAM, URETEROSCOPY AND STENT PLACEMENT: SHX5789

## 2022-06-27 SURGERY — CYSTOURETEROSCOPY, WITH RETROGRADE PYELOGRAM AND STENT INSERTION
Anesthesia: General | Laterality: Left

## 2022-06-27 MED ORDER — LIDOCAINE 2% (20 MG/ML) 5 ML SYRINGE
INTRAMUSCULAR | Status: DC | PRN
  Administered 2022-06-27: 100 mg via INTRAVENOUS

## 2022-06-27 MED ORDER — SODIUM CHLORIDE 0.9 % IR SOLN
Status: DC | PRN
  Administered 2022-06-27: 3000 mL via INTRAVESICAL

## 2022-06-27 MED ORDER — FENTANYL CITRATE PF 50 MCG/ML IJ SOSY
25.0000 ug | PREFILLED_SYRINGE | INTRAMUSCULAR | Status: DC | PRN
  Administered 2022-06-27 (×4): 25 ug via INTRAVENOUS

## 2022-06-27 MED ORDER — LACTATED RINGERS IV SOLN: INTRAVENOUS | Status: DC

## 2022-06-27 MED ORDER — FENTANYL CITRATE (PF) 100 MCG/2ML IJ SOLN
INTRAMUSCULAR | Status: AC
  Filled 2022-06-27: qty 2

## 2022-06-27 MED ORDER — ORAL CARE MOUTH RINSE: 15.0000 mL | Freq: Once | OROMUCOSAL | Status: AC

## 2022-06-27 MED ORDER — OXYCODONE HCL 5 MG/5ML PO SOLN: 5.0000 mg | Freq: Once | ORAL | Status: DC | PRN

## 2022-06-27 MED ORDER — CHLORHEXIDINE GLUCONATE 0.12 % MT SOLN
15.0000 mL | Freq: Once | OROMUCOSAL | Status: AC
  Administered 2022-06-27: 15 mL via OROMUCOSAL

## 2022-06-27 MED ORDER — PHENYLEPHRINE 80 MCG/ML (10ML) SYRINGE FOR IV PUSH (FOR BLOOD PRESSURE SUPPORT)
PREFILLED_SYRINGE | INTRAVENOUS | Status: DC | PRN
  Administered 2022-06-27 (×3): 80 ug via INTRAVENOUS
  Administered 2022-06-27: 40 ug via INTRAVENOUS

## 2022-06-27 MED ORDER — OXYCODONE HCL 5 MG PO TABS: 5.0000 mg | ORAL_TABLET | Freq: Once | ORAL | Status: DC | PRN

## 2022-06-27 MED ORDER — MIDAZOLAM HCL 5 MG/5ML IJ SOLN
INTRAMUSCULAR | Status: DC | PRN
  Administered 2022-06-27: 2 mg via INTRAVENOUS

## 2022-06-27 MED ORDER — ACETAMINOPHEN 500 MG PO TABS
1000.0000 mg | ORAL_TABLET | Freq: Once | ORAL | Status: AC
  Administered 2022-06-27: 1000 mg via ORAL
  Filled 2022-06-27: qty 2

## 2022-06-27 MED ORDER — CEFAZOLIN SODIUM-DEXTROSE 2-4 GM/100ML-% IV SOLN
2.0000 g | INTRAVENOUS | Status: AC
  Administered 2022-06-27: 2 g via INTRAVENOUS
  Filled 2022-06-27: qty 100

## 2022-06-27 MED ORDER — SCOPOLAMINE 1 MG/3DAYS TD PT72
1.0000 | MEDICATED_PATCH | Freq: Once | TRANSDERMAL | Status: DC
  Administered 2022-06-27: 1.5 mg via TRANSDERMAL
  Filled 2022-06-27: qty 1

## 2022-06-27 MED ORDER — AMISULPRIDE (ANTIEMETIC) 5 MG/2ML IV SOLN: 10.0000 mg | Freq: Once | INTRAVENOUS | Status: DC | PRN

## 2022-06-27 MED ORDER — PROPOFOL 10 MG/ML IV BOLUS
INTRAVENOUS | Status: DC | PRN
  Administered 2022-06-27: 170 mg via INTRAVENOUS

## 2022-06-27 MED ORDER — FENTANYL CITRATE PF 50 MCG/ML IJ SOSY
PREFILLED_SYRINGE | INTRAMUSCULAR | Status: AC
  Filled 2022-06-27: qty 1

## 2022-06-27 MED ORDER — MIDAZOLAM HCL 2 MG/2ML IJ SOLN
INTRAMUSCULAR | Status: AC
  Filled 2022-06-27: qty 2

## 2022-06-27 MED ORDER — IOHEXOL 300 MG/ML  SOLN
INTRAMUSCULAR | Status: DC | PRN
  Administered 2022-06-27: 6 mL

## 2022-06-27 MED ORDER — DEXAMETHASONE SODIUM PHOSPHATE 10 MG/ML IJ SOLN
INTRAMUSCULAR | Status: AC
  Filled 2022-06-27: qty 1

## 2022-06-27 MED ORDER — DEXAMETHASONE SODIUM PHOSPHATE 10 MG/ML IJ SOLN
INTRAMUSCULAR | Status: DC | PRN
  Administered 2022-06-27: 5 mg via INTRAVENOUS

## 2022-06-27 MED ORDER — LIDOCAINE HCL (PF) 2 % IJ SOLN
INTRAMUSCULAR | Status: AC
  Filled 2022-06-27: qty 5

## 2022-06-27 MED ORDER — ONDANSETRON HCL 4 MG/2ML IJ SOLN
INTRAMUSCULAR | Status: DC | PRN
  Administered 2022-06-27: 4 mg via INTRAVENOUS

## 2022-06-27 MED ORDER — ONDANSETRON HCL 4 MG/2ML IJ SOLN
INTRAMUSCULAR | Status: AC
  Filled 2022-06-27: qty 2

## 2022-06-27 MED ORDER — FENTANYL CITRATE (PF) 100 MCG/2ML IJ SOLN
INTRAMUSCULAR | Status: DC | PRN
  Administered 2022-06-27: 25 ug via INTRAVENOUS
  Administered 2022-06-27: 50 ug via INTRAVENOUS
  Administered 2022-06-27: 25 ug via INTRAVENOUS

## 2022-06-27 SURGICAL SUPPLY — 24 items
BAG URO CATCHER STRL LF (MISCELLANEOUS) ×2 IMPLANT
BASKET ZERO TIP NITINOL 2.4FR (BASKET) IMPLANT
BSKT STON RTRVL ZERO TP 2.4FR (BASKET)
CATH URET 5FR 28IN CONE TIP (BALLOONS)
CATH URET 5FR 70CM CONE TIP (BALLOONS) IMPLANT
CATH URETL OPEN END 6FR 70 (CATHETERS) ×2 IMPLANT
CLOTH BEACON ORANGE TIMEOUT ST (SAFETY) ×2 IMPLANT
EXTRACTOR STONE NITINOL NGAGE (UROLOGICAL SUPPLIES) IMPLANT
GLOVE BIO SURGEON STRL SZ7.5 (GLOVE) ×2 IMPLANT
GOWN STRL REUS W/ TWL XL LVL3 (GOWN DISPOSABLE) ×2 IMPLANT
GOWN STRL REUS W/TWL XL LVL3 (GOWN DISPOSABLE) ×1
GUIDEWIRE STR DUAL SENSOR (WIRE) ×2 IMPLANT
GUIDEWIRE ZIPWRE .038 STRAIGHT (WIRE) IMPLANT
KIT TURNOVER KIT A (KITS) IMPLANT
LASER FIB FLEXIVA PULSE ID 365 (Laser) IMPLANT
MANIFOLD NEPTUNE II (INSTRUMENTS) ×2 IMPLANT
PACK CYSTO (CUSTOM PROCEDURE TRAY) ×2 IMPLANT
SHEATH NAVIGATOR HD 11/13X28 (SHEATH) IMPLANT
SHEATH NAVIGATOR HD 11/13X36 (SHEATH) IMPLANT
STENT URET 6FRX24 CONTOUR (STENTS) IMPLANT
TRACTIP FLEXIVA PULS ID 200XHI (Laser) IMPLANT
TRACTIP FLEXIVA PULSE ID 200 (Laser) ×1
TUBING CONNECTING 10 (TUBING) ×2 IMPLANT
TUBING UROLOGY SET (TUBING) ×2 IMPLANT

## 2022-06-27 NOTE — Anesthesia Postprocedure Evaluation (Signed)
Anesthesia Post Note  Patient: Heidi Payne  Procedure(s) Performed: CYSTOSCOPY WITH LEFT RETROGRADE  LEFT URETEROSCOPY HOLMIUM LASER  AND STENT PLACEMENT (Left)     Patient location during evaluation: PACU Anesthesia Type: General Level of consciousness: awake and alert Pain management: pain level controlled Vital Signs Assessment: post-procedure vital signs reviewed and stable Respiratory status: spontaneous breathing, nonlabored ventilation and respiratory function stable Cardiovascular status: blood pressure returned to baseline Postop Assessment: no apparent nausea or vomiting Anesthetic complications: no   No notable events documented.  Last Vitals:  Vitals:   06/27/22 1045 06/27/22 1100  BP: (!) 142/70 137/81  Pulse: 92 92  Resp: 10 11  Temp:    SpO2: 95% 100%    Last Pain:  Vitals:   06/27/22 1110  TempSrc:   PainSc: [place]

## 2022-06-27 NOTE — Transfer of Care (Signed)
Immediate Anesthesia Transfer of Care Note  Patient: Heidi Payne  Procedure(s) Performed: CYSTOSCOPY WITH LEFT RETROGRADE  LEFT URETEROSCOPY HOLMIUM LASER  AND STENT PLACEMENT (Left)  Patient Location: PACU  Anesthesia Type:General  Level of Consciousness: drowsy and patient cooperative  Airway & Oxygen Therapy: Patient Spontanous Breathing and Patient connected to face mask oxygen  Post-op Assessment: Report given to RN and Post -op Vital signs reviewed and stable  Post vital signs: Reviewed and stable  Last Vitals:  Vitals Value Taken Time  BP 134/78 06/27/22 1025  Temp    Pulse 99 06/27/22 1027  Resp 7 06/27/22 1027  SpO2 100 % 06/27/22 1027  Vitals shown include unvalidated device data.  Last Pain:  Vitals:   06/27/22 0645  TempSrc: Oral         Complications: No notable events documented.

## 2022-06-27 NOTE — Anesthesia Procedure Notes (Addendum)
Procedure Name: LMA Insertion Date/Time: 06/27/2022 9:12 AM  Performed by: [place] Pugh, CRNAPre-anesthesia Checklist: Patient identified, Emergency Drugs available, Suction available, Patient being monitored and Timeout performed Patient Re-evaluated:Patient Re-evaluated prior to induction Oxygen Delivery Method: Circle system utilized Preoxygenation: Pre-oxygenation with 100% oxygen Induction Type: IV induction Ventilation: Mask ventilation without difficulty LMA: LMA with gastric port inserted LMA Size: 4.0 Number of attempts: 1 Placement Confirmation: positive ETCO2, CO2 detector and breath sounds checked- equal and bilateral Tube secured with: Tape Dental Injury: Teeth and Oropharynx as per pre-operative assessment

## 2022-06-27 NOTE — Op Note (Addendum)
Preoperative diagnosis: Left ureteral and left renal stones Postoperative diagnosis: Same  Procedure: Cystoscopy left retrograde pyelogram, left ureteroscopy laser lithotripsy left ureteral stent placement  Surgeon: Junious Silk  Anesthesia: General  Indication for procedure: Heidi Payne is a 57 year old female passing a left proximal stone.  She underwent shockwave lithotripsy but the stone was difficult to isolate.  Follow-up KUB showed the stone remained at L4.  She also had some stones in the kidney.  She had residual left hydronephrosis on ultrasound and was brought today for definitive stone management.  Findings: Cystoscopy was unremarkable with a normal urethra and bladder.  Left retrograde pyelogram-Stone visible on the scout about L4 on the left.  Left retrograde pyelogram demonstrated filling defect at L4 with minimal dilation proximally.  Left ureteroscopy located the stone in the left ureter and in the kidney 3-4 left lower pole stones and 2 midpole stones all dusted.  Description of procedure: After consent was obtained patient brought to the operating room.  After adequate anesthesia she was placed lithotomy position and prepped and draped in the usual sterile fashion.  Timeout was performed to confirm the patient and procedure.  Cystoscope was passed per urethra and the bladder inspected.  No stone or foreign body in the bladder.  Left ureteral orifice was cannulated with a 5 [place] open-ended catheter left retrograde injection of contrast was performed.  A sensor wire was then advanced and coiled in the upper calyx.  Single channel semirigid ureteroscope was advanced where the stone was located in the left mid to distal ureter.  14 m laser fiber advanced and the stone was dusted.  Inspection up into the proximal ureter noted there to be no other stones or stone fragments.  The sensor wire had gone submucosal for short distance at the stone impaction site and therefore under direct vision a  zip wire was passed in the true lumen and the semirigid scope backed out.  The sensor wire was removed.  I then used a short access sheath to get 2 wires back in place.  I went adjacent to the sensor wire with the access sheath leaving it as the safety.  The access sheath went without difficulty.  A dual channel digital ureteroscope was advanced to the kidney with several stones were noted.  The midpole stones were dusted with the laser and 3 lower pole stones were repositioned to the upper calyx with a engage basket for ease of fragmentation.  The stones were dusted.  One of the lower pole stone was located and dusted.  No other significant stones were noted.  The access sheath was backed out on the ureteroscope and the collecting system renal pelvis and ureter inspected on the way out noted to be without injury or stone fragment.  The wire was backloaded on the cystoscope and a 6 x 24 cm stent advanced.  The wire was removed with a good coil seen in the kidney and a good coil in the bladder.  Scope was removed.  String was secured to the patient.  She was awakened taken recovery room in stable condition.  Complications: None  Blood loss: Minimal  Specimens: None  Drains: 6 x 24 cm left ureteral stent with string  Disposition: Patient stable to PACU.

## 2022-06-27 NOTE — H&P (Signed)
H&P  Chief Complaint: left ureteral stone   History of Present Illness:    1) left ureteral stone - she had left flank pain and a 03/29/2022 CT revealed a 6 mm left proximal stone. It was difficult to see on the scout. Her WBC was 9.6, Cr 1.27. UA > 50 wbc, no rbc. On CT also renal stones - bilateral L>R renal stones about 4 mm on CT 03/29/2022.    She underwent left ESWL Dec 2023 for a left proximal stone - stone was difficult to see on KUB that day and targeted with IVP. She did not pass any fragments or sand, BUT an intact "stone" fell out of her pajama pants leg and she's not sure if it was her stone and this turned out NOT to be the stone on analysis. A f/u KUB 06/08/2022 shows a possible 5 mm fragment/stone at L4/L5. She has had some mild left flank discomfort. UA with 11-30 rbc. Renal US with mod left hydro.   She presents today for left RGP, URS, HLL and stent. She is well - no fever or congestion. No dysuria or gross hematuria.   Past Medical History:  Diagnosis Date   Anginal pain ([place])    Anxiety    Asthma    Back pain    Bilateral swelling of feet    Depression    hitory of   GERD (gastroesophageal reflux disease)    Headache    History of kidney stones    Hyperlipidemia    Hypertension    Joint pain    LGSIL of cervix of undetermined significance 03/20/2022   03/20/22 will get colpo   Pre-diabetes    Seasonal allergies    Sleep apnea    SOB (shortness of breath)    Vitamin D deficiency    Vitiligo    Past Surgical History:  Procedure Laterality Date   APPENDECTOMY     1975   CARDIAC CATHETERIZATION N/A 12/31/2015   Procedure: Left Heart Cath and Coronary Angiography;  Surgeon: Jettie Booze, MD;  Location: [facility];  Service: Cardiovascular;  Laterality: N/A;   CESAREAN SECTION W/BTL  05/16/2003   CHOLECYSTECTOMY  11/27/2011   Procedure: LAPAROSCOPIC CHOLECYSTECTOMY;  Surgeon: Jamesetta So, MD;  Location: [facility];  Service: General;  Laterality:  N/A;   COLONOSCOPY  03/19/2012   Procedure: COLONOSCOPY;  Surgeon: Jamesetta So, MD;  Location: [facility];  Service: Gastroenterology;  Laterality: N/A;   COLONOSCOPY N/A 10/26/2020   Procedure: COLONOSCOPY;  Surgeon: Aviva Signs, MD;  Location: [facility];  Service: Gastroenterology;  Laterality: N/A;   COLPOSCOPY  01/11/2015   DILATION AND CURETTAGE OF UTERUS     ENDOMETRIAL BIOPSY  01/14/2019   EXTRACORPOREAL SHOCK WAVE LITHOTRIPSY Left 05/04/2022   Procedure: LEFT EXTRACORPOREAL SHOCK WAVE LITHOTRIPSY (ESWL);  Surgeon: Bjorn Loser, MD;  Location: [facility];  Service: Urology;  Laterality: Left;   LAPAROSCOPIC SALPINGO OOPHERECTOMY Bilateral 01/22/2019   Procedure: LAPAROSCOPIC SALPINGO OOPHORECTOMY;  Surgeon: Florian Buff, MD;  Location: [facility];  Service: Gynecology;  Laterality: Bilateral;   TUBAL LIGATION      Home Medications:  Medications Prior to Admission  Medication Sig Dispense Refill Last Dose   acetaminophen (TYLENOL) 500 MG tablet Take 1,000 mg by mouth every 6 (six) hours as needed for moderate pain.   Past Month   acidophilus (RISAQUAD) CAPS capsule Take 1 capsule by mouth daily.   Past Week   albuterol (PROVENTIL  HFA;VENTOLIN HFA) 108 (90 Base) MCG/ACT inhaler Inhale into the lungs every 6 (six) hours as needed for wheezing or shortness of breath.    at 0415   amLODipine (NORVASC) 2.5 MG tablet Take 2.5 mg by mouth daily.   06/27/2022 at 0430   atorvastatin (LIPITOR) 40 MG tablet Take 40 mg by mouth at bedtime.   06/26/2022   budesonide-formoterol (SYMBICORT) 160-4.5 MCG/ACT inhaler Inhale 2 puffs into the lungs 2 (two) times daily as needed (shortness of breath).   06/27/2022 at 0415   buPROPion (WELLBUTRIN XL) 150 MG 24 hr tablet Take 150 mg by mouth daily.   06/27/2022 at 0430   clobetasol cream (TEMOVATE) [id number] % Apply 1 Application topically 2 (two) times daily. (Patient taking differently: Apply 1 Application topically daily as needed  (lichen sclerosus).) 30 g 3 Past Week   DULoxetine (CYMBALTA) 60 MG capsule Take 60 mg by mouth 2 (two) times daily.    06/27/2022 at 0415   estradiol (ESTRACE) 0.1 MG/GM vaginal cream USE 0.5 GM TWICE WEEKLY INTRAVAGINALLY 126 g 2 Past Week   fluticasone (FLONASE) 50 MCG/ACT nasal spray Place 1 spray into both nostrils daily as needed for allergies or rhinitis.   06/26/2022   ibuprofen (ADVIL) 200 MG tablet Take 400 mg by mouth every 6 (six) hours as needed for moderate pain.   06/25/2022   loratadine (CLARITIN) 10 MG tablet Take 10 mg by mouth daily.   Past Month   losartan (COZAAR) 25 MG tablet Take 25 mg by mouth daily.   06/27/2022 at 0415   montelukast (SINGULAIR) 10 MG tablet Take 10 mg by mouth at bedtime.   06/26/2022   Multiple Vitamins-Minerals (MULTIVITAMIN WITH MINERALS) tablet Take 2 tablets by mouth daily.   Past Week   omeprazole (PRILOSEC) 40 MG capsule Take 40 mg by mouth daily.   06/27/2022 at 0415   pramoxine-hydrocortisone (PROCTOCREAM-HC) 1-1 % rectal cream PLACE 1 APPLICATION RECTALLY 2 (TWO) TIMES DAILY. (Patient taking differently: Place 1 Application rectally 2 (two) times daily as needed for hemorrhoids or anal itching.) 30 g 2    tamsulosin (FLOMAX) 0.4 MG CAPS capsule Take 1 capsule (0.4 mg total) by mouth daily after supper. 15 capsule 0 06/26/2022   tamsulosin (FLOMAX) 0.4 MG CAPS capsule Take 1 capsule (0.4 mg total) by mouth daily. 30 capsule 0 06/26/2022   Vitamin D, Ergocalciferol, (DRISDOL) 1.25 MG (50000 UNIT) CAPS capsule Take 1 capsule (50,000 Units total) by mouth every 7 (seven) days. 4 capsule 0 06/22/2022   ondansetron (ZOFRAN) 4 MG tablet Take 1 tablet (4 mg total) by mouth every 6 (six) hours. (Patient taking differently: Take 4 mg by mouth every 6 (six) hours as needed for vomiting or nausea.) 12 tablet 0 Unknown   oxyCODONE-acetaminophen (PERCOCET/ROXICET) 5-325 MG tablet Take 1 tablet by mouth every 4 (four) hours as needed. (Patient not taking: Reported on  06/12/2022) 20 tablet 0 Not Taking   phentermine 15 MG capsule Take 1 capsule (15 mg total) by mouth every morning. 30 capsule 0    Allergies: No Known Allergies  Family History  Problem Relation Age of Onset   Cancer Mother        bone brain cervical   Hyperlipidemia Father    Cancer Father        colon   Diabetes Father    Hypertension Father    Cancer Maternal Aunt        breast   Cancer Maternal Aunt  breast   Cancer Maternal Aunt        breast   Cancer Paternal Aunt        esophageal, lung   Social History:  reports that she has never smoked. She has never used smokeless tobacco. She reports current alcohol use. She reports that she does not use drugs.  ROS: A complete review of systems was performed.  All systems are negative except for pertinent findings as noted. Review of Systems  All other systems reviewed and are negative.    Physical Exam:  Vital signs in last 24 hours: Temp:  [97.9 F (36.6 C)] 97.9 F (36.6 C) (02/13 0645) Pulse Rate:  [112] 112 (02/13 0645) Resp:  [20] 20 (02/13 0645) BP: (149)/(87) 149/87 (02/13 0645) SpO2:  [98 %] 98 % (02/13 0645) Weight:  [88.9 kg] 88.9 kg (02/13 0647) General:  Alert and oriented, No acute distress HEENT: Normocephalic, atraumatic Cardiovascular: Regular rate and rhythm Lungs: Regular rate and effort Abdomen: Soft, nontender, nondistended, no abdominal masses Back: No CVA tenderness Extremities: No edema Neurologic: Grossly intact  Laboratory Data:  No results found for this or any previous visit (from the past 24 hour(s)). No results found for this or any previous visit (from the past 240 hour(s)). Creatinine: No results for input(s): "CREATININE" in the last 168 hours.  Impression/Assessment:  Left ureteral stone and left renal stones -   Plan:  I discussed with the patient and Merry Proud the nature, potential benefits, risks and alternatives to cysto, left RGP, left URS/HLL, stent, including side  effects of the proposed treatment, the likelihood of the patient achieving the goals of the procedure, and any potential problems that might occur during the procedure or recuperation. Also disc need for prestent/staged procedure and risk of ureteral injury among others. All questions answered. Patient elects to proceed.    Festus Aloe 06/27/2022, 7:25 AM

## 2022-06-27 NOTE — Discharge Instructions (Signed)
Removal of the stent: Remove the stent on Friday morning 06/30/2022 by pulling the string as instructed.

## 2022-06-28 ENCOUNTER — Encounter (HOSPITAL_COMMUNITY): Payer: Self-pay | Admitting: Urology

## 2022-07-10 ENCOUNTER — Encounter (INDEPENDENT_AMBULATORY_CARE_PROVIDER_SITE_OTHER): Payer: Self-pay | Admitting: Family Medicine

## 2022-07-10 ENCOUNTER — Ambulatory Visit (INDEPENDENT_AMBULATORY_CARE_PROVIDER_SITE_OTHER): Payer: No Typology Code available for payment source | Admitting: Family Medicine

## 2022-07-10 VITALS — BP 144/83 | HR 91 | Temp 98.8°F | Ht 65.0 in | Wt 191.0 lb

## 2022-07-10 DIAGNOSIS — R0602 Shortness of breath: Secondary | ICD-10-CM | POA: Diagnosis not present

## 2022-07-10 DIAGNOSIS — E559 Vitamin D deficiency, unspecified: Secondary | ICD-10-CM | POA: Diagnosis not present

## 2022-07-10 DIAGNOSIS — E669 Obesity, unspecified: Secondary | ICD-10-CM

## 2022-07-10 DIAGNOSIS — Z6830 Body mass index (BMI) 30.0-30.9, adult: Secondary | ICD-10-CM

## 2022-07-10 DIAGNOSIS — E7849 Other hyperlipidemia: Secondary | ICD-10-CM

## 2022-07-10 DIAGNOSIS — R7303 Prediabetes: Secondary | ICD-10-CM

## 2022-07-10 DIAGNOSIS — R632 Polyphagia: Secondary | ICD-10-CM

## 2022-07-10 MED ORDER — VITAMIN D (ERGOCALCIFEROL) 1.25 MG (50000 UNIT) PO CAPS: 50000.0000 [IU] | ORAL_CAPSULE | ORAL | 0 refills | Status: AC

## 2022-07-10 NOTE — Progress Notes (Deleted)
Since last appointment patient had surgery on her kidney.  Ultimately had found to have blockage of the ureter with kidney stones and required cystoscopy with laser to break up the kidney stones.  She did has a stent placed to ensure everything drained effectively.  She is finally just now starting to feel better.  Last few weeks she has eaten the foods off the plan just not in the quantity she was prescribed.  She was drinking a significant amount of water. Next few weeks she wants to really recommit to the meal plan.  No activities, events or travel for the next few weeks.  Her birthday is coming up but no plans for that as of yet.

## 2022-07-11 LAB — VITAMIN D 25 HYDROXY (VIT D DEFICIENCY, FRACTURES): Vit D, 25-Hydroxy: 47.5 ng/mL (ref 30.0–100.0)

## 2022-07-11 LAB — INSULIN, RANDOM: INSULIN: 12.3 u[IU]/mL (ref 2.6–24.9)

## 2022-07-11 LAB — COMPREHENSIVE METABOLIC PANEL
ALT: 28 IU/L (ref 0–32)
AST: 22 IU/L (ref 0–40)
Albumin/Globulin Ratio: 1.9 (ref 1.2–2.2)
Albumin: 4.6 g/dL (ref 3.8–4.9)
Alkaline Phosphatase: 109 IU/L (ref 44–121)
BUN/Creatinine Ratio: 15 (ref 9–23)
BUN: 12 mg/dL (ref 6–24)
Bilirubin Total: 0.4 mg/dL (ref 0.0–1.2)
CO2: 24 mmol/L (ref 20–29)
Calcium: 9.5 mg/dL (ref 8.7–10.2)
Chloride: 103 mmol/L (ref 96–106)
Creatinine, Ser: 0.79 mg/dL (ref 0.57–1.00)
Globulin, Total: 2.4 g/dL (ref 1.5–4.5)
Glucose: 88 mg/dL (ref 70–99)
Potassium: 3.9 mmol/L (ref 3.5–5.2)
Sodium: 144 mmol/L (ref 134–144)
Total Protein: 7 g/dL (ref 6.0–8.5)
eGFR: 88 mL/min/{1.73_m2} (ref >59)

## 2022-07-11 LAB — LIPID PANEL WITH LDL/HDL RATIO
Cholesterol, Total: 184 mg/dL (ref 100–199)
HDL: 62 mg/dL (ref >39)
LDL Chol Calc (NIH): 101 mg/dL — ABNORMAL HIGH (ref 0–99)
LDL/HDL Ratio: 1.6 ratio (ref 0.0–3.2)
Triglycerides: 119 mg/dL (ref 0–149)
VLDL Cholesterol Cal: 21 mg/dL (ref 5–40)

## 2022-07-11 LAB — HEMOGLOBIN A1C
Est. average glucose Bld gHb Est-mCnc: 117 mg/dL
Hgb A1c MFr Bld: 5.7 % — ABNORMAL HIGH (ref 4.8–5.6)

## 2022-07-24 NOTE — Progress Notes (Unsigned)
Chief Complaint:   OBESITY Heidi Payne is here to discuss her progress with her obesity treatment plan along with follow-up of her obesity related diagnoses. Heidi Payne is on the Category 3 Plan and states she is following her eating plan approximately 60% of the time. Heidi Payne states she is not exercising.  Today's visit was #: 86 Starting weight: 215 LBS Starting date: 01/27/2021 Today's weight: 191 LBS Today's date: 07/10/2022 Total lbs lost to date: 24 LBS Total lbs lost since last in-office visit: +3 LBS  Interim History: Since last appointment patient had surgery on her kidney. Ultimately had found to have blockage of the ureter with kidney stones and required cystoscopy with laser to break up the kidney stones. She did has a stent placed to ensure everything drained effectively. She is finally just now starting to feel better. Last few weeks she has eaten the foods off the plan just not in the quantity she was prescribed. She was drinking a significant amount of water. Next few weeks she wants to really recommit to the meal plan. No activities, events or travel for the next few weeks. Her birthday is coming up but no plans for that as of yet.   Subjective:   1. SOBOE (shortness of breath on exertion) Patient's symptoms on 01/27/2021 have minimally improved.  RMR at that time 2059.  Patient has not been consistent in terms of activity.  2. Prediabetes Patient's last A1c not at goal.  Patient is not taking any medications.  3. Other hyperlipidemia Patient is on Lipitor with no side effects noted.  Patient LDL slightly above goal.  4. Vitamin D deficiency Patient is on prescription vitamin D.  Patient denies nausea, vomiting, muscle weakness but positive for fatigue.  5. Polyphagia Patient is on phentermine, previously on combo of phentermine/topiramate.  Patient had minimal loss on phentermine alone.  Assessment/Plan:   1. SOBOE (shortness of breath on exertion) IC today, RMR  1728.  Patient can continue category 3 with focus on ensuring adequate and complete intake of food for the next 3 weeks.  2. Prediabetes Check labs today.  - Comprehensive metabolic panel - Hemoglobin A1c - Insulin, random  3. Other hyperlipidemia Check labs today.  - Lipid Panel With LDL/HDL Ratio  4. Vitamin D deficiency Check labs today.  Refill- Vitamin D, Ergocalciferol, (DRISDOL) 1.25 MG (50000 UNIT) CAPS capsule; Take 1 capsule (50,000 Units total) by mouth every 7 (seven) days.  Dispense: 4 capsule; Refill: 0  - VITAMIN D 25 Hydroxy (Vit-D Deficiency, Fractures)  5. Polyphagia Patient has not had any weight loss on phentermine alone, stop prescription.  6. Obesity with current BMI of 30.9 Heidi Payne is currently in the action stage of change. As such, her goal is to continue with weight loss efforts. She has agreed to the Category 3 Plan +8 ounces of protein at supper..   Exercise goals: All adults should avoid inactivity. Some physical activity is better than none, and adults who participate in any amount of physical activity gain some health benefits.  Behavioral modification strategies: increasing lean protein intake, meal planning and cooking strategies, keeping healthy foods in the home, and planning for success.  Heidi Payne has agreed to follow-up with our clinic in 4 weeks. She was informed of the importance of frequent follow-up visits to maximize her success with intensive lifestyle modifications for her multiple health conditions.   Objective:   Blood pressure (!) 144/83, pulse 91, temperature 98.8 F (37.1 C), height '5\' 5"'$  (1.651 m),  weight 191 lb (86.6 kg), last menstrual period 12/12/2018, SpO2 99 %. Body mass index is 31.78 kg/m.  General: Cooperative, alert, well developed, in no acute distress. HEENT: Conjunctivae and lids unremarkable. Cardiovascular: Regular rhythm.  Lungs: Normal work of breathing. Neurologic: No focal deficits.   Lab Results   Component Value Date   CREATININE 0.79 07/10/2022   BUN 12 07/10/2022   NA 144 07/10/2022   K 3.9 07/10/2022   CL 103 07/10/2022   CO2 24 07/10/2022   Lab Results  Component Value Date   ALT 28 07/10/2022   AST 22 07/10/2022   ALKPHOS 109 07/10/2022   BILITOT 0.4 07/10/2022   Lab Results  Component Value Date   HGBA1C 5.7 (H) 07/10/2022   HGBA1C 5.3 06/14/2022   HGBA1C 5.7 02/07/2022   HGBA1C 6.1 (H) 01/27/2021   Lab Results  Component Value Date   INSULIN 12.3 07/10/2022   INSULIN 26.6 (H) 01/27/2021   Lab Results  Component Value Date   TSH 1.01 02/07/2022   Lab Results  Component Value Date   CHOL 184 07/10/2022   HDL 62 07/10/2022   LDLCALC 101 (H) 07/10/2022   TRIG 119 07/10/2022   CHOLHDL 5.6 12/30/2015   Lab Results  Component Value Date   VD25OH 47.5 07/10/2022   VD25OH 46.8 02/07/2022   VD25OH 40.5 09/05/2021   Lab Results  Component Value Date   WBC 4.7 06/14/2022   HGB 13.9 06/14/2022   HCT 42.3 06/14/2022   MCV 86.3 06/14/2022   PLT 242 06/14/2022   No results found for: "IRON", "TIBC", "FERRITIN"  Attestation Statements:   Reviewed by clinician on day of visit: allergies, medications, problem list, medical history, surgical history, family history, social history, and previous encounter notes.  I, Davy Pique, RMA, am acting as transcriptionist for Coralie Common, MD.  I have reviewed the above documentation for accuracy and completeness, and I agree with the above. - Coralie Common, MD

## 2022-07-31 ENCOUNTER — Ambulatory Visit (INDEPENDENT_AMBULATORY_CARE_PROVIDER_SITE_OTHER): Payer: No Typology Code available for payment source | Admitting: Family Medicine

## 2022-08-07 ENCOUNTER — Ambulatory Visit (HOSPITAL_COMMUNITY)
Admission: RE | Admit: 2022-08-07 | Discharge: 2022-08-07 | Disposition: A | Discharge status: Home / Self Care | Payer: No Typology Code available for payment source | Source: Ambulatory Visit | Attending: Urology | Admitting: Urology

## 2022-08-07 DIAGNOSIS — N200 Calculus of kidney: Secondary | ICD-10-CM | POA: Insufficient documentation

## 2022-08-09 ENCOUNTER — Other Ambulatory Visit: Payer: Self-pay | Admitting: Adult Health

## 2022-08-14 ENCOUNTER — Ambulatory Visit (INDEPENDENT_AMBULATORY_CARE_PROVIDER_SITE_OTHER): Payer: No Typology Code available for payment source | Admitting: Urology

## 2022-08-14 ENCOUNTER — Encounter: Payer: Self-pay | Admitting: Urology

## 2022-08-14 VITALS — BP 140/86 | HR 87

## 2022-08-14 DIAGNOSIS — Z87442 Personal history of urinary calculi: Secondary | ICD-10-CM | POA: Diagnosis not present

## 2022-08-14 DIAGNOSIS — N201 Calculus of ureter: Secondary | ICD-10-CM

## 2022-08-14 DIAGNOSIS — N200 Calculus of kidney: Secondary | ICD-10-CM

## 2022-08-14 LAB — URINALYSIS, ROUTINE W REFLEX MICROSCOPIC
Bilirubin, UA: NEGATIVE
Glucose, UA: NEGATIVE
Ketones, UA: NEGATIVE
Nitrite, UA: NEGATIVE
Protein,UA: NEGATIVE
Specific Gravity, UA: 1.01 (ref 1.005–1.030)
Urobilinogen, Ur: 0.2 mg/dL (ref 0.2–1.0)
pH, UA: 6 (ref 5.0–7.5)

## 2022-08-14 LAB — MICROSCOPIC EXAMINATION: Bacteria, UA: NONE SEEN

## 2022-08-14 NOTE — Progress Notes (Unsigned)
08/14/2022 9:33 AM   Heidi Payne 1965/08/02 [id number]  Referring provider: Caryl Bis, MD [address] [place],  [place] [address]  No chief complaint on file.   HPI: F/u -   1) left ureteral stone - she had left flank pain and a 03/29/2022 CT revealed a 6 mm left proximal stone. It was difficult to see on the scout. Her WBC was 9.6, Cr 1.27. UA > 50 wbc, no rbc. On CT also renal stones - bilateral L>R renal stones about 4 mm on CT 03/29/2022.     She underwent left ESWL Dec 2023 for a left proximal stone - stone was difficult to see on KUB that day and targeted with IVP. She did not pass any fragments or sand, BUT an intact "stone" fell out of her pajama pants leg and she's not sure if it was her stone and this turned out NOT to be the stone on analysis. A f/u KUB 06/08/2022 shows a possible 5 mm fragment/stone at L4/L5. She has had some mild left flank discomfort. UA with 11-30 rbc. Renal US with mod left hydro.    She underwent left RGP, URS, HLL and stent 06/27/2022 - Left ureteroscopy located the stone in the left ureter and in the kidney 3-4 left lower pole stones and 2 midpole stones all dusted.   Today, she is well.  On August 07, 2022 renal ultrasound revealed no hydronephrosis.  No echogenic foci noted. She passed a few small fragments after she pulled her stents. No flank pain or hematuria.    PMH: Past Medical History:  Diagnosis Date   Anginal pain (HCC)    Anxiety    Asthma    Back pain    Bilateral swelling of feet    Depression    hitory of   GERD (gastroesophageal reflux disease)    Headache    History of kidney stones    Hyperlipidemia    Hypertension    Joint pain    LGSIL of cervix of undetermined significance 03/20/2022   03/20/22 will get colpo   Pre-diabetes    Seasonal allergies    Sleep apnea    SOB (shortness of breath)    Vitamin D deficiency    Vitiligo     Surgical History: Past Surgical History:  Procedure Laterality Date    [place] N/A 12/31/2015   Procedure: Left Heart Cath and Coronary Angiography;  Surgeon: Jettie Booze, MD;  Location: [facility];  Service: Cardiovascular;  Laterality: N/A;   CESAREAN SECTION W/BTL  05/16/2003   CHOLECYSTECTOMY  11/27/2011   Procedure: LAPAROSCOPIC CHOLECYSTECTOMY;  Surgeon: Jamesetta So, MD;  Location: [facility];  Service: General;  Laterality: N/A;   COLONOSCOPY  03/19/2012   Procedure: COLONOSCOPY;  Surgeon: Jamesetta So, MD;  Location: [facility];  Service: Gastroenterology;  Laterality: N/A;   COLONOSCOPY N/A 10/26/2020   Procedure: COLONOSCOPY;  Surgeon: Aviva Signs, MD;  Location: [facility];  Service: Gastroenterology;  Laterality: N/A;   COLPOSCOPY  01/11/2015   CYSTOSCOPY WITH RETROGRADE PYELOGRAM, URETEROSCOPY AND STENT PLACEMENT Left 06/27/2022   Procedure: CYSTOSCOPY WITH LEFT RETROGRADE  LEFT URETEROSCOPY HOLMIUM LASER  AND STENT PLACEMENT;  Surgeon: Festus Aloe, MD;  Location: [facility];  Service: Urology;  Laterality: Left;  [place] AND CURETTAGE OF UTERUS     ENDOMETRIAL BIOPSY  01/14/2019   EXTRACORPOREAL SHOCK WAVE  LITHOTRIPSY Left 05/04/2022   Procedure: LEFT EXTRACORPOREAL SHOCK WAVE LITHOTRIPSY (ESWL);  Surgeon: Bjorn Loser, MD;  Location: [facility];  Service: Urology;  Laterality: Left;   LAPAROSCOPIC SALPINGO OOPHERECTOMY Bilateral 01/22/2019   Procedure: LAPAROSCOPIC SALPINGO OOPHORECTOMY;  Surgeon: Florian Buff, MD;  Location: [facility];  Service: Gynecology;  Laterality: Bilateral;   TUBAL LIGATION      Home Medications:  Allergies as of 08/14/2022   No Known Allergies      Medication List        Accurate as of August 14, 2022  9:33 AM. If you have any questions, ask your nurse or doctor.          acetaminophen 500 MG tablet Commonly known as: TYLENOL Take 1,000 mg by mouth every 6 (six) hours as needed for moderate pain.    acidophilus Caps capsule Take 1 capsule by mouth daily.   albuterol 108 (90 Base) MCG/ACT inhaler Commonly known as: VENTOLIN HFA Inhale into the lungs every 6 (six) hours as needed for wheezing or shortness of breath.   amLODipine 2.5 MG tablet Commonly known as: NORVASC Take 2.5 mg by mouth daily.   atorvastatin 40 MG tablet Commonly known as: LIPITOR Take 40 mg by mouth at bedtime.   budesonide-formoterol 160-4.5 MCG/ACT inhaler Commonly known as: SYMBICORT Inhale 2 puffs into the lungs 2 (two) times daily as needed (shortness of breath).   buPROPion 150 MG 24 hr tablet Commonly known as: WELLBUTRIN XL Take 150 mg by mouth daily.   clobetasol cream 0.05 % Commonly known as: TEMOVATE Apply 2-3 x weekly to affected areas   DULoxetine 60 MG capsule Commonly known as: CYMBALTA Take 60 mg by mouth 2 (two) times daily.   estradiol 0.1 MG/GM vaginal cream Commonly known as: ESTRACE USE 0.5 GM TWICE WEEKLY INTRAVAGINALLY   fluticasone 50 MCG/ACT nasal spray Commonly known as: FLONASE Place 1 spray into both nostrils daily as needed for allergies or rhinitis.   ibuprofen 200 MG tablet Commonly known as: ADVIL Take 400 mg by mouth every 6 (six) hours as needed for moderate pain.   loratadine 10 MG tablet Commonly known as: CLARITIN Take 10 mg by mouth daily.   losartan 25 MG tablet Commonly known as: COZAAR Take 25 mg by mouth daily.   montelukast 10 MG tablet Commonly known as: SINGULAIR Take 10 mg by mouth at bedtime.   multivitamin with minerals tablet Take 1 tablet by mouth daily.   omeprazole 40 MG capsule Commonly known as: PRILOSEC Take 40 mg by mouth daily.   ondansetron 4 MG tablet Commonly known as: ZOFRAN Take 1 tablet (4 mg total) by mouth every 6 (six) hours. What changed:  when to take this reasons to take this   oxyCODONE-acetaminophen 5-325 MG tablet Commonly known as: PERCOCET/ROXICET Take 1 tablet by mouth every 4 (four) hours as  needed.   phentermine 15 MG capsule Take 1 capsule (15 mg total) by mouth every morning.   pramoxine-hydrocortisone 1-1 % rectal cream Commonly known as: PROCTOCREAM-HC PLACE 1 APPLICATION RECTALLY 2 (TWO) TIMES DAILY. What changed:  when to take this reasons to take this   tamsulosin 0.4 MG Caps capsule Commonly known as: FLOMAX Take 1 capsule (0.4 mg total) by mouth daily after supper.   tamsulosin 0.4 MG Caps capsule Commonly known as: FLOMAX Take 1 capsule (0.4 mg total) by mouth daily.   Vitamin D (Ergocalciferol) 1.25 MG (50000 UNIT) Caps capsule Commonly known as: DRISDOL Take 1 capsule (  50,000 Units total) by mouth every 7 (seven) days.        Allergies: No Known Allergies  Family History: Family History  Problem Relation Age of Onset   Cancer Mother        bone brain cervical   Hyperlipidemia Father    Cancer Father        colon   Diabetes Father    Hypertension Father    Cancer Maternal Aunt        breast   Cancer Maternal Aunt        breast   Cancer Maternal Aunt        breast   Cancer Paternal Aunt        esophageal, lung    Social History:  reports that she has never smoked. She has never used smokeless tobacco. She reports current alcohol use. She reports that she does not use drugs.   Physical Exam: BP (!) 140/86   Pulse 87   LMP 12/12/2018   Constitutional:  Alert and oriented, No acute distress. HEENT: [place] AT, moist mucus membranes.  Trachea midline, no masses. Cardiovascular: No clubbing, cyanosis, or edema. Respiratory: Normal respiratory effort, no increased work of breathing. GI: Abdomen is soft, nontender, nondistended, no abdominal masses GU: No CVA tenderness Skin: No rashes, bruises or suspicious lesions. Neurologic: Grossly intact, no focal deficits, moving all 4 extremities. Psychiatric: Normal mood and affect.  Laboratory Data: Lab Results  Component Value Date   WBC 4.7 06/14/2022   HGB 13.9 06/14/2022   HCT 42.3  06/14/2022   MCV 86.3 06/14/2022   PLT 242 06/14/2022    Lab Results  Component Value Date   CREATININE 0.79 07/10/2022    No results found for: "PSA"  No results found for: "TESTOSTERONE"  Lab Results  Component Value Date   HGBA1C 5.7 (H) 07/10/2022    Urinalysis    Component Value Date/Time   COLORURINE YELLOW 03/29/2022 1515   APPEARANCEUR Clear 05/29/2022 0951   LABSPEC 1.023 03/29/2022 1515   PHURINE 6.0 03/29/2022 1515   GLUCOSEU Negative 05/29/2022 [place] 03/29/2022 1515   BILIRUBINUR Negative 05/29/2022 [place] 03/29/2022 1515   PROTEINUR Negative 05/29/2022 0951   PROTEINUR 30 (A) 03/29/2022 1515   NITRITE Negative 05/29/2022 0951   NITRITE NEGATIVE 03/29/2022 1515   LEUKOCYTESUR Trace (A) 05/29/2022 0951   LEUKOCYTESUR LARGE (A) 03/29/2022 1515    Lab Results  Component Value Date   LABMICR See below: 05/29/2022   WBCUA 6-10 (A) 05/29/2022   LABEPIT 0-10 05/29/2022   BACTERIA None seen 05/29/2022    Pertinent Imaging: Renal ultrasound and CT images reviewed Results for orders placed during the hospital encounter of 05/29/22  DG Abd 1 View  Narrative CLINICAL DATA:  Status post extracorporeal shock wave lithotripsy  EXAM: ABDOMEN - 1 VIEW  COMPARISON:  Abdominal radiograph dated 05/04/2022  FINDINGS: Nonobstructive bowel gas pattern. No free air or pneumatosis. Moderate volume stool throughout the colon. Multifocal radiodensities project over the left kidney in keeping with nephrolithiasis. Ovoid radiodensity projecting over the medial left lower quadrant at the level of L4-5 measures 5 mm and may reflect a mid left ureteral stone. No acute or substantial osseous abnormality. The sacrum and coccyx are partially obscured by overlying bowel contents. Right upper quadrant surgical clips.  IMPRESSION: 1. Multifocal radiodensities project over the left kidney in keeping with nephrolithiasis. 2. Ovoid  radiodensity projecting over the medial left lower quadrant at the level  of L4-5 measures 5 mm and may reflect a mid left ureteral stone.   Electronically Signed By: Darrin Nipper M.D. On: 05/29/2022 09:40 Results for orders placed in visit on 06/13/22  US RENAL  Narrative CLINICAL DATA:  Renal stones  EXAM: RENAL / URINARY TRACT ULTRASOUND COMPLETE  COMPARISON:  None Available.  FINDINGS: Right Kidney:  Renal measurements: 12.2 x 5.3 x 6.0 cm = volume: 202 mL. Echogenicity within normal limits. No mass or hydronephrosis visualized.  Left Kidney:  Renal measurements: 11.7 x 5.5 x 5.8 cm = volume: 196 mL. Echogenicity within normal limits. No mass or hydronephrosis visualized.  Bladder:  Appears normal for degree of bladder distention.  Other:  None.  IMPRESSION: Normal study.   Electronically Signed By: Dorise Bullion III M.D. On: 08/07/2022 10:43  No valid procedures specified. No results found for this or any previous visit.  Results for orders placed during the hospital encounter of 03/29/22  CT Renal Stone Study  Narrative CLINICAL DATA:  Abdominal pain, left flank pain  EXAM: CT ABDOMEN AND PELVIS WITHOUT CONTRAST  TECHNIQUE: Multidetector CT imaging of the abdomen and pelvis was performed following the standard protocol without IV contrast.  RADIATION DOSE REDUCTION: This exam was performed according to the departmental dose-optimization program which includes automated exposure control, adjustment of the mA and/or kV according to patient size and/or use of iterative reconstruction technique.  COMPARISON:  None Available.  FINDINGS: Lower chest: Small linear densities are seen in left lower lobe suggesting scarring or minimal subsegmental atelectasis.  Hepatobiliary: There are multiple low-density lesions of varying sizes largest measuring 4.9 cm. There is fluid attenuation in these lesions suggesting possible hepatic cysts. There is  no dilation of bile ducts. Surgical clips are seen in gallbladder fossa.  Pancreas: No focal abnormalities are seen.  Spleen: Unremarkable.  Adrenals/Urinary Tract: Adrenals are unremarkable. There is mild left hydronephrosis. There is 8 mm calculus in the proximal left ureter at L3-L4 level. There are multiple bilateral renal stones, more so in the left kidney. There is mild left perinephric stranding. Distal left ureter is not dilated. Urinary bladder is unremarkable.  Stomach/Bowel: Stomach is unremarkable. Small bowel loops are not dilated. Appendix is not seen. There is no significant wall thickening in colon. Moderate to large amount of stool is seen in proximal colon. There is no fecal impaction in rectum. Few diverticula are seen in colon without signs of focal acute diverticulitis.  Vascular/Lymphatic: Scattered arterial calcifications are seen.  Reproductive: Uterus is retroverted.  There are no adnexal masses.  Other: There is no ascites or pneumoperitoneum. Umbilical hernia containing fat is seen. Bilateral inguinal hernias containing fat are seen, larger on the left side.  Musculoskeletal: Degenerative changes are noted in lumbar spine, more so at the L2-L3 level.  IMPRESSION: There is 8 mm calculus in the proximal left ureter close to the ureteropelvic junction causing mild left hydronephrosis. Bilateral renal stones, more so in the left kidney.  There is no evidence of intestinal obstruction or pneumoperitoneum.  Multiple hepatic cysts. Few diverticula are seen in colon without signs of focal acute diverticulitis. Moderate to large amount of stool is seen in proximal colon.  Other findings as described in the body of the report.   Electronically Signed By: Elmer Picker M.D. On: 03/29/2022 17:57   Assessment & Plan:    1. Kidney stones, left ureteral stone-now stone free.  We discussed dietary changes to prevent stones.  I will see her back in  a year with a renal ultrasound prior.  - Urinalysis, Routine w reflex microscopic   No follow-ups on file.  Festus Aloe, MD  [facility]  [address] [place], [place] [address] [phone]

## 2022-08-21 ENCOUNTER — Ambulatory Visit (INDEPENDENT_AMBULATORY_CARE_PROVIDER_SITE_OTHER): Payer: No Typology Code available for payment source | Admitting: Family Medicine

## 2022-08-24 ENCOUNTER — Ambulatory Visit (INDEPENDENT_AMBULATORY_CARE_PROVIDER_SITE_OTHER): Payer: No Typology Code available for payment source | Admitting: Family Medicine

## 2022-08-28 ENCOUNTER — Ambulatory Visit: Payer: No Typology Code available for payment source | Admitting: Urology

## 2022-08-28 LAB — CALCULI, WITH PHOTOGRAPH (CLINICAL LAB)
Calcium Oxalate Monohydrate: 10 %
Hydroxyapatite: 90 %
Weight Calculi: 6 mg

## 2022-09-11 ENCOUNTER — Ambulatory Visit (INDEPENDENT_AMBULATORY_CARE_PROVIDER_SITE_OTHER): Payer: No Typology Code available for payment source | Admitting: Family Medicine

## 2022-09-18 ENCOUNTER — Encounter: Payer: Self-pay | Admitting: Neurology

## 2022-09-18 ENCOUNTER — Other Ambulatory Visit: Payer: Self-pay

## 2022-09-18 DIAGNOSIS — R202 Paresthesia of skin: Secondary | ICD-10-CM

## 2022-09-22 ENCOUNTER — Ambulatory Visit (INDEPENDENT_AMBULATORY_CARE_PROVIDER_SITE_OTHER): Payer: No Typology Code available for payment source | Admitting: Neurology

## 2022-09-22 DIAGNOSIS — R202 Paresthesia of skin: Secondary | ICD-10-CM

## 2022-09-22 DIAGNOSIS — M5412 Radiculopathy, cervical region: Secondary | ICD-10-CM

## 2022-09-22 NOTE — Procedures (Signed)
[facility]  [address] [place], [address]  [place], [place] [address] Tel: [phone] Fax: [phone] Test Date:  09/22/2022  Patient: Heidi Payne DOB: June 12, 1965 Physician: Nita Sickle, DO  Sex: Female Height: 5\' 5"  Ref Phys: Donzetta Sprung, MD  ID#: [id number]   Technician:    History: This is a 57 year old female referred for evaluation of bilateral arm paresthesias.  NCV & EMG Findings: Extensive electrodiagnostic testing of the right upper extremity and additional studies of the left shows:  Bilateral median, ulnar, and mixed palmar sensory responses are within normal limits. Bilateral median and ulnar motor responses are within normal limits. Chronic motor axon loss changes are seen affecting the C5-6 myotomes bilaterally, without accompanying active denervation.  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 There is no evidence of carpal tunnel syndrome affecting the upper extremities.   ___________________________ Nita Sickle, DO    Nerve Conduction Studies   Stim Site NR Peak (ms) Norm Peak (ms) O-P Amp (V) Norm O-P Amp  Left Median Anti Sensory (2nd Digit)  32 C  Wrist    2.8 <3.6 33.6 >15  Right Median Anti Sensory (2nd Digit)  32 C  Wrist    3.1 <3.6 37.0 >15  Left Ulnar Anti Sensory (5th Digit)  32 C  Wrist    2.7 <3.1 36.0 >10  Right Ulnar Anti Sensory (5th Digit)  32 C  Wrist    2.6 <3.1 34.0 >10     Stim Site NR Onset (ms) Norm Onset (ms) O-P Amp (mV) Norm O-P Amp Site1 Site2 Delta-0 (ms) Dist (cm) Vel (m/s) Norm Vel (m/s)  Left Median Motor (Abd Poll Brev)  32 C  Wrist    2.7 <4.0 12.3 >6 Elbow Wrist 5.0 28.0 56 >50  Elbow    7.7  12.1         Right Median Motor (Abd Poll Brev)  32 C  Wrist    3.1 <4.0 12.1 >6 Elbow Wrist 5.3 28.0 53 >50  Elbow    8.4  11.8         Left Ulnar Motor (Abd Dig Minimi)  32 C  Wrist    2.0 <3.1 8.6 >7 B Elbow Wrist 3.5 21.0 60 >50  B Elbow     5.5  8.5  A Elbow B Elbow 1.3 8.0 62 >50  A Elbow    6.8  8.4         Right Ulnar Motor (Abd Dig Minimi)  32 C  Wrist    2.2 <3.1 9.1 >7 B Elbow Wrist 3.6 21.0 58 >50  B Elbow    5.8  8.6  A Elbow B Elbow 1.7 10.0 59 >50  A Elbow    7.5  8.4            Stim Site NR Peak (ms) Norm Peak (ms) P-T Amp (V) Site1 Site2 Delta-P (ms) Norm Delta (ms)  Left Median/Ulnar Palm Comparison (Wrist - 8cm)  32 C  Median Palm    1.7 <2.2 79.8 Median Palm Ulnar Palm 0.2   Ulnar Palm    1.5 <2.2 18.7      Right Median/Ulnar Palm Comparison (Wrist - 8cm)  32 C  Median Palm    1.8 <2.2 89.2 Median Palm Ulnar Palm 0.0   Ulnar Palm    1.8 <2.2 20.0       Electromyography   Side Muscle Ins.Act Fibs Fasc Recrt Amp Dur Poly Activation Comment  Right 1stDorInt Nml Nml Nml Nml Nml Nml Nml Nml N/A  Right PronatorTeres Nml Nml Nml *2- *1+ *1+ *1+ Nml N/A  Right Biceps Nml Nml Nml *2- *1+ *1+ *1+ Nml N/A  Right Triceps Nml Nml Nml Nml Nml Nml Nml Nml N/A  Right Deltoid Nml Nml Nml *1- *1+ *1+ *1+ Nml N/A  Left 1stDorInt Nml Nml Nml Nml Nml Nml Nml Nml N/A  Left PronatorTeres Nml Nml Nml *1- *1+ *1+ *1+ Nml N/A  Left Biceps Nml Nml Nml *1- *1+ *1+ *1+ Nml N/A  Left Triceps Nml Nml Nml Nml Nml Nml Nml Nml N/A  Left Deltoid Nml Nml Nml *1- *1+ *1+ *1+ Nml N/A      Waveforms:

## 2022-10-19 ENCOUNTER — Encounter: Payer: No Typology Code available for payment source | Admitting: Neurology

## 2023-02-11 ENCOUNTER — Other Ambulatory Visit: Payer: Self-pay

## 2023-02-11 ENCOUNTER — Emergency Department (HOSPITAL_COMMUNITY)
Admission: EM | Admit: 2023-02-11 | Discharge: 2023-02-11 | Disposition: A | Discharge status: Home / Self Care | Payer: No Typology Code available for payment source | Attending: Emergency Medicine | Admitting: Emergency Medicine

## 2023-02-11 ENCOUNTER — Encounter (HOSPITAL_COMMUNITY): Payer: Self-pay | Admitting: Emergency Medicine

## 2023-02-11 ENCOUNTER — Emergency Department (HOSPITAL_COMMUNITY): Payer: No Typology Code available for payment source

## 2023-02-11 DIAGNOSIS — R519 Headache, unspecified: Secondary | ICD-10-CM | POA: Diagnosis present

## 2023-02-11 DIAGNOSIS — I10 Essential (primary) hypertension: Secondary | ICD-10-CM | POA: Insufficient documentation

## 2023-02-11 DIAGNOSIS — Z79899 Other long term (current) drug therapy: Secondary | ICD-10-CM | POA: Insufficient documentation

## 2023-02-11 LAB — BASIC METABOLIC PANEL
Anion gap: 6 (ref 5–15)
BUN: 12 mg/dL (ref 6–20)
CO2: 27 mmol/L (ref 22–32)
Calcium: 9 mg/dL (ref 8.9–10.3)
Chloride: 105 mmol/L (ref 98–111)
Creatinine, Ser: 0.76 mg/dL (ref 0.44–1.00)
GFR, Estimated: >60 mL/min (ref >60)
Glucose, Bld: 104 mg/dL — ABNORMAL HIGH (ref 70–99)
Potassium: 3.4 mmol/L — ABNORMAL LOW (ref 3.5–5.1)
Sodium: 138 mmol/L (ref 135–145)

## 2023-02-11 LAB — CBC
HCT: 40.9 % (ref 36.0–46.0)
Hemoglobin: 14 g/dL (ref 12.0–15.0)
MCH: 28.9 pg (ref 26.0–34.0)
MCHC: 34.2 g/dL (ref 30.0–36.0)
MCV: 84.5 fL (ref 80.0–100.0)
Platelets: 265 10*3/uL (ref 150–400)
RBC: 4.84 MIL/uL (ref 3.87–5.11)
RDW: 12.7 % (ref 11.5–15.5)
WBC: 5.8 10*3/uL (ref 4.0–10.5)
nRBC: 0 % (ref 0.0–0.2)

## 2023-02-11 LAB — TROPONIN I (HIGH SENSITIVITY)
Troponin I (High Sensitivity): 3 ng/L (ref <18)
Troponin I (High Sensitivity): 4 ng/L (ref <18)

## 2023-02-11 MED ORDER — SODIUM CHLORIDE 0.9 % IV BOLUS
500.0000 mL | Freq: Once | INTRAVENOUS | Status: AC
  Administered 2023-02-11: 500 mL via INTRAVENOUS

## 2023-02-11 MED ORDER — KETOROLAC TROMETHAMINE 15 MG/ML IJ SOLN
15.0000 mg | Freq: Once | INTRAMUSCULAR | Status: AC
  Administered 2023-02-11: 15 mg via INTRAVENOUS
  Filled 2023-02-11: qty 1

## 2023-02-11 MED ORDER — DIPHENHYDRAMINE HCL 50 MG/ML IJ SOLN
25.0000 mg | Freq: Once | INTRAMUSCULAR | Status: AC
  Administered 2023-02-11: 25 mg via INTRAVENOUS
  Filled 2023-02-11: qty 1

## 2023-02-11 MED ORDER — METOCLOPRAMIDE HCL 5 MG/ML IJ SOLN
10.0000 mg | Freq: Once | INTRAMUSCULAR | Status: AC
  Administered 2023-02-11: 10 mg via INTRAVENOUS
  Filled 2023-02-11: qty 2

## 2023-02-11 NOTE — ED Provider Notes (Signed)
[place] EMERGENCY DEPARTMENT AT [facility] Provider Note   CSN: [id number] Arrival date & time: 02/11/23  7829     History  Chief Complaint  Patient presents with   Headache   Hypertension   Chest Pain    Heidi Payne is a 57 y.o. female.   Headache Hypertension Associated symptoms include chest pain and headaches.  Chest Pain Associated symptoms: headache     57 year old female presents emergency department with complaints of elevated blood pressure, headache, chest pain.  Patient states that she began with left retro-orbital headache that began yesterday.  Reports gradual onset headache which is worsened since onset.  States has been taking Motrin which is been "taking the edge off the headache."  Reports history of headache but usually experiences them in the forehead region.  States she has not had a headache behind her left eye before.  States she went to check her blood pressure given abnormal headache and found it to be elevated in the 150s/160s systolic multiple times.  On the way to the emergency department for evaluation of headache, she began to feel chest tightness.  States that she is unaware of whether or not it is due to increased anxious feelings.  Denies any fever, shortness of breath, abdominal pain, nausea, vomiting, urinary symptoms, change in bowel habits.  Denies any visual disturbance, gait abnormality, slurred speech, facial droop, weakness/sensory deficits in upper or lower extremities from baseline.  Patient reports history of cervical spine surgery and has had weakness in bilateral upper extremities as well as a tingling sensation in her right upper extremity that has been present intermittent unchanged since procedure.  Past medical history significant for GERD, hypertension, hyperlipidemia, depression,  Home Medications Prior to Admission medications   Medication Sig Start Date End Date Taking? Authorizing Provider  acetaminophen  (TYLENOL) 500 MG tablet Take 1,000 mg by mouth every 6 (six) hours as needed for moderate pain.    [provider]  acidophilus (RISAQUAD) CAPS capsule Take 1 capsule by mouth daily.    [provider]  albuterol (PROVENTIL HFA;VENTOLIN HFA) 108 (90 Base) MCG/ACT inhaler Inhale into the lungs every 6 (six) hours as needed for wheezing or shortness of breath.    [provider]  amLODipine (NORVASC) 2.5 MG tablet Take 2.5 mg by mouth daily. 02/19/20   [provider]  atorvastatin (LIPITOR) 40 MG tablet Take 40 mg by mouth at bedtime.    [provider]  budesonide-formoterol (SYMBICORT) 160-4.5 MCG/ACT inhaler Inhale 2 puffs into the lungs 2 (two) times daily as needed (shortness of breath).    [provider]  buPROPion (WELLBUTRIN XL) 150 MG 24 hr tablet Take 150 mg by mouth daily.    [provider]  clobetasol cream (TEMOVATE) 0.05 % Apply 2-3 x weekly to affected areas 08/09/22   Adline Potter, NP  DULoxetine (CYMBALTA) 60 MG capsule Take 60 mg by mouth 2 (two) times daily.     [provider]  estradiol (ESTRACE) 0.1 MG/GM vaginal cream USE 0.5 GM TWICE WEEKLY INTRAVAGINALLY 06/12/22   Adline Potter, NP  fluticasone (FLONASE) 50 MCG/ACT nasal spray Place 1 spray into both nostrils daily as needed for allergies or rhinitis.    [provider]  ibuprofen (ADVIL) 200 MG tablet Take 400 mg by mouth every 6 (six) hours as needed for moderate pain.    [provider]  loratadine (CLARITIN) 10 MG tablet Take 10 mg by mouth daily.  [provider]  losartan (COZAAR) 25 MG tablet Take 25 mg by mouth daily. 02/19/20   [provider]  montelukast (SINGULAIR) 10 MG tablet Take 10 mg by mouth at bedtime. 07/10/20   [provider]  Multiple Vitamins-Minerals (MULTIVITAMIN WITH MINERALS) tablet Take 1 tablet by mouth daily.    [provider]  omeprazole (PRILOSEC) 40 MG  capsule Take 40 mg by mouth daily.    [provider]  ondansetron (ZOFRAN) 4 MG tablet Take 1 tablet (4 mg total) by mouth every 6 (six) hours. Patient taking differently: Take 4 mg by mouth every 6 (six) hours as needed for vomiting or nausea. 03/29/22   Burgess Amor, PA-C  oxyCODONE-acetaminophen (PERCOCET/ROXICET) 5-325 MG tablet Take 1 tablet by mouth every 4 (four) hours as needed. 03/29/22   Burgess Amor, PA-C  phentermine 15 MG capsule Take 1 capsule (15 mg total) by mouth every morning. Patient not taking: Reported on 08/14/2022 05/17/22   Langston Reusing, MD  pramoxine-hydrocortisone (PROCTOCREAM-HC) 1-1 % rectal cream PLACE 1 APPLICATION RECTALLY 2 (TWO) TIMES DAILY. Patient taking differently: Place 1 Application rectally 2 (two) times daily as needed for hemorrhoids or anal itching. 05/22/22   Adline Potter, NP  tamsulosin (FLOMAX) 0.4 MG CAPS capsule Take 1 capsule (0.4 mg total) by mouth daily after supper. 03/29/22   Burgess Amor, PA-C  tamsulosin (FLOMAX) 0.4 MG CAPS capsule Take 1 capsule (0.4 mg total) by mouth daily. 06/19/22   Jerilee Field, MD  Vitamin D, Ergocalciferol, (DRISDOL) 1.25 MG (50000 UNIT) CAPS capsule Take 1 capsule (50,000 Units total) by mouth every 7 (seven) days. 07/10/22   Langston Reusing, MD      Allergies    Patient has no known allergies.    Review of Systems   Review of Systems  Cardiovascular:  Positive for chest pain.  Neurological:  Positive for headaches.  All other systems reviewed and are negative.   Physical Exam Updated Vital Signs BP 128/86   Pulse 86   Temp 99.2 F (37.3 C) (Oral)   Resp 16   LMP 12/12/2018   SpO2 94%  Physical Exam Vitals and nursing note reviewed.  Constitutional:      General: She is not in acute distress.    Appearance: She is well-developed.  HENT:     Head: Normocephalic and atraumatic.     Comments: No temporal tenderness bilaterally. Eyes:     Conjunctiva/sclera: Conjunctivae  normal.  Cardiovascular:     Rate and Rhythm: Normal rate and regular rhythm.     Heart sounds: No murmur heard. Pulmonary:     Effort: Pulmonary effort is normal. No respiratory distress.     Breath sounds: Normal breath sounds.  Abdominal:     Palpations: Abdomen is soft.     Tenderness: There is no abdominal tenderness.  Musculoskeletal:        General: No swelling.     Cervical back: Neck supple.  Skin:    General: Skin is warm and dry.     Capillary Refill: Capillary refill takes less than 2 seconds.  Neurological:     Mental Status: She is alert.     Comments: Alert and oriented to self, place, time and event.   Speech is fluent, clear without dysarthria or dysphasia.   Strength 5/5 in upper/lower extremities   Sensation intact in upper/lower extremities   Normal gait.  CN I not tested  CN II not tested CN III, IV, VI PERRLA  and EOMs intact bilaterally  CN V Intact sensation to sharp and light touch to the face  CN VII facial movements symmetric  CN VIII not tested  CN IX, X no uvula deviation, symmetric rise of soft palate  CN XI 5/5 SCM and trapezius strength bilaterally  CN XII Midline tongue protrusion, symmetric L/R movements     Psychiatric:        Mood and Affect: Mood normal.     ED Results / Procedures / Treatments   Labs (all labs ordered are listed, but only abnormal results are displayed) Labs Reviewed  BASIC METABOLIC PANEL - Abnormal; Notable for the following components:      Result Value   Potassium 3.4 (*)    Glucose, Bld 104 (*)    All other components within normal limits  CBC  TROPONIN I (HIGH SENSITIVITY)  TROPONIN I (HIGH SENSITIVITY)    EKG EKG Interpretation Date/Time:  Sunday February 11 2023 09:49:09 EDT Ventricular Rate:  110 PR Interval:  141 QRS Duration:  90 QT Interval:  353 QTC Calculation: 478 R Axis:   53  Text Interpretation: Sinus tachycardia Confirmed by Gloris Manchester ([id number]) on 02/11/2023 9:53:32  AM  Radiology CT Head Wo Contrast  Result Date: 02/11/2023 CLINICAL DATA:  Provided history: Headache, increasing frequency or severity. Additional history provided: The patient reports hypertension, left retro-orbital headache, chest pressure. EXAM: CT HEAD WITHOUT CONTRAST TECHNIQUE: Contiguous axial images were obtained from the base of the skull through the vertex without intravenous contrast. RADIATION DOSE REDUCTION: This exam was performed according to the departmental dose-optimization program which includes automated exposure control, adjustment of the mA and/or kV according to patient size and/or use of iterative reconstruction technique. COMPARISON:  Brain MRI 10/26/2022. FINDINGS: Brain: Cerebral volume is normal. A known pituitary adenoma is poorly reassessed on this non-contrast head CT. This measured 5 mm on the prior brain MRI of 10/26/2022. There is no acute intracranial hemorrhage. No demarcated cortical infarct. No extra-axial fluid collection. No midline shift. Vascular: No hyperdense vessel.  Atherosclerotic calcifications. Skull: No calvarial fracture or aggressive osseous lesion. Sinuses/Orbits: No mass or acute finding within the imaged orbits. No significant paranasal sinus disease at the imaged levels. IMPRESSION: 1. No evidence of acute intracranial hemorrhage or an acute infarct. 2. A known pituitary adenoma is poorly reassessed on this non-contrast head CT. This measured 5 mm on the prior brain MRI of 10/26/2022. Electronically Signed   By: Jackey Loge D.O.   On: 02/11/2023 10:55   DG Chest 2 View  Result Date: 02/11/2023 CLINICAL DATA:  Chest pressure.  Headache. EXAM: CHEST - 2 VIEW COMPARISON:  None Available. FINDINGS: Normal mediastinum and cardiac silhouette. Normal pulmonary vasculature. No evidence of effusion, infiltrate, or pneumothorax. No acute bony abnormality. Anterior cervical fusion IMPRESSION: No active cardiopulmonary disease. Electronically Signed   By:  Genevive Bi M.D.   On: 02/11/2023 10:48    Procedures Procedures    Medications Ordered in ED Medications  sodium chloride 0.9 % bolus 500 mL (0 mLs Intravenous Stopped 02/11/23 1338)  ketorolac (TORADOL) 15 MG/ML injection 15 mg (15 mg Intravenous Given 02/11/23 1021)  diphenhydrAMINE (BENADRYL) injection 25 mg (25 mg Intravenous Given 02/11/23 1021)  metoCLOPramide (REGLAN) injection 10 mg (10 mg Intravenous Given 02/11/23 1021)    ED Course/ Medical Decision Making/ A&P  Medical Decision Making Amount and/or Complexity of Data Reviewed Labs: ordered. Radiology: ordered.  Risk Prescription drug management.   This patient presents to the ED for concern of headache, chest pressure, this involves an extensive number of treatment options, and is a complaint that carries with it a high risk of complications and morbidity.  The differential diagnosis includes CVA, migraine/tension/cluster headache, cerebral thrombosis, malignancy, carotid/vertebral artery dissection, ACS, PE, anxiety, other   Co morbidities that complicate the patient evaluation  See HPI   Additional history obtained:  Additional history obtained from EMR External records from outside source obtained and reviewed including hospital records   Lab Tests:  I Ordered, and personally interpreted labs.  The pertinent results include: Mild hypokalemia but otherwise, the chest is normal limits.  No renal dysfunction.  No leukocytosis.  No evidence of anemia.  Platelets within range.  Initial troponin of 3 with repeat for   Imaging Studies ordered:  I ordered imaging studies including CT head, chest x-ray I independently visualized and interpreted imaging which showed  CT head: No acute intracranial abnormality Chest x-ray: No acute cardiopulmonary abnormality I agree with the radiologist interpretation   Cardiac Monitoring: / EKG:  The patient was maintained on a cardiac  monitor.  I personally viewed and interpreted the cardiac monitored which showed an underlying rhythm of: Sinus tachycardia   Consultations Obtained:  N/a   Problem List / ED Course / Critical interventions / Medication management  Headache I ordered medication including Reglan, Toradol, Benadryl, 5 cc normal saline   Reevaluation of the patient after these medicines showed that the patient resolved I have reviewed the patients home medicines and have made adjustments as needed   Social Determinants of Health:  Denies tobacco, illicit drug use   Test / Admission - Considered:  Headache Vitals signs within normal range and stable throughout visit. Laboratory/imaging studies significant for: See above 57 year old female presents emergency department with complaints of headache, chest pressure.  Regarding headache, seems to be retro-orbital in nature.  On exam, patient with no focal deficit.  Given differing headache from her usual refractory to over-the-counter medications, CT imaging was obtained which is negative for any acute intracranial abnormality.  Patient treated with migraine cocktail while in the ED and noted complete resolution of symptoms.  Suspect patient's symptoms are likely secondary to migraine type headache.  Regarding chest pressure/tightness, symptoms occurred and routed to emergency department for assessment of headache.  Patient initially thought symptoms were related to increased anxious feelings.  Patient today with delta negative troponin, lack of acute ischemic change on EKG; low suspicion for ACS.  Patient without chest pain rating to back, pulse deficits, significant hypertension; low suspicion for aortic dissection.  Patient with low PESI score; low suspicion for PE.  Chest x-ray without signs of pneumonia, pneumothorax or other acute cardiopulmonary abnormality.  As headache was treated with migraine cocktail, chest pain resolved.  Suspect that patient's symptoms  are likely secondary to anxiety.  Will recommend follow-up with primary care for further assessment of symptoms.  Treatment plan discussed at length with patient and she acknowledged understanding was agreeable to said plan.  Patient overall well-appearing, afebrile in no acute distress. Worrisome signs and symptoms were discussed with the patient, and the patient acknowledged understanding to return to the ED if noticed. Patient was stable upon discharge.          Final Clinical Impression(s) / ED Diagnoses Final diagnoses:  Acute nonintractable headache, unspecified headache type  Rx / DC Orders ED Discharge Orders     None         Peter Garter, [place] 02/11/23 Vinnie Langton    Gloris Manchester, MD 02/12/23 [phone]

## 2023-02-11 NOTE — Discharge Instructions (Addendum)
As discussed, workup today overall reassuring.  CT imaging of your head did not show any obvious abnormality.  Your heart enzymes were normal.  Your labs showed slightly decreased potassium otherwise were normal.  EKG appeared normal.  Chest x-ray appeared normal.  Suspect your headache is likely secondary to migraine type headache and just a different kind of headache from what you are used to.  Recommend follow-up with primary care for reassessment.  Please not hesitate to return to emergency department for worrisome signs and symptoms we discussed become apparent.

## 2023-02-11 NOTE — ED Triage Notes (Signed)
Pt c/o htn since yesterday with headache behind left eye. States started having chest pressure this am and thinks its just her anxiety. Took motrin this am and helped some with headache. Takes htn meds and compliant. Arrived a/o. Color wnl. No neuro deficits noted. Denies vision changes. Denies n/v/trouble speaking or weakness on one side.

## 2023-04-03 ENCOUNTER — Other Ambulatory Visit (HOSPITAL_COMMUNITY): Payer: Self-pay | Admitting: Neurological Surgery

## 2023-04-03 DIAGNOSIS — R1310 Dysphagia, unspecified: Secondary | ICD-10-CM

## 2023-04-19 ENCOUNTER — Ambulatory Visit (HOSPITAL_COMMUNITY)
Admission: RE | Admit: 2023-04-19 | Discharge: 2023-04-19 | Disposition: A | Discharge status: Home / Self Care | Payer: No Typology Code available for payment source | Source: Ambulatory Visit | Attending: Family Medicine | Admitting: Family Medicine

## 2023-04-19 DIAGNOSIS — R1310 Dysphagia, unspecified: Secondary | ICD-10-CM | POA: Insufficient documentation

## 2023-04-19 DIAGNOSIS — R490 Dysphonia: Secondary | ICD-10-CM | POA: Insufficient documentation

## 2023-04-19 NOTE — Progress Notes (Signed)
Modified Barium Swallow Study  Patient Details  Name: Kendria Raybould MRN: [id number] Date of Birth: 1966/01/28  Today's Date: 04/19/2023  Modified Barium Swallow completed.  Full report located under Chart Review in the Imaging Section.  History of Present Illness Pt is a 57 YO F 3 months out from ACDF surgery. Since surgery pt with obstructive dysphagia complaints. Currently utilizing small bites/sips, liquid wash, and thorough mastication to ease PO intake. Also c/o dysphonia. Voice c/b mild horaseness. Per pt, she has been unable to sing per baseline prior to surgery.   Clinical Impression Pt participated in MBSS to objectively assess swallow function and efficacy with range of boluses. Pt demonstrated functional and safe oropharyngeal swallow with liquids and solids. Oral phase c/b timely and efficient mastication and brisk tongue motion for A-P transit of boluses into pharynx. Pharyngeal swallow initiated at level of valleculae. Adequate airway protection achieved via WNL hyolaryngeal excursion and full epiglottic inversion. Trace residue in valleculae with mildly and moderately thick liquids. No residue noted with thin liquids or solid textures. Pt was observed to clear throat x1 during examination, no airway invasion observed at this time. UES distension is adequate for allowing passage of entirety of bolus. Esophageal sweep reveals brief statis in mid-esophagus. Pt noted to take x3 drinks of thin liquid without cues, pill did end up clearing the esophagus without need for cued strategy from SLP. Suggest pt continue with regular diet and thin liquids. Recommend pt continue utilizing strategies in which she feels benefit such as liquid wash, small bites, thorough mastication. Suspect that pt has some deviations in sensation which are still resolving s/p surgery. Education provided on results and recommendations, pt verbalizes understanding. No impairments noted during evaluation; thus no ST  is indicated for dysphagia. Pt may benefit from ST to address dysphonia, if does not resolve to baseline. Factors that may increase risk of adverse event in presence of aspiration Rubye Oaks & Clearance Coots 2021):  (none noted)  Swallow Evaluation Recommendations Recommendations: PO diet PO Diet Recommendation: Regular;Thin liquids (Level 0) Liquid Administration via: Cup;Straw Medication Administration: Whole meds with liquid Supervision: Patient able to self-feed Swallowing strategies  : Slow rate;Small bites/sips;Follow solids with liquids Postural changes: Stay upright 30-60 min after meals Oral care recommendations: Oral care BID (2x/day)      Maia Breslow 04/19/2023,12:25 PM

## 2023-07-12 ENCOUNTER — Other Ambulatory Visit: Payer: Self-pay | Admitting: Adult Health

## 2023-07-27 ENCOUNTER — Ambulatory Visit (HOSPITAL_COMMUNITY)
Admission: RE | Admit: 2023-07-27 | Discharge: 2023-07-27 | Disposition: A | Discharge status: Home / Self Care | Payer: No Typology Code available for payment source | Source: Ambulatory Visit | Attending: Urology | Admitting: Urology

## 2023-07-27 DIAGNOSIS — N201 Calculus of ureter: Secondary | ICD-10-CM | POA: Diagnosis present

## 2023-07-27 DIAGNOSIS — N200 Calculus of kidney: Secondary | ICD-10-CM | POA: Diagnosis present

## 2023-08-20 ENCOUNTER — Ambulatory Visit (INDEPENDENT_AMBULATORY_CARE_PROVIDER_SITE_OTHER): Admitting: Urology

## 2023-08-20 ENCOUNTER — Encounter: Payer: Self-pay | Admitting: Urology

## 2023-08-20 VITALS — BP 148/93 | HR 96

## 2023-08-20 DIAGNOSIS — N200 Calculus of kidney: Secondary | ICD-10-CM | POA: Diagnosis not present

## 2023-08-20 LAB — URINALYSIS, ROUTINE W REFLEX MICROSCOPIC
Bilirubin, UA: NEGATIVE
Glucose, UA: NEGATIVE
Ketones, UA: NEGATIVE
Leukocytes,UA: NEGATIVE
Nitrite, UA: NEGATIVE
Protein,UA: NEGATIVE
RBC, UA: NEGATIVE
Specific Gravity, UA: <1.005 — ABNORMAL LOW (ref 1.005–1.030)
Urobilinogen, Ur: 0.2 mg/dL (ref 0.2–1.0)
pH, UA: 7 (ref 5.0–7.5)

## 2023-08-20 NOTE — Progress Notes (Unsigned)
 08/20/2023 2:10 PM   Nathaneil Canary [birth year] [id number]  Referring provider: Richardean Chimera, MD [address] [place],  [place] [address]  No chief complaint on file.   HPI:  F/u -   1) left ureteral stone - she had left flank pain and a 03/29/2022 CT revealed a 6 mm left proximal stone. It was difficult to see on the scout. Her WBC was 9.6, Cr 1.27. UA > 50 wbc, no rbc. On CT also renal stones - bilateral L>R renal stones about 4 mm on CT 03/29/2022.    She underwent left ESWL Dec 2023 and required left URS/HLL and stent 06/27/2022 for a fragment as well as treatment of 3-4 left lower pole stones and 2 midpole stones - all dusted.    On August 07, 2022 renal ultrasound revealed no  hydronephrosis.  No echogenic foci noted.   Today, seen for the above. No stone passage or flank pain. No dysuria or gross hematuria. Mar 2025 renal US - benign. No hydro. 3-4 mm LLP stone possible.   UA clear.   PMH: Past Medical History:  Diagnosis Date   Anginal pain (HCC)    Anxiety    Asthma    Back pain    Bilateral swelling of feet    Depression    hitory of   GERD (gastroesophageal reflux disease)    Headache    History of kidney stones    Hyperlipidemia    Hypertension    Joint pain    LGSIL of cervix of undetermined significance 03/20/2022   03/20/22 will get colpo   Pre-diabetes    Seasonal allergies    Sleep apnea    SOB (shortness of breath)    Vitamin D deficiency    Vitiligo     Surgical History: Past Surgical History:  Procedure Laterality Date   APPENDECTOMY     1975   CARDIAC CATHETERIZATION N/A 12/31/2015   Procedure: Left Heart Cath and Coronary Angiography;  Surgeon: Corky Crafts, MD;  Location: [facility] IN[facility]ASIVE CV [facility];  Service: Cardiovascular;  Laterality: N/A;   CESAREAN SECTION W/BTL  05/16/2003   CHOLECYSTECTOMY  11/27/2011   Procedure: LAPAROSCOPIC CHOLECYSTECTOMY;  Surgeon: Dalia Heading, MD;  Location: [facility];  Service: General;  Laterality:  N/A;   COLONOSCOPY  03/19/2012   Procedure: COLONOSCOPY;  Surgeon: Dalia Heading, MD;  Location: [facility];  Service: Gastroenterology;  Laterality: N/A;   COLONOSCOPY N/A 10/26/2020   Procedure: COLONOSCOPY;  Surgeon: Franky Macho, MD;  Location: [facility];  Service: Gastroenterology;  Laterality: N/A;   COLPOSCOPY  01/11/2015   CYSTOSCOPY WITH RETROGRADE PYELOGRAM, URETEROSCOPY AND STENT PLACEMENT Left 06/27/2022   Procedure: CYSTOSCOPY WITH LEFT RETROGRADE  LEFT URETEROSCOPY HOLMIUM LASER  AND STENT PLACEMENT;  Surgeon: Jerilee Field, MD;  Location: [facility];  Service: Urology;  Laterality: Left;  60 MINS FOR CASE   DILATION AND CURETTAGE OF UTERUS     ENDOMETRIAL BIOPSY  01/14/2019   EXTRACORPOREAL SHOCK WAVE LITHOTRIPSY Left 05/04/2022   Procedure: LEFT EXTRACORPOREAL SHOCK WAVE LITHOTRIPSY (ESWL);  Surgeon: Alfredo Martinez, MD;  Location[facility];  Service: Urology;  Laterality: Left;   LAPAROSCOPIC SALPINGO OOPHERECTOMY Bilateral 01/22/2019   Procedure: LAPAROSCOPIC SALPINGO OOPHORECTOMY;  Surgeon: [name], MD;  Location: [facility];  Service: Gynecology;  Laterality: Bilateral;   TUBAL LIGATION      Home Medications:  Allergies as of 08/20/2023   No Known Allergies  Medication List        Accurate as of August 20, 2023  2:10 PM. If you have any questions, ask your nurse or doctor.          acetaminophen 500 MG tablet Commonly known as: TYLENOL Take 1,000 mg by mouth every 6 (six) hours as needed for moderate pain.   acidophilus Caps capsule Take 1 capsule by mouth daily.   albuterol 108 (90 Base) MCG/ACT inhaler Commonly known as: VENTOLIN HFA Inhale into the lungs every 6 (six) hours as needed for wheezing or shortness of breath.   amLODipine 2.5 MG tablet Commonly known as: NORVASC Take 2.5 mg by mouth daily.   atorvastatin 40 MG tablet Commonly known as: LIPITOR Take 40 mg by mouth at bedtime.   budesonide-formoterol  160-4.5 MCG/ACT inhaler Commonly known as: SYMBICORT Inhale 2 puffs into the lungs 2 (two) times daily as needed (shortness of breath).   buPROPion 150 MG 24 hr tablet Commonly known as: WELLBUTRIN XL Take 150 mg by mouth daily.   clobetasol cream 0.05 % Commonly known as: TEMOVATE APPLY 2-3 X WEEKLY TO AFFECTED AREAS   DULoxetine 60 MG capsule Commonly known as: CYMBALTA Take 60 mg by mouth 2 (two) times daily.   estradiol 0.1 MG/GM vaginal cream Commonly known as: ESTRACE USE 0.5 GM TWICE WEEKLY INTRAVAGINALLY   fluticasone 50 MCG/ACT nasal spray Commonly known as: FLONASE Place 1 spray into both nostrils daily as needed for allergies or rhinitis.   ibuprofen 200 MG tablet Commonly known as: ADVIL Take 400 mg by mouth every 6 (six) hours as needed for moderate pain.   loratadine 10 MG tablet Commonly known as: CLARITIN Take 10 mg by mouth daily.   losartan 25 MG tablet Commonly known as: COZAAR Take 25 mg by mouth daily.   montelukast 10 MG tablet Commonly known as: SINGULAIR Take 10 mg by mouth at bedtime.   multivitamin with minerals tablet Take 1 tablet by mouth daily.   omeprazole 40 MG capsule Commonly known as: PRILOSEC Take 40 mg by mouth daily.   ondansetron 4 MG tablet Commonly known as: ZOFRAN Take 1 tablet (4 mg total) by mouth every 6 (six) hours. What changed:  when to take this reasons to take this   oxyCODONE-acetaminophen 5-325 MG tablet Commonly known as: PERCOCET/ROXICET Take 1 tablet by mouth every 4 (four) hours as needed.   phentermine 15 MG capsule Take 1 capsule (15 mg total) by mouth every morning.   pramoxine-hydrocortisone 1-1 % rectal cream Commonly known as: PROCTOCREAM-HC PLACE 1 APPLICATION RECTALLY 2 (TWO) TIMES DAILY. What changed:  when to take this reasons to take this   tamsulosin 0.4 MG Caps capsule Commonly known as: FLOMAX Take 1 capsule (0.4 mg total) by mouth daily after supper.   tamsulosin 0.4 MG Caps  capsule Commonly known as: FLOMAX Take 1 capsule (0.4 mg total) by mouth daily.   Vitamin D (Ergocalciferol) 1.25 MG (50000 UNIT) Caps capsule Commonly known as: DRISDOL Take 1 capsule (50,000 Units total) by mouth every 7 (seven) days.        Allergies: No Known Allergies  Family History: Family History  Problem Relation Age of Onset   Cancer Mother        bone brain cervical   Hyperlipidemia Father    Cancer Father        colon   Diabetes Father    Hypertension Father    Cancer Maternal Aunt  breast   Cancer Maternal Aunt        breast   Cancer Maternal Aunt        breast   Cancer Paternal Aunt        esophageal, lung    Social History:  reports that she has never smoked. She has never used smokeless tobacco. She reports current alcohol use. She reports that she does not use drugs.   Physical Exam: LMP 12/12/2018   Constitutional:  Alert and oriented, No acute distress. HEENT: [place] AT, moist mucus membranes.  Trachea midline, no masses. Cardiovascular: No clubbing, cyanosis, or edema. Respiratory: Normal respiratory effort, no increased work of breathing. GI: Abdomen is soft, nontender, nondistended, no abdominal masses GU: No CVA tenderness Skin: No rashes, bruises or suspicious lesions. Neurologic: Grossly intact, no focal deficits, moving all 4 extremities. Psychiatric: Normal mood and affect.  Laboratory Data: Lab Results  Component Value Date   WBC 5.8 02/11/2023   HGB 14.0 02/11/2023   HCT 40.9 02/11/2023   MCV 84.5 02/11/2023   PLT 265 02/11/2023    Lab Results  Component Value Date   CREATININE 0.76 02/11/2023    No results found for: "PSA"  No results found for: "TESTOSTERONE"  Lab Results  Component Value Date   HGBA1C 5.7 (H) 07/10/2022    Urinalysis    Component Value Date/Time   COLORURINE YELLOW 03/29/2022 1515   APPEARANCEUR Clear 08/14/2022 0921   LABSPEC 1.023 03/29/2022 1515   PHURINE 6.0 03/29/2022 1515    GLUCOSEU Negative 08/14/2022 0921   HGBUR NEGATIVE 03/29/2022 1515   BILIRUBINUR Negative 08/14/2022 0921   KETONESUR NEGATIVE 03/29/2022 1515   PROTEINUR Negative 08/14/2022 0921   PROTEINUR 30 (A) 03/29/2022 1515   NITRITE Negative 08/14/2022 0921   NITRITE NEGATIVE 03/29/2022 1515   LEUKOCYTESUR Trace (A) 08/14/2022 0921   LEUKOCYTESUR LARGE (A) 03/29/2022 1515    Lab Results  Component Value Date   LABMICR See below: 08/14/2022   WBCUA 0-5 08/14/2022   LABEPIT 0-10 08/14/2022   BACTERIA None seen 08/14/2022    Pertinent Imaging: CT 2023  Results for orders placed during the hospital encounter of 05/29/22  DG Abd 1 View  Narrative CLINICAL DATA:  Status post extracorporeal shock wave lithotripsy  EXAM: ABDOMEN - 1 VIEW  COMPARISON:  Abdominal radiograph dated 05/04/2022  FINDINGS: Nonobstructive bowel gas pattern. No free air or pneumatosis. Moderate volume stool throughout the colon. Multifocal radiodensities project over the left kidney in keeping with nephrolithiasis. Ovoid radiodensity projecting over the medial left lower quadrant at the level of L4-5 measures 5 mm and may reflect a mid left ureteral stone. No acute or substantial osseous abnormality. The sacrum and coccyx are partially obscured by overlying bowel contents. Right upper quadrant surgical clips.  IMPRESSION: 1. Multifocal radiodensities project over the left kidney in keeping with nephrolithiasis. 2. Ovoid radiodensity projecting over the medial left lower quadrant at the level of L4-5 measures 5 mm and may reflect a mid left ureteral stone.   Electronically Signed By: Agustin Cree M.D. On: 05/29/2022 09:40  No results found for this or any previous visit.  No results found for this or any previous visit.  No results found for this or any previous visit.  Results for orders placed during the hospital encounter of 07/27/23  US RENAL  Narrative CLINICAL DATA:  Follow-up S wall and  cystoscopy.  EXAM: RENAL / URINARY TRACT ULTRASOUND COMPLETE  COMPARISON:  August 07, 2022  FINDINGS: Right Kidney:  Renal measurements: 11.3 x 5.6 x 5.1 cm = volume: 169 mL. Echogenicity within normal limits. No mass or hydronephrosis visualized.  Left Kidney:  Renal measurements: 9.8 x 6 x 5 cm = volume: 151.9 mL. 4 mm nonobstructing stone noted in the lower pole left kidney. Echogenicity within normal limits. No mass or hydronephrosis visualized.  Bladder:  Appears normal for degree of bladder distention. Bilateral ureteral jets are noted.  Other:  Simple cysts are identified in the left lobe liver largest measures 3.2 x 2.8 x 2.2 cm. No follow-up is recommended.  IMPRESSION: 1. No acute abnormality identified. 2. 4 mm nonobstructing stone noted in the lower pole left kidney.   Electronically Signed By: Sherian Rein M.D. On: 08/06/2023 10:36  No results found for this or any previous visit.  No results found for this or any previous visit.  Results for orders placed during the hospital encounter of 03/29/22  CT Renal Stone Study  Narrative CLINICAL DATA:  Abdominal pain, left flank pain  EXAM: CT ABDOMEN AND PELVIS WITHOUT CONTRAST  TECHNIQUE: Multidetector CT imaging of the abdomen and pelvis was performed following the standard protocol without IV contrast.  RADIATION DOSE REDUCTION: This exam was performed according to the departmental dose-optimization program which includes automated exposure control, adjustment of the mA and/or kV according to patient size and/or use of iterative reconstruction technique.  COMPARISON:  None Available.  FINDINGS: Lower chest: Small linear densities are seen in left lower lobe suggesting scarring or minimal subsegmental atelectasis.  Hepatobiliary: There are multiple low-density lesions of varying sizes largest measuring 4.9 cm. There is fluid attenuation in these lesions suggesting possible hepatic cysts.  There is no dilation of bile ducts. Surgical clips are seen in gallbladder fossa.  Pancreas: No focal abnormalities are seen.  Spleen: Unremarkable.  Adrenals/Urinary Tract: Adrenals are unremarkable. There is mild left hydronephrosis. There is 8 mm calculus in the proximal left ureter at L3-L4 level. There are multiple bilateral renal stones, more so in the left kidney. There is mild left perinephric stranding. Distal left ureter is not dilated. Urinary bladder is unremarkable.  Stomach/Bowel: Stomach is unremarkable. Small bowel loops are not dilated. Appendix is not seen. There is no significant wall thickening in colon. Moderate to large amount of stool is seen in proximal colon. There is no fecal impaction in rectum. Few diverticula are seen in colon without signs of focal acute diverticulitis.  Vascular/Lymphatic: Scattered arterial calcifications are seen.  Reproductive: Uterus is retroverted.  There are no adnexal masses.  Other: There is no ascites or pneumoperitoneum. Umbilical hernia containing fat is seen. Bilateral inguinal hernias containing fat are seen, larger on the left side.  Musculoskeletal: Degenerative changes are noted in lumbar spine, more so at the L2-L3 level.  IMPRESSION: There is 8 mm calculus in the proximal left ureter close to the ureteropelvic junction causing mild left hydronephrosis. Bilateral renal stones, more so in the left kidney.  There is no evidence of intestinal obstruction or pneumoperitoneum.  Multiple hepatic cysts. Few diverticula are seen in colon without signs of focal acute diverticulitis. Moderate to large amount of stool is seen in proximal colon.  Other findings as described in the body of the report.   Electronically Signed By: Ernie Avena M.D. On: 03/29/2022 17:57   Assessment & Plan:    Kidney stone - looks good. Drinking more water.   No follow-ups on file.  Jerilee Field, MD  [facility] [place]  [address]  [place], [place] [address] [phone]

## 2023-09-02 ENCOUNTER — Other Ambulatory Visit: Payer: Self-pay | Admitting: Adult Health
